# Patient Record
Sex: Female | Born: 1965 | Race: White | Hispanic: No | Marital: Married | Smoking: Never smoker
Health system: Southern US, Community
[De-identification: ages and names within clinical notes are randomized; demographics above are authoritative.]

## PROBLEM LIST (undated history)

## (undated) DIAGNOSIS — R1011 Right upper quadrant pain: Secondary | ICD-10-CM

## (undated) DIAGNOSIS — K52832 Lymphocytic colitis: Secondary | ICD-10-CM

## (undated) DIAGNOSIS — F329 Major depressive disorder, single episode, unspecified: Secondary | ICD-10-CM

## (undated) DIAGNOSIS — C801 Malignant (primary) neoplasm, unspecified: Secondary | ICD-10-CM

## (undated) DIAGNOSIS — F419 Anxiety disorder, unspecified: Secondary | ICD-10-CM

## (undated) DIAGNOSIS — I10 Essential (primary) hypertension: Secondary | ICD-10-CM

## (undated) DIAGNOSIS — Z9289 Personal history of other medical treatment: Secondary | ICD-10-CM

## (undated) DIAGNOSIS — M1990 Unspecified osteoarthritis, unspecified site: Secondary | ICD-10-CM

## (undated) DIAGNOSIS — D649 Anemia, unspecified: Secondary | ICD-10-CM

## (undated) DIAGNOSIS — K219 Gastro-esophageal reflux disease without esophagitis: Secondary | ICD-10-CM

## (undated) DIAGNOSIS — N6019 Diffuse cystic mastopathy of unspecified breast: Secondary | ICD-10-CM

## (undated) DIAGNOSIS — B019 Varicella without complication: Secondary | ICD-10-CM

## (undated) DIAGNOSIS — R011 Cardiac murmur, unspecified: Secondary | ICD-10-CM

## (undated) DIAGNOSIS — N920 Excessive and frequent menstruation with regular cycle: Secondary | ICD-10-CM

## (undated) DIAGNOSIS — D259 Leiomyoma of uterus, unspecified: Secondary | ICD-10-CM

## (undated) DIAGNOSIS — F32A Depression, unspecified: Secondary | ICD-10-CM

## (undated) HISTORY — PX: WISDOM TOOTH EXTRACTION: SHX21

## (undated) HISTORY — DX: Anemia, unspecified: D64.9

## (undated) HISTORY — DX: Depression, unspecified: F32.A

## (undated) HISTORY — DX: Leiomyoma of uterus, unspecified: D25.9

## (undated) HISTORY — DX: Diffuse cystic mastopathy of unspecified breast: N60.19

## (undated) HISTORY — DX: Cardiac murmur, unspecified: R01.1

## (undated) HISTORY — DX: Major depressive disorder, single episode, unspecified: F32.9

## (undated) HISTORY — PX: DILATION AND CURETTAGE OF UTERUS: SHX78

## (undated) HISTORY — DX: Excessive and frequent menstruation with regular cycle: N92.0

## (undated) HISTORY — DX: Anxiety disorder, unspecified: F41.9

## (undated) HISTORY — DX: Varicella without complication: B01.9

## (undated) HISTORY — DX: Essential (primary) hypertension: I10

## (undated) HISTORY — PX: HYSTEROSCOPY: SHX211

## (undated) HISTORY — DX: Personal history of other medical treatment: Z92.89

---
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
## 2003-05-18 HISTORY — PX: BREAST EXCISIONAL BIOPSY: SUR124

## 2006-05-17 HISTORY — PX: ABLATION: SHX5711

## 2006-12-22 ENCOUNTER — Ambulatory Visit: Payer: Self-pay | Admitting: Unknown Physician Specialty

## 2008-11-06 ENCOUNTER — Ambulatory Visit: Payer: Self-pay | Admitting: Family

## 2009-05-17 DIAGNOSIS — N6019 Diffuse cystic mastopathy of unspecified breast: Secondary | ICD-10-CM

## 2009-05-17 HISTORY — DX: Diffuse cystic mastopathy of unspecified breast: N60.19

## 2011-08-25 HISTORY — PX: COLONOSCOPY: SHX174

## 2014-05-27 ENCOUNTER — Encounter: Payer: Self-pay | Admitting: General Surgery

## 2014-06-06 ENCOUNTER — Ambulatory Visit: Payer: Self-pay

## 2014-06-17 ENCOUNTER — Encounter: Payer: Self-pay | Admitting: General Surgery

## 2014-06-17 ENCOUNTER — Ambulatory Visit (INDEPENDENT_AMBULATORY_CARE_PROVIDER_SITE_OTHER): Payer: BC Managed Care – PPO | Admitting: General Surgery

## 2014-06-17 VITALS — BP 122/74 | HR 76 | Resp 12 | Ht 62.0 in | Wt 116.0 lb

## 2014-06-17 DIAGNOSIS — N6001 Solitary cyst of right breast: Secondary | ICD-10-CM

## 2014-06-17 NOTE — Progress Notes (Signed)
Patient ID: Carly Roberts, female   DOB: 1965-11-18, 49 y.o.   MRN: [id number]  Chief Complaint  Patient presents with  . Follow-up    mammogram    HPI Carly Roberts is a 49 y.o. female  Here for palpated lump in right breast that she first noticed in December. It seems to be decreasing in size. She reports some occasional  mild discomfort, primarily with direct pressure.  This has not interfered with her activity.   Her last mammogram was done 06/06/14, Cat 2.   HPI  No past medical history on file.  Past Surgical History  Procedure Laterality Date  . Ablation  2007  . Breast biopsy Right 2005    Fibroadenoma, duct adenoma, right breast or o'clock.    No family history on file.  Social History History  Substance Use Topics  . Smoking status: Never Smoker   . Smokeless tobacco: Never Used  . Alcohol Use: No    Allergies  Allergen Reactions  . Sulfa Antibiotics Rash    Current Outpatient Prescriptions  Medication Sig Dispense Refill  . b complex vitamins tablet Take 1 tablet by mouth daily.    . Multiple Minerals-Vitamins (CALCIUM & VIT D3 BONE HEALTH PO) Take by mouth.    . Probiotic Product (PROBIOTIC DAILY PO) Take by mouth.     No current facility-administered medications for this visit.    Review of Systems Review of Systems  Constitutional: Negative.   Respiratory: Negative.   Cardiovascular: Negative.     Blood pressure 122/74, pulse 76, resp. rate 12, height 5\' 2"  (1.575 m), weight 116 lb (52.617 kg).  Physical Exam Physical Exam  Constitutional: She is oriented to person, place, and time. She appears well-developed and well-nourished.  Eyes: Conjunctivae are normal. No scleral icterus.  Neck: Neck supple.  Cardiovascular: Normal rate, regular rhythm and normal heart sounds.   Pulmonary/Chest: Effort normal and breath sounds normal. Right breast exhibits no inverted nipple, no mass, no nipple discharge, no skin change and no tenderness. Left  breast exhibits no inverted nipple, no mass, no nipple discharge, no skin change and no tenderness.    Right breast well healed circumreal incision from 4-7 o'clock. Right breast little thickening from 11-1 o'clock from the areola.    Lymphadenopathy:    She has no cervical adenopathy.  Neurological: She is alert and oriented to person, place, and time.    Data Reviewed  PCP notes of 05/24/2014 from [facility],  Diagnostic mammograms dated 06/06/2014 shows a well-circumscribed mass in the right breast with another less dominant masses noted bilaterally.  Ultrasound examination of the right breast with the largest measuring 3.3 x 1.7 x 3.1 cm in the retroareolar area of the right breast. BI-RADS-2-2.  Assessment    Breast cyst, minimally symptomatic.    Plan    Options for management reviewed: 1) observation versus 2) aspiration. As she is experienced gradual improvement in its size and discomfort over the last month option 1 is chosen.  She was encouraged to call if the area becomes more uncomfortable or changes significantly in size. Follow up otherwise will be on an as-needed basis.    Ref: [facility]/ Dr Kenton Kingfisher PCP: None  [name] 06/18/2014, 11:58 AM

## 2014-06-17 NOTE — Patient Instructions (Addendum)
Continue self breast exams. Call office for any new breast issues or concerns.Breast Cyst A breast cyst is a sac in the breast that is filled with fluid. Breast cysts are common in women. Women can have one or many cysts. When the breasts contain many cysts, it is usually due to a noncancerous (benign) condition called fibrocystic change. These lumps form under the influence of female hormones (estrogen and progesterone). The lumps are most often located in the upper, outer portion of the breast. They are often more swollen, painful, and tender before your period starts. They usually disappear after menopause, unless you are on hormone therapy.  There are several types of cysts:  Macrocyst. This is a cyst that is about 2 in. (5.1 cm) in diameter.   Microcyst. This is a tiny cyst that you cannot feel but can be seen with a mammogram or an ultrasound.   Galactocele. This is a cyst containing milk that may develop if you suddenly stop breastfeeding.   Sebaceous cyst of the skin. This type of cyst is not in the breast tissue itself. Breast cysts do not increase your risk of breast cancer. However, they must be monitored closely because they can be cancerous.  CAUSES  It is not known exactly what causes a breast cyst to form. Possible causes include:  An overgrowth of milk glands and connective tissue in the breast can block the milk glands, causing them to fill with fluid.   Scar tissue in the breast from previous surgery may block the glands, causing a cyst.  RISK FACTORS Estrogen may influence the development of a breast cyst.  SIGNS AND SYMPTOMS   Feeling a smooth, round, soft lump (like a grape) in the breast that is easily moveable.   Breast discomfort or pain.  Increase in size of the lump before your menstrual period and decrease in its size after your menstrual period.  DIAGNOSIS  A cyst can be felt during a physical exam by your health care provider. A breast X-ray exam  (mammogram) and ultrasonography will be done to confirm the diagnosis. Fluid may be removed from the cyst with a needle (fine needle aspiration) to make sure the cyst is not cancerous.  TREATMENT  Treatment may not be necessary. Your health care provider may monitor the cyst to see if it goes away on its own. If treatment is needed, it may include:  Hormone treatment.   Needle aspiration. There is a chance of the cyst coming back after aspiration.   Surgery to remove the whole cyst.  HOME CARE INSTRUCTIONS   Keep all follow-up appointments with your health care provider.  See your health care provider regularly:  Get a yearly exam by your health care provider.  Have a clinical breast exam by a health care provider every 1-3 years if you are 75-78 years of age. After age 63 years, you should have the exam every year.   Get mammogram tests as directed by your health care provider.   Understand the normal appearance and feel of your breasts and perform breast self-exams.   Only take over-the-counter or prescription medicines as directed by your health care provider.   Wear a supportive bra, especially when exercising.   Avoid caffeine.   Reduce your salt intake, especially before your menstrual period. Too much salt can cause fluid retention, breast swelling, and discomfort.  SEEK MEDICAL CARE IF:   You feel, or think you feel, a lump in your breast.   You notice  that both breasts look or feel different than usual.   Your breast is still causing pain after your menstrual period is over.   You need medicine for breast pain and swelling that occurs with your menstrual period.  SEEK IMMEDIATE MEDICAL CARE IF:   You have severe pain, tenderness, redness, or warmth in your breast.   You have nipple discharge or bleeding.   Your breast lump becomes hard and painful.   You find new lumps or bumps that were not there before.   You feel lumps in your armpit  (axilla).   You notice dimpling or wrinkling of the breast or nipple.   You have a fever.  MAKE SURE YOU:  Understand these instructions.  Will watch your condition.  Will get help right away if you are not doing well or get worse. Document Released: 05/03/2005 Document Revised: 01/03/2013 Document Reviewed: 11/30/2012 [facility] Patient Information 2015 [place], [place]. This information is not intended to replace advice given to you by your health care provider. Make sure you discuss any questions you have with your health care provider.

## 2014-06-18 ENCOUNTER — Encounter: Payer: Self-pay | Admitting: General Surgery

## 2014-06-18 DIAGNOSIS — N6009 Solitary cyst of unspecified breast: Secondary | ICD-10-CM | POA: Insufficient documentation

## 2015-05-27 DIAGNOSIS — Z9289 Personal history of other medical treatment: Secondary | ICD-10-CM

## 2015-05-27 HISTORY — DX: Personal history of other medical treatment: Z92.89

## 2015-12-23 ENCOUNTER — Encounter: Payer: Self-pay | Admitting: *Deleted

## 2015-12-30 ENCOUNTER — Ambulatory Visit (INDEPENDENT_AMBULATORY_CARE_PROVIDER_SITE_OTHER): Payer: BC Managed Care – PPO | Admitting: General Surgery

## 2015-12-30 ENCOUNTER — Other Ambulatory Visit: Payer: Self-pay

## 2015-12-30 ENCOUNTER — Encounter: Payer: Self-pay | Admitting: General Surgery

## 2015-12-30 VITALS — BP 98/60 | HR 60 | Resp 12 | Ht 62.0 in | Wt 113.0 lb

## 2015-12-30 DIAGNOSIS — N6001 Solitary cyst of right breast: Secondary | ICD-10-CM

## 2015-12-30 DIAGNOSIS — N6002 Solitary cyst of left breast: Secondary | ICD-10-CM

## 2015-12-30 NOTE — Progress Notes (Signed)
Patient ID: Carly Roberts, female   DOB: 18-Sep-1965, 50 y.o.   MRN: [id number]  Chief Complaint  Patient presents with  . Follow-up    breast cyst    HPI Carly Roberts is a 50 y.o. female.  Here today for a follow up right breast cyst. She states she can feel another cyst in the left breast as well. She states the right breast tenderness/pain would come and go but for the past several months seems to be more constant. Occasion shooting pains for several weeks. Both areas are located on the top portion of the breast. Denies change in diet or caffeine intake.   HPI  No past medical history on file.  Past Surgical History:  Procedure Laterality Date  . ABLATION  2007  . BREAST BIOPSY Right 2005   Fibroadenoma, duct adenoma, right breast or o'clock.    No family history on file.  Social History Social History  Substance Use Topics  . Smoking status: Never Smoker  . Smokeless tobacco: Never Used  . Alcohol use No    Allergies  Allergen Reactions  . Sulfa Antibiotics Rash    Current Outpatient Prescriptions  Medication Sig Dispense Refill  . b complex vitamins tablet Take 1 tablet by mouth daily.    . Multiple Minerals-Vitamins (CALCIUM & VIT D3 BONE HEALTH PO) Take by mouth.    . Probiotic Product (PROBIOTIC DAILY PO) Take by mouth.     No current facility-administered medications for this visit.     Review of Systems Review of Systems  Constitutional: Negative.   Respiratory: Negative.   Cardiovascular: Negative.     Blood pressure 98/60, pulse 60, resp. rate 12, height 5\' 2"  (1.575 m), weight 113 lb (51.3 kg).  Physical Exam Physical Exam  Constitutional: She is oriented to person, place, and time. She appears well-developed and well-nourished.  HENT:  Mouth/Throat: Oropharynx is clear and moist.  Eyes: Conjunctivae are normal. No scleral icterus.  Neck: Neck supple.  Cardiovascular: Normal rate, regular rhythm and normal heart sounds.    Pulmonary/Chest: Effort normal and breath sounds normal. Right breast exhibits mass. Right breast exhibits no inverted nipple, no nipple discharge, no skin change and no tenderness. Left breast exhibits mass. Left breast exhibits no inverted nipple, no nipple discharge, no skin change and no tenderness.    1.5 left upper inner quadrant and a 3 cm cyst right upper outer quadrant.  Lymphadenopathy:    She has no cervical adenopathy.  Neurological: She is alert and oriented to person, place, and time.  Skin: Skin is warm and dry.  Psychiatric: Her behavior is normal.    Data Reviewed Ultrasound examination was completed of both breasts. The right breast was evaluated first with the most dominant lesion is noted. In the 10:00 position 3 cm from the nipple a 2.46 x 2.47 x 2.98 cm simple cyst is identified. He was anechoic with strong posterior acoustic enhancement and smooth borders. Adjacent to this is a 0.95 x 1.03 x 1.9 cm stick lesion. This is essentially anechoic. Slightly lobulated borders. Posterior acoustic enhancement is noted. Closer to the chest wall the same location is a 1.6 x 1.6 cm simple cyst adjacent to the pectoralis fascia. BI-RADS-2.   The patient was amenable to aspiration due to local discomfort. 1 mL of 1% plain Xylocaine was used area all lesions were aspirated with complete resolution. The fluid was discarded. The procedure was well tolerated.  Examination of the left breast showed a 0.8  x 2.4 x 2.6 cm softly lobulated cyst with mild posterior acoustic enhancement and smooth borders at the 12:00 position. Adjacent to this and more superficially located were multiple cysts. These were smaller in diameter. The palpable lesion at the 10:00 position, 4 cm from the nipple showed a 1.56 x 2.51 x 2.79 dumbbell shaped lesion with posterior acoustic enhancement abutting the pectoralis fascia. [place] adjacent to this at the 3 cm mark and at 10:00 position was a 0.88 x 1.33 x 1.54 cm softly  lobulated mass with faint posterior acoustic enhancement and smooth borders. His were all consistent with simple cyst. BI-RADS-2.  The patient was amenable to aspiration due to local discomfort. Each lesion was aspirated with complete resolution. The fluid was discarded and the procedure was well tolerated.    Assessment     Bilateral breast cysts, symptomatic. Resolved on aspiration.    Plan    The use of antioxidant vitamins was reviewed. Protegra or Ocuvite, twice a day for 3 months. This may help minimize cyst recurrence.    Follow up in 3 months   This information has been scribed by Karie Fetch RN, BSN,BC.  [name] 12/31/2015, 8:00 PM

## 2015-12-31 HISTORY — PX: BREAST CYST ASPIRATION: SHX578

## 2016-02-11 ENCOUNTER — Encounter: Payer: Self-pay | Admitting: *Deleted

## 2016-02-14 LAB — LIPID PANEL
CHOLESTEROL: 209 mg/dL — AB (ref 0–200)
HDL: 42 mg/dL (ref 35–70)
LDL Cholesterol: 134 mg/dL
TRIGLYCERIDES: 164 mg/dL — AB (ref 40–160)

## 2016-02-14 LAB — HEMOGLOBIN A1C: Hemoglobin A1C: 5.6

## 2016-02-14 LAB — TSH: TSH: 1.42 u[IU]/mL (ref 0.41–5.90)

## 2016-03-03 ENCOUNTER — Encounter (INDEPENDENT_AMBULATORY_CARE_PROVIDER_SITE_OTHER): Payer: Self-pay

## 2016-03-03 ENCOUNTER — Encounter: Payer: Self-pay | Admitting: Family Medicine

## 2016-03-03 ENCOUNTER — Ambulatory Visit (INDEPENDENT_AMBULATORY_CARE_PROVIDER_SITE_OTHER): Payer: BC Managed Care – PPO | Admitting: Family Medicine

## 2016-03-03 VITALS — BP 134/76 | HR 51 | Temp 98.3°F | Ht 62.25 in | Wt 110.1 lb

## 2016-03-03 DIAGNOSIS — F419 Anxiety disorder, unspecified: Secondary | ICD-10-CM

## 2016-03-03 DIAGNOSIS — F418 Other specified anxiety disorders: Secondary | ICD-10-CM | POA: Diagnosis not present

## 2016-03-03 DIAGNOSIS — R490 Dysphonia: Secondary | ICD-10-CM | POA: Insufficient documentation

## 2016-03-03 DIAGNOSIS — L659 Nonscarring hair loss, unspecified: Secondary | ICD-10-CM | POA: Insufficient documentation

## 2016-03-03 DIAGNOSIS — F329 Major depressive disorder, single episode, unspecified: Secondary | ICD-10-CM | POA: Insufficient documentation

## 2016-03-03 DIAGNOSIS — F32A Depression, unspecified: Secondary | ICD-10-CM

## 2016-03-03 MED ORDER — METHYLPREDNISOLONE ACETATE 80 MG/ML IJ SUSP: 80.0000 mg | Freq: Once | INTRAMUSCULAR | Status: DC

## 2016-03-03 MED ORDER — SERTRALINE HCL 50 MG PO TABS: 50.0000 mg | ORAL_TABLET | Freq: Every day | ORAL | 1 refills | Status: DC

## 2016-03-03 NOTE — Assessment & Plan Note (Signed)
New problem (to me). Starting Zoloft. Offered psychology referral and patient declined.

## 2016-03-03 NOTE — Progress Notes (Signed)
Pre visit review using our clinic review tool, if applicable. No additional management support is needed unless otherwise documented below in the visit note. 

## 2016-03-03 NOTE — Assessment & Plan Note (Signed)
New problem. 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  If persists, will send to ENT for laryngoscopy.

## 2016-03-03 NOTE — Patient Instructions (Addendum)
Take the Zoloft daily.  Follow up in 6 weeks.  Let me know if the voice issues/hoarsness continues and we will have you see ENT.  Take care  Dr. Lacinda Axon   Health Maintenance, Female Adopting a healthy lifestyle and getting preventive care can go a long way to promote health and wellness. Talk with your health care provider about what schedule of regular examinations is right for you. This is a good chance for you to check in with your provider about disease prevention and staying healthy. In between checkups, there are plenty of things you can do on your own. Experts have done a lot of research about which lifestyle changes and preventive measures are most likely to keep you healthy. Ask your health care provider for more information. WEIGHT AND DIET  Eat a healthy diet  Be sure to include plenty of vegetables, fruits, low-fat dairy products, and lean protein.  Do not eat a lot of foods high in solid fats, added sugars, or salt.  Get regular exercise. This is one of the most important things you can do for your health.  Most adults should exercise for at least 150 minutes each week. The exercise should increase your heart rate and make you sweat (moderate-intensity exercise).  Most adults should also do strengthening exercises at least twice a week. This is in addition to the moderate-intensity exercise.  Maintain a healthy weight  Body mass index (BMI) is a measurement that can be used to identify possible weight problems. It estimates body fat based on height and weight. Your health care provider can help determine your BMI and help you achieve or maintain a healthy weight.  For females 13 years of age and older:   A BMI below 18.5 is considered underweight.  A BMI of 18.5 to 24.9 is normal.  A BMI of 25 to 29.9 is considered overweight.  A BMI of 30 and above is considered obese.  Watch levels of cholesterol and blood lipids  You should start having your blood tested for  lipids and cholesterol at 48 years of age, then have this test every 5 years.  You may need to have your cholesterol levels checked more often if:  Your lipid or cholesterol levels are high.  You are older than 48 years of age.  You are at high risk for heart disease.  CANCER SCREENING   Lung Cancer  Lung cancer screening is recommended for adults 35-64 years old who are at high risk for lung cancer because of a history of smoking.  A yearly low-dose CT scan of the lungs is recommended for people who:  Currently smoke.  Have quit within the past 15 years.  Have at least a 30-pack-year history of smoking. A pack year is smoking an average of one pack of cigarettes a day for 1 year.  Yearly screening should continue until it has been 15 years since you quit.  Yearly screening should stop if you develop a health problem that would prevent you from having lung cancer treatment.  Breast Cancer  Practice breast self-awareness. This means understanding how your breasts normally appear and feel.  It also means doing regular breast self-exams. Let your health care provider know about any changes, no matter how small.  If you are in your 20s or 30s, you should have a clinical breast exam (CBE) by a health care provider every 1-3 years as part of a regular health exam.  If you are 69 or older, have a  CBE every year. Also consider having a breast X-ray (mammogram) every year.  If you have a family history of breast cancer, talk to your health care provider about genetic screening.  If you are at high risk for breast cancer, talk to your health care provider about having an MRI and a mammogram every year.  Breast cancer gene (BRCA) assessment is recommended for women who have family members with BRCA-related cancers. BRCA-related cancers include:  Breast.  Ovarian.  Tubal.  Peritoneal cancers.  Results of the assessment will determine the need for genetic counseling and BRCA1  and BRCA2 testing. Cervical Cancer Your health care provider may recommend that you be screened regularly for cancer of the pelvic organs (ovaries, uterus, and vagina). This screening involves a pelvic examination, including checking for microscopic changes to the surface of your cervix (Pap test). You may be encouraged to have this screening done every 3 years, beginning at age 21.  For women ages 30-65, health care providers may recommend pelvic exams and Pap testing every 3 years, or they may recommend the Pap and pelvic exam, combined with testing for human papilloma virus (HPV), every 5 years. Some types of HPV increase your risk of cervical cancer. Testing for HPV may also be done on women of any age with unclear Pap test results.  Other health care providers may not recommend any screening for nonpregnant women who are considered low risk for pelvic cancer and who do not have symptoms. Ask your health care provider if a screening pelvic exam is right for you.  If you have had past treatment for cervical cancer or a condition that could lead to cancer, you need Pap tests and screening for cancer for at least 20 years after your treatment. If Pap tests have been discontinued, your risk factors (such as having a new sexual partner) need to be reassessed to determine if screening should resume. Some women have medical problems that increase the chance of getting cervical cancer. In these cases, your health care provider may recommend more frequent screening and Pap tests. Colorectal Cancer  This type of cancer can be detected and often prevented.  Routine colorectal cancer screening usually begins at 48 years of age and continues through 48 years of age.  Your health care provider may recommend screening at an earlier age if you have risk factors for colon cancer.  Your health care provider may also recommend using home test kits to check for hidden blood in the stool.  A small camera at the  end of a tube can be used to examine your colon directly (sigmoidoscopy or colonoscopy). This is done to check for the earliest forms of colorectal cancer.  Routine screening usually begins at age 50.  Direct examination of the colon should be repeated every 5-10 years through 48 years of age. However, you may need to be screened more often if early forms of precancerous polyps or small growths are found. Skin Cancer  Check your skin from head to toe regularly.  Tell your health care provider about any new moles or changes in moles, especially if there is a change in a mole's shape or color.  Also tell your health care provider if you have a mole that is larger than the size of a pencil eraser.  Always use sunscreen. Apply sunscreen liberally and repeatedly throughout the day.  Protect yourself by wearing long sleeves, pants, a wide-brimmed hat, and sunglasses whenever you are outside. HEART DISEASE, DIABETES, AND HIGH BLOOD   PRESSURE   High blood pressure causes heart disease and increases the risk of stroke. High blood pressure is more likely to develop in:  People who have blood pressure in the high end of the normal range (130-139/85-89 mm Hg).  People who are overweight or obese.  People who are African American.  If you are 18-39 years of age, have your blood pressure checked every 3-5 years. If you are 40 years of age or older, have your blood pressure checked every year. You should have your blood pressure measured twice--once when you are at a hospital or clinic, and once when you are not at a hospital or clinic. Record the average of the two measurements. To check your blood pressure when you are not at a hospital or clinic, you can use:  An automated blood pressure machine at a pharmacy.  A home blood pressure monitor.  If you are between 55 years and 79 years old, ask your health care provider if you should take aspirin to prevent strokes.  Have regular diabetes  screenings. This involves taking a blood sample to check your fasting blood sugar level.  If you are at a normal weight and have a low risk for diabetes, have this test once every three years after 48 years of age.  If you are overweight and have a high risk for diabetes, consider being tested at a younger age or more often. PREVENTING INFECTION  Hepatitis B  If you have a higher risk for hepatitis B, you should be screened for this virus. You are considered at high risk for hepatitis B if:  You were born in a country where hepatitis B is common. Ask your health care provider which countries are considered high risk.  Your parents were born in a high-risk country, and you have not been immunized against hepatitis B (hepatitis B vaccine).  You have HIV or AIDS.  You use needles to inject street drugs.  You live with someone who has hepatitis B.  You have had sex with someone who has hepatitis B.  You get hemodialysis treatment.  You take certain medicines for conditions, including cancer, organ transplantation, and autoimmune conditions. Hepatitis C  Blood testing is recommended for:  Everyone born from 1945 through 1965.  Anyone with known risk factors for hepatitis C. Sexually transmitted infections (STIs)  You should be screened for sexually transmitted infections (STIs) including gonorrhea and chlamydia if:  You are sexually active and are younger than 48 years of age.  You are older than 48 years of age and your health care provider tells you that you are at risk for this type of infection.  Your sexual activity has changed since you were last screened and you are at an increased risk for chlamydia or gonorrhea. Ask your health care provider if you are at risk.  If you do not have HIV, but are at risk, it may be recommended that you take a prescription medicine daily to prevent HIV infection. This is called pre-exposure prophylaxis (PrEP). You are considered at risk  if:  You are sexually active and do not regularly use condoms or know the HIV status of your partner(s).  You take drugs by injection.  You are sexually active with a partner who has HIV. Talk with your health care provider about whether you are at high risk of being infected with HIV. If you choose to begin PrEP, you should first be tested for HIV. You should then be tested every 3   months for as long as you are taking PrEP.  PREGNANCY   If you are premenopausal and you may become pregnant, ask your health care provider about preconception counseling.  If you may become pregnant, take 400 to 800 micrograms (mcg) of folic acid every day.  If you want to prevent pregnancy, talk to your health care provider about birth control (contraception). OSTEOPOROSIS AND MENOPAUSE   Osteoporosis is a disease in which the bones lose minerals and strength with aging. This can result in serious bone fractures. Your risk for osteoporosis can be identified using a bone density scan.  If you are 47 years of age or older, or if you are at risk for osteoporosis and fractures, ask your health care provider if you should be screened.  Ask your health care provider whether you should take a calcium or vitamin D supplement to lower your risk for osteoporosis.  Menopause may have certain physical symptoms and risks.  Hormone replacement therapy may reduce some of these symptoms and risks. Talk to your health care provider about whether hormone replacement therapy is right for you.  HOME CARE INSTRUCTIONS   Schedule regular health, dental, and eye exams.  Stay current with your immunizations.   Do not use any tobacco products including cigarettes, chewing tobacco, or electronic cigarettes.  If you are pregnant, do not drink alcohol.  If you are breastfeeding, limit how much and how often you drink alcohol.  Limit alcohol intake to no more than 1 drink per day for nonpregnant women. One drink equals 12  ounces of beer, 5 ounces of wine, or 1 ounces of hard liquor.  Do not use street drugs.  Do not share needles.  Ask your health care provider for help if you need support or information about quitting drugs.  Tell your health care provider if you often feel depressed.  Tell your health care provider if you have ever been abused or do not feel safe at home.   This information is not intended to replace advice given to you by your health care provider. Make sure you discuss any questions you have with your health care provider.   Document Released: 11/16/2010 Document Revised: 05/24/2014 Document Reviewed: 04/04/2013 Elsevier Interactive Patient Education [employer].

## 2016-03-03 NOTE — Progress Notes (Addendum)
Subjective:  Patient ID: Carly Roberts, female    DOB: 1966-01-25  Age: 50 y.o. MRN: [id number]  CC: Hair loss, Depression/anxiety, Voice change/hoarseness  HPI Carly Roberts is a 50 y.o. female presents to the clinic today as a new patient with the above concerns.  Hair loss  Patient reports that she has been recently having hair loss.  No focal areas of hair loss.  She states that she seems to be losing more hair. She has noted this with an increase in volume in hair on her brush as well as in the drain.  She has discussed this with her OB/GYN and thyroid studies were obtained. Thyroid studies normal.  She is currently taking biotin for this.  Of note, she does note recent stressors.  Depression/Anxiety  Patient reports a history of depression/anxiety.  She states that she has recently been going through a lot.  She is went through a divorce and has 2 children that are both out of the house.  She also has some personal issues at home as well.  She states that she feels like she is not coping very well.  He states that she is not seem to be able to handle the stress and making her feel depressed.  She would like to discuss treatment options today.  Voice change/hoarseness.  Patient states that she has been experiencing hoarseness/change in her voice at work.  She states that it tends to happen after she has to raise her voice or speak loudly.  She states that it is often associated with a cough.  No reports of reflux.  No known exacerbating or relieving factors.  No other associated symptoms.  No other complaints this time.  PMH, Surgical Hx, Family Hx, Social History reviewed and updated as below.  Past Medical History:  Diagnosis Date  . Chicken pox   . Depression   . Heart murmur   . Hypertension    Past Surgical History:  Procedure Laterality Date  . ABLATION  2007  . BREAST BIOPSY Right 2005   Fibroadenoma, duct adenoma, right breast or  o'clock.   Family History  Problem Relation Age of Onset  . Multiple sclerosis Mother   . Hypothyroidism Mother   . Hyperlipidemia Father   . Heart disease Father   . Hypertension Father   . Lung cancer Maternal Aunt   . Alcohol abuse Maternal Uncle   . Stroke Maternal Grandmother   . Hypertension Maternal Grandmother   . Kidney disease Maternal Grandmother   . Diabetes Maternal Grandmother   . Prostate cancer Paternal Grandfather   . Hypertension Paternal Grandfather    Social History  Substance Use Topics  . Smoking status: Never Smoker  . Smokeless tobacco: Never Used  . Alcohol use No    Review of Systems  Constitutional: Positive for fatigue.  HENT: Positive for voice change.   Eyes: Positive for visual disturbance.  Gastrointestinal: Positive for constipation.  Genitourinary:       Breast cysts.  Musculoskeletal:       Leg cramps.  Skin:       Hair loss.   Psychiatric/Behavioral:       Sadness, anxiety, stress.   All other systems reviewed and are negative.   Objective:   Today's Vitals: BP 134/76 (BP Location: Right Arm, Patient Position: Sitting, Cuff Size: Normal)   Pulse (!) 51   Temp 98.3 F (36.8 C) (Oral)   Ht 5' 2.25" (1.581 m)   Wt 110  lb 2 oz (50 kg)   SpO2 99%   BMI 19.98 kg/m   Physical Exam  Constitutional: She is oriented to person, place, and time. She appears well-developed and well-nourished. No distress.  HENT:  Head: Normocephalic and atraumatic.  Nose: Nose normal.  Mouth/Throat: Oropharynx is clear and moist. No oropharyngeal exudate.  Normal TM's bilaterally.   Eyes: Conjunctivae are normal. No scleral icterus.  Neck: Neck supple. No thyromegaly present.  Cardiovascular: Normal rate and regular rhythm.   No murmur heard. Pulmonary/Chest: Effort normal and breath sounds normal. She has no wheezes. She has no rales.  Abdominal: Soft. She exhibits no distension. There is no tenderness. There is no rebound and no guarding.    Musculoskeletal: Normal range of motion. She exhibits no edema.  Lymphadenopathy:    She has no cervical adenopathy.  Neurological: She is alert and oriented to person, place, and time.  Skin: Skin is warm and dry. No rash noted.  Psychiatric:  Flat affect, depressed mood. Tearful during history.  Vitals reviewed.  Assessment & Plan:   Problem List Items Addressed This Visit    Hoarseness    New problem. 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  If persists, will send to ENT for laryngoscopy.      Hair loss - Primary    New problem. Exam unremarkable. No appreciable evidence of hair loss. Continue Biotin.       Anxiety and depression    New problem (to me). Starting Zoloft. Offered psychology referral and patient declined.         Other Visit Diagnoses   None.     Outpatient Encounter Prescriptions as of 03/03/2016  Medication Sig  . b complex vitamins tablet Take 1 tablet by mouth daily.  Marland Kitchen BIOTIN PO Take by mouth.  . Multiple Minerals-Vitamins (CALCIUM & VIT D3 BONE HEALTH PO) Take by mouth.  . Multiple Vitamins-Minerals (OCUVITE EXTRA PO) Take by mouth.  . vitamin B-12 (CYANOCOBALAMIN) 1000 MCG tablet Take 3,000 mcg by mouth daily.  . Wheat Dextrin (BENEFIBER PO) Take by mouth.  . sertraline (ZOLOFT) 50 MG tablet Take 1 tablet (50 mg total) by mouth daily.  . [DISCONTINUED] Probiotic Product (PROBIOTIC DAILY PO) Take by mouth.  . [DISCONTINUED] methylPREDNISolone acetate (DEPO-MEDROL) injection 80 mg    No facility-administered encounter medications on file as of 03/03/2016.     Follow-up: 6 weeks.   [place]

## 2016-03-03 NOTE — Assessment & Plan Note (Signed)
New problem. Exam unremarkable. No appreciable evidence of hair loss. Continue Biotin.

## 2016-04-06 ENCOUNTER — Ambulatory Visit (INDEPENDENT_AMBULATORY_CARE_PROVIDER_SITE_OTHER): Payer: BC Managed Care – PPO | Admitting: General Surgery

## 2016-04-06 ENCOUNTER — Encounter: Payer: Self-pay | Admitting: General Surgery

## 2016-04-06 VITALS — BP 102/66 | Resp 12 | Ht 62.0 in | Wt 109.0 lb

## 2016-04-06 DIAGNOSIS — N6001 Solitary cyst of right breast: Secondary | ICD-10-CM

## 2016-04-06 NOTE — Progress Notes (Signed)
Patient ID: Carly Roberts, female   DOB: 1965/07/18, 50 y.o.   MRN: [id number]  Chief Complaint  Patient presents with  . Follow-up    HPI Carly Roberts is a 50 y.o. female.  Here today for follow up  right breast cyst. She states she can not feel the cyst since it was aspirated at last visit. Denies any pain.  HPI  Past Medical History:  Diagnosis Date  . Chicken pox   . Depression   . Heart murmur   . Hypertension     Past Surgical History:  Procedure Laterality Date  . ABLATION  2007  . BREAST BIOPSY Right 2005   Fibroadenoma, duct adenoma, right breast or o'clock.    Family History  Problem Relation Age of Onset  . Multiple sclerosis Mother   . Hypothyroidism Mother   . Hyperlipidemia Father   . Heart disease Father   . Hypertension Father   . Lung cancer Maternal Aunt   . Alcohol abuse Maternal Uncle   . Stroke Maternal Grandmother   . Hypertension Maternal Grandmother   . Kidney disease Maternal Grandmother   . Diabetes Maternal Grandmother   . Prostate cancer Paternal Grandfather   . Hypertension Paternal Grandfather     Social History Social History  Substance Use Topics  . Smoking status: Never Smoker  . Smokeless tobacco: Never Used  . Alcohol use No    Allergies  Allergen Reactions  . Sulfa Antibiotics Rash    Current Outpatient Prescriptions  Medication Sig Dispense Refill  . b complex vitamins tablet Take 1 tablet by mouth daily.    Marland Kitchen BIOTIN PO Take by mouth.    . Multiple Minerals-Vitamins (CALCIUM & VIT D3 BONE HEALTH PO) Take by mouth.    . Multiple Vitamins-Minerals (OCUVITE EXTRA PO) Take by mouth.    . sertraline (ZOLOFT) 50 MG tablet Take 1 tablet (50 mg total) by mouth daily. 90 tablet 1  . vitamin B-12 (CYANOCOBALAMIN) 1000 MCG tablet Take 3,000 mcg by mouth daily.    . Wheat Dextrin (BENEFIBER PO) Take by mouth.     No current facility-administered medications for this visit.     Review of Systems Review of Systems   Constitutional: Negative.   Respiratory: Negative.   Cardiovascular: Negative.     Blood pressure 102/66, resp. rate 12, height 5\' 2"  (1.575 m), weight 109 lb (49.4 kg).  Physical Exam Physical Exam  Constitutional: She is oriented to person, place, and time. She appears well-developed and well-nourished.  Cardiovascular: Normal rate, regular rhythm and normal heart sounds.   Pulmonary/Chest: Effort normal and breath sounds normal. Right breast exhibits no inverted nipple, no mass, no nipple discharge, no skin change and no tenderness. Left breast exhibits no inverted nipple, no mass, no nipple discharge, no skin change and no tenderness.    Lymphadenopathy:    She has no cervical adenopathy.    She has no axillary adenopathy.  Neurological: She is alert and oriented to person, place, and time.  Skin: Skin is warm and dry.  Psychiatric: Her behavior is normal.     Assessment    Benign breast exam.    Plan    The patient should have screening mammograms completed in January 2018.  She was encouraged to call if she appreciates any new lesions in her breast.   Return as needed.  This information has been scribed by Karie Fetch RN, BSN,BC.   Carly Roberts 04/07/2016, 7:12 AM

## 2016-04-06 NOTE — Patient Instructions (Signed)
The patient is aware to call back for any questions or concerns.  

## 2016-04-14 ENCOUNTER — Encounter: Payer: Self-pay | Admitting: Family Medicine

## 2016-04-14 ENCOUNTER — Ambulatory Visit (INDEPENDENT_AMBULATORY_CARE_PROVIDER_SITE_OTHER): Payer: BC Managed Care – PPO | Admitting: Family Medicine

## 2016-04-14 DIAGNOSIS — F418 Other specified anxiety disorders: Secondary | ICD-10-CM | POA: Diagnosis not present

## 2016-04-14 DIAGNOSIS — F329 Major depressive disorder, single episode, unspecified: Secondary | ICD-10-CM

## 2016-04-14 DIAGNOSIS — F419 Anxiety disorder, unspecified: Principal | ICD-10-CM

## 2016-04-14 NOTE — Patient Instructions (Signed)
I'm glad you're doing well.  If you decide to discontinuing taper as follows - 1/2 tablet x 1 week then discontinue.  Follow up annually.  Take care  Dr. Lacinda Axon

## 2016-04-14 NOTE — Assessment & Plan Note (Signed)
Improved. Continue Zoloft. Patient interested in discontinuing in the future. I gave her instructions on how to taper if desired.

## 2016-04-14 NOTE — Progress Notes (Signed)
   Subjective:  Patient ID: Carly Roberts, female    DOB: 17-Oct-1965  Age: 50 y.o. MRN: [id number]  CC: Follow up anxiety/depression  HPI:  50 year old female presents for follow-up regarding anxiety and depression.  Patient reports that she has improved dramatically after starting Zoloft. She is tolerating without difficulty. She had a headache initially but now has no side effects. She states there are still some life stressors but she feels like she is significantly improved. No other complaints or concerns at this time.  Social Hx   Social History   Social History  . Marital status: Divorced    Spouse name: N/A  . Number of children: N/A  . Years of education: N/A   Social History Main Topics  . Smoking status: Never Smoker  . Smokeless tobacco: Never Used  . Alcohol use No  . Drug use: No  . Sexual activity: Yes   Other Topics Concern  . None   Social History Narrative  . None   Review of Systems  Constitutional: Negative.   Psychiatric/Behavioral: The patient is nervous/anxious.    Objective:  BP (!) 94/58 (BP Location: Left Arm, Patient Position: Sitting, Cuff Size: Normal)   Pulse 69   Temp 98.2 F (36.8 C) (Oral)   Resp 10   Wt 108 lb 2 oz (49 kg)   SpO2 100%   BMI 19.78 kg/m   BP/Weight 04/14/2016 04/06/2016 [id number]  Systolic BP 94 [id number]  Diastolic BP 58 66 76  Wt. (Lbs) 108.13 109 110.13  BMI 19.78 19.94 19.98   Physical Exam  Constitutional: She is oriented to person, place, and time. She appears well-developed. No distress.  Cardiovascular: Normal rate and regular rhythm.   Pulmonary/Chest: Effort normal and breath sounds normal.  Neurological: She is alert and oriented to person, place, and time.  Psychiatric: She has a normal mood and affect.  Vitals reviewed.  Lab Results  Component Value Date   CHOL 209 (A) 02/14/2016   TRIG 164 (A) 02/14/2016   HDL 42 02/14/2016   LDLCALC 134 02/14/2016   TSH 1.42 02/14/2016   HGBA1C 5.6  02/14/2016    Assessment & Plan:   Problem List Items Addressed This Visit    Anxiety and depression    Improved. Continue Zoloft. Patient interested in discontinuing in the future. I gave her instructions on how to taper if desired.        Follow-up: Annually  [place]

## 2016-05-17 DIAGNOSIS — C8290 Follicular lymphoma, unspecified, unspecified site: Secondary | ICD-10-CM

## 2016-05-17 DIAGNOSIS — C801 Malignant (primary) neoplasm, unspecified: Secondary | ICD-10-CM

## 2016-05-17 HISTORY — DX: Malignant (primary) neoplasm, unspecified: C80.1

## 2016-05-17 HISTORY — PX: LYMPH NODE BIOPSY: SHX201

## 2016-05-17 HISTORY — DX: Follicular lymphoma, unspecified, unspecified site: C82.90

## 2016-11-03 ENCOUNTER — Encounter: Payer: Self-pay | Admitting: Certified Nurse Midwife

## 2016-11-03 ENCOUNTER — Ambulatory Visit (INDEPENDENT_AMBULATORY_CARE_PROVIDER_SITE_OTHER): Payer: BC Managed Care – PPO | Admitting: Certified Nurse Midwife

## 2016-11-03 VITALS — BP 100/60 | HR 47 | Ht 62.0 in | Wt 112.0 lb

## 2016-11-03 DIAGNOSIS — Z1231 Encounter for screening mammogram for malignant neoplasm of breast: Secondary | ICD-10-CM

## 2016-11-03 DIAGNOSIS — Z01419 Encounter for gynecological examination (general) (routine) without abnormal findings: Secondary | ICD-10-CM

## 2016-11-03 DIAGNOSIS — N6001 Solitary cyst of right breast: Secondary | ICD-10-CM

## 2016-11-03 DIAGNOSIS — N6002 Solitary cyst of left breast: Secondary | ICD-10-CM | POA: Diagnosis not present

## 2016-11-03 DIAGNOSIS — N912 Amenorrhea, unspecified: Secondary | ICD-10-CM | POA: Diagnosis not present

## 2016-11-03 DIAGNOSIS — Z1239 Encounter for other screening for malignant neoplasm of breast: Secondary | ICD-10-CM

## 2016-11-03 DIAGNOSIS — Z124 Encounter for screening for malignant neoplasm of cervix: Secondary | ICD-10-CM | POA: Diagnosis not present

## 2016-11-03 DIAGNOSIS — R232 Flushing: Secondary | ICD-10-CM

## 2016-11-03 NOTE — Progress Notes (Addendum)
Gynecology Annual Exam  PCP: Coral Spikes, DO  Chief Complaint:  Chief Complaint  Patient presents with  . Gynecologic Exam    History of Present Illness:Carly Roberts is a 51 year old Caucasian/White female , G 2 P 2 0 0 2 , who presents for her annual exam . She is having no significant GYN problems. She has a history of bilateral breat cysts. She had a palpable breast lump in her right breast at her last her last annual that was a breast cyst. She has felt breast masses in both breasts that wax and wane since her last exam. Had a breast aspiration of cysts in both breasts 12/2016 by Dr Bary Castilla as they were causing pain.  Her menses are absent due to an endometrial ablation in 2008. Has been feeling hot at times, but no  night sweats. FSH and LH last year were WNL. She has had no spotting.   The patient's past medical history is detailed in the past medical history section.  Since her last annual GYN exam dated 05/27/2015 , she was placed on Zoloft by her PCP for anxiety/depression. Her son who is at [facility] is currently in [place] x 8 weeks and then while be exploring [place]. Her daughter graduated from [employer] and is an occupational therapist..  She is not sexually active.  Her most recent pap smear was obtained 05/27/2015 and was NIL. Her most recent mammogram obtained on 01/ 02/2016 revealed multiple small cysts bilaterally and was Birads 2.   There is no family history of breast cancer.  There is no family history of ovarian cancer.  The patient does do  monthly self breast exams.  The patient does not smoke.  The patient drinks alcohol rarely. The patient does not use illegal drugs.  The patient exercises occasionally.  The patient does may get adequate calcium in her diet.  She had a recent cholesterol screen in 2017 that was borderline with mildly elevated triglycerides.    The patient denies current symptoms of depression, and will be weaning off the Zoloft.    Review  of Systems: Review of Systems  Constitutional: Positive for weight loss. Negative for chills and fever. Diaphoresis: 8# in the last year.  HENT: Negative for congestion, sinus pain and sore throat.   Eyes: Negative for blurred vision and pain.       Positive for eye irritation  Respiratory: Negative for hemoptysis, shortness of breath and wheezing.   Cardiovascular: Negative for chest pain, palpitations and leg swelling.  Gastrointestinal: Negative for abdominal pain, blood in stool, diarrhea, heartburn, nausea and vomiting.  Genitourinary: Negative for dysuria, frequency, hematuria and urgency.  Musculoskeletal: Negative for back pain, joint pain and myalgias.  Skin: Negative for itching and rash.  Neurological: Negative for dizziness, tingling and headaches.  Endo/Heme/Allergies: Negative for environmental allergies and polydipsia. Does not bruise/bleed easily.       Negative for hirsutism. Positive for hot flashes   Psychiatric/Behavioral: Negative for depression. The patient is not nervous/anxious and does not have insomnia.     Past Medical History:  Past Medical History:  Diagnosis Date  . Anemia   . Anxiety and depression   . Chicken pox   . Fibrocystic breast   . Heart murmur   . Hypertension   . Menorrhagia     Past Surgical History:  Past Surgical History:  Procedure Laterality Date  . ABLATION  2008   endometrial ablation  . BREAST BIOPSY Right  2005   Fibroadenoma, duct adenoma, right breast or o'clock.  Marland Kitchen BREAST CYST ASPIRATION  12/2015  . COLONOSCOPY  08/25/2011   removed 3 mm polyp Dr. Vira Agar  . DILATION AND CURETTAGE OF UTERUS    . HYSTEROSCOPY    . WISDOM TOOTH EXTRACTION      Family History:  Family History  Problem Relation Age of Onset  . Multiple sclerosis Mother   . Hypothyroidism Mother   . Diverticulosis Mother   . Hyperlipidemia Father   . Heart disease Father        Had MI 2016  . Hypertension Father   . Lung cancer Maternal Aunt         18s  . Alcohol abuse Maternal Uncle   . Lung cancer Maternal Uncle        60s  . Stroke Maternal Grandmother   . Hypertension Maternal Grandmother   . Kidney disease Maternal Grandmother   . Diabetes Maternal Grandmother   . Hypertension Paternal Grandfather   . Colon cancer Paternal Grandfather     Social History:  Social History   Social History  . Marital status: Divorced    Spouse name: N/A  . Number of children: 2  . 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 Smoking status: Never Smoker  . Smokeless tobacco: Never Used  . Alcohol use 0.0 oz/week     Comment: rare  . Drug use: No  . Sexual activity: Not Currently    Birth control/ protection: Other-see comments     Comment: ablation   Other Topics Concern  . Not on file   Social History Narrative  . No narrative on file    Allergies:  Allergies  Allergen Reactions  . Sulfa Antibiotics Rash    Medications: Prior to Admission medications   Medication Sig Start Date End Date Taking? Authorizing Provider  b complex vitamins tablet Take 1 tablet by mouth daily.    [provider]  BIOTIN PO Take by mouth.    [provider]  Multiple Minerals-Vitamins (CALCIUM & VIT D3 BONE HEALTH PO) Take by mouth.    [provider]  Multiple Vitamins-Minerals (OCUVITE EXTRA PO) Take by mouth.    [provider]  sertraline (ZOLOFT) 50 MG tablet Take 1 tablet (50 mg total) by mouth daily. 03/03/16   Coral Spikes, DO  vitamin B-12 (CYANOCOBALAMIN) 1000 MCG tablet Take 3,000 mcg by mouth daily.    [provider]  Wheat Dextrin (BENEFIBER PO) Take by mouth.    [provider]    Physical Exam Vitals: BP 100/60   Pulse (!) 47   Ht 5\' 2"  (1.575 m)   Wt 50.8 kg (112 lb)   BMI 20.49 kg/m   General: petite WF in NAD HEENT: normocephalic, anicteric Neck: no thyroid enlargement, no palpable nodules, no  cervical lymphadenopathy  Pulmonary: No increased work of breathing, CTAB Cardiovascular: RRR, without murmur  Breast: Breasts soft, no tenderness,  no skin or nipple retraction present, no nipple discharge. No masses in right breast. There is a 1-2 cm round mobile mass in the left breast at 4 0'clock with two tiny satellite masses above it.  No axillary, infraclavicular or supraclavicular lymphadenopathy. Abdomen: Soft, non-tender, non-distended.  Umbilicus without lesions.  No hepatomegaly or masses palpable. No evidence of hernia. Genitourinary:  External: Normal external female genitalia.  Normal urethral meatus, normal Bartholin's and Skene's glands.  Vagina: Normal vaginal mucosa, no evidence of prolapse.    Cervix: Grossly normal in appearance, no bleeding, non-tender  Uterus: Retroflexed, normal size with irregularity on the right fundal area (hx of fibroid on 2012 ultrasound), mobile, and non-tender  Adnexa: No adnexal masses, non-tender  Rectal: deferred  Lymphatic: no evidence of inguinal lymphadenopathy Extremities: no edema, erythema, or tenderness Neurologic: Grossly intact Psychiatric: mood appropriate, affect full  Physical Exam  Pulmonary/Chest:       Assessment: 51 y.o. G2P2 well woman exam Left breast masses and history of bilateral breast cysts   Plan:   1) Breast cancer screening - recommend monthly self breast exam and annual mammograms. Mammogram was ordered today.  2) Cervical cancer screening - Pap was done. ASCCP guidelines and rational discussed.  Patient opts for yearly screening interval  4) Contraception -advised if becomes sexually active, contraception needed. Will get FSH and LH. If elevated, would use contraception x 1 year  4) Routine healthcare maintenance including cholesterol and diabetes screening UTD   5) Colon cancer screening- due in 2023? Last colonoscopy 2013  6) RTO 1 year.  Dalia Heading, CNM

## 2016-11-04 LAB — FSH/LH
FSH: 4 m[IU]/mL
LH: 2 m[IU]/mL

## 2016-11-05 LAB — IGP,RFX APTIMA HPV ALL PTH: PAP Smear Comment: 0

## 2016-11-08 ENCOUNTER — Encounter: Payer: Self-pay | Admitting: Certified Nurse Midwife

## 2016-11-08 DIAGNOSIS — N6002 Solitary cyst of left breast: Secondary | ICD-10-CM

## 2016-11-08 DIAGNOSIS — N6001 Solitary cyst of right breast: Secondary | ICD-10-CM | POA: Insufficient documentation

## 2016-11-23 ENCOUNTER — Encounter: Payer: Self-pay | Admitting: Family Medicine

## 2016-11-23 ENCOUNTER — Other Ambulatory Visit: Payer: Self-pay | Admitting: Family Medicine

## 2016-11-23 ENCOUNTER — Ambulatory Visit (INDEPENDENT_AMBULATORY_CARE_PROVIDER_SITE_OTHER): Payer: BC Managed Care – PPO | Admitting: Family Medicine

## 2016-11-23 VITALS — BP 120/80 | HR 44 | Temp 98.5°F | Wt 112.4 lb

## 2016-11-23 DIAGNOSIS — H00012 Hordeolum externum right lower eyelid: Secondary | ICD-10-CM | POA: Diagnosis not present

## 2016-11-23 DIAGNOSIS — M799 Soft tissue disorder, unspecified: Secondary | ICD-10-CM

## 2016-11-23 DIAGNOSIS — R001 Bradycardia, unspecified: Secondary | ICD-10-CM

## 2016-11-23 LAB — COMPREHENSIVE METABOLIC PANEL
ALT: 8 U/L (ref 0–35)
AST: 13 U/L (ref 0–37)
Albumin: 4.3 g/dL (ref 3.5–5.2)
Alkaline Phosphatase: 40 U/L (ref 39–117)
BILIRUBIN TOTAL: 0.4 mg/dL (ref 0.2–1.2)
BUN: 14 mg/dL (ref 6–23)
CO2: 25 meq/L (ref 19–32)
CREATININE: 1.07 mg/dL (ref 0.40–1.20)
Calcium: 9.7 mg/dL (ref 8.4–10.5)
Chloride: 105 mEq/L (ref 96–112)
GFR: 57.82 mL/min — AB (ref >60.00)
GLUCOSE: 99 mg/dL (ref 70–99)
Potassium: 4.2 mEq/L (ref 3.5–5.1)
SODIUM: 138 meq/L (ref 135–145)
Total Protein: 7 g/dL (ref 6.0–8.3)

## 2016-11-23 LAB — TSH: TSH: 2.47 u[IU]/mL (ref 0.35–4.50)

## 2016-11-23 LAB — CBC
HCT: 40 % (ref 36.0–46.0)
Hemoglobin: 13.4 g/dL (ref 12.0–15.0)
MCHC: 33.4 g/dL (ref 30.0–36.0)
MCV: 90.4 fl (ref 78.0–100.0)
Platelets: 286 10*3/uL (ref 150.0–400.0)
RBC: 4.43 Mil/uL (ref 3.87–5.11)
RDW: 13 % (ref 11.5–15.5)
WBC: 8 10*3/uL (ref 4.0–10.5)

## 2016-11-23 NOTE — Assessment & Plan Note (Signed)
Suspect possible lymph node. Potentially could just be related to spasm as well. Patient is worried about this and thus we will obtain ultrasound of the area.

## 2016-11-23 NOTE — Assessment & Plan Note (Signed)
Rare lightheadedness. Asymptomatic currently. Sinus bradycardia on EKG. We will obtain lab work to evaluate. If unremarkable could consider cardiology evaluation.

## 2016-11-23 NOTE — Progress Notes (Signed)
  Tommi Rumps, MD Phone: [phone]  Carly Roberts is a 51 y.o. female who presents today for same-day visit.  Knot on neck: Patient notes yesterday she noticed a nodule in her left mid trapezius. There has not been painful. Has not changed in the last day. She did not note it prior to that. She's not noted any upper respiratory symptoms.  She does note she has a stye on her right lateral lower eyelid. There was some discomfort previously though no discomfort now. Has not drained anything. No eye pain. No vision changes. She has not tried warm compresses.  Bradycardia noted on vitals and exam. She notes rare lightheadedness. No chest pain or shortness of breath. She does note a history of some type of heart rate irregularity when she was 22 or so. Notes she had quite a workup though they did not ever do anything other than monitor.  PMH: nonsmoker.   ROS see history of present illness  Objective  Physical Exam Vitals:   11/23/16 0946  BP: 120/80  Pulse: (!) 44  Temp: 98.5 F (36.9 C)    BP Readings from Last 3 Encounters:  11/23/16 120/80  11/03/16 100/60  04/14/16 (!) 94/58   Wt Readings from Last 3 Encounters:  11/23/16 112 lb 6.4 oz (51 kg)  11/03/16 112 lb (50.8 kg)  04/14/16 108 lb 2 oz (49 kg)    Physical Exam  Constitutional: No distress.  Eyes:    Cardiovascular: Regular rhythm and normal heart sounds.  Bradycardia present.   Pulmonary/Chest: Effort normal and breath sounds normal.  Musculoskeletal: She exhibits no edema.       Back:  Lymphadenopathy:    She has no cervical adenopathy.  Skin: She is not diaphoretic.   EKG: Sinus bradycardia, rate 44, negative precordial T waves, no ischemic changes  Assessment/Plan: Please see individual problem list.  Soft tissue lesion Suspect possible lymph node. Potentially could just be related to spasm as well. Patient is worried about this and thus we will obtain ultrasound of the  area.  Bradycardia Rare lightheadedness. Asymptomatic currently. Sinus bradycardia on EKG. We will obtain lab work to evaluate. If unremarkable could consider cardiology evaluation.  Hordeolum externum of right lower eyelid Advise warm compresses. If not improving she should see her ophthalmologist.   Orders Placed This Encounter  Procedures  . [place] Misc Soft Tissue    Standing Status:   Future    Standing Expiration Date:   01/24/2018    Order Specific Question:   Reason for Exam (SYMPTOM  OR DIAGNOSIS REQUIRED)    Answer:   soft tissue nodule mid portion trapezius on left    Order Specific Question:   Preferred imaging location?    Answer:   [facility]  . TSH  . CBC  . Comp Met (CMET)  . EKG 12-Lead   Tommi Rumps, MD [place]

## 2016-11-23 NOTE — Assessment & Plan Note (Signed)
Advise warm compresses. If not improving she should see her ophthalmologist.

## 2016-11-23 NOTE — Patient Instructions (Signed)
Nice to see you. We will obtain an ultrasound to evaluate the nodule. We will get some lab work today as well. Please use warm compresses on your eyelid. If not improving please see your ophthalmologist.

## 2016-11-24 ENCOUNTER — Other Ambulatory Visit: Payer: Self-pay | Admitting: Family Medicine

## 2016-11-24 DIAGNOSIS — N179 Acute kidney failure, unspecified: Secondary | ICD-10-CM

## 2016-11-24 DIAGNOSIS — R001 Bradycardia, unspecified: Secondary | ICD-10-CM

## 2016-11-25 ENCOUNTER — Telehealth: Payer: Self-pay

## 2016-11-25 NOTE — Telephone Encounter (Signed)
Spoke with Dede Query, RN who advised it was ok to add patient onto Dr Donivan Scull schedule on 12/06/16 at 2:20pm.   Patient notified and is able to come on that time and date.  Message sent to scheduling.

## 2016-11-25 NOTE — Telephone Encounter (Signed)
No answer. Left message to call back to discuss earlier appt date and time.

## 2016-11-25 NOTE — Telephone Encounter (Signed)
Scheduled new patient referral from Dr. Caryl Bis for Bradycardia (routine) patient wants sooner that 9/24 with End.  Added to waitlist.

## 2016-11-26 ENCOUNTER — Ambulatory Visit
Admission: RE | Admit: 2016-11-26 | Discharge: 2016-11-26 | Disposition: A | Discharge status: Home / Self Care | Payer: BC Managed Care – PPO | Source: Ambulatory Visit | Attending: Family Medicine | Admitting: Family Medicine

## 2016-11-26 DIAGNOSIS — M799 Soft tissue disorder, unspecified: Secondary | ICD-10-CM | POA: Diagnosis present

## 2016-11-26 DIAGNOSIS — R599 Enlarged lymph nodes, unspecified: Secondary | ICD-10-CM | POA: Diagnosis not present

## 2016-11-26 NOTE — Progress Notes (Signed)
Cardiology Office Note  Date:  11/30/2016   ID:  [name], DOB Aug 11, 1965, MRN [id number]  PCP:  Coral Spikes, DO   Chief Complaint  Patient presents with  . other     Referred by Dr.Sonnenberg for bradycardia at [facility] hx of heart attack/bloackage. Pt states she is doing well other than concerns for her heart rate. Pt refused EKG; states she had one at PCP 7/10. Reviewed meds with pt verbally.    HPI:  [name] is a very pleasant 51 year old woman with no significant past cardiac history who presents by referral from Dr. Caryl Bis for consultation of her bradycardia and family history of coronary disease  She reports that she feels well, asymptomatic from her bradycardia Recent EKG reviewed with her in the office today showing sinus bradycardia rate 44 bpm, no significant ST or T-wave changes  Apart from rare lightheadedness, she reports she is not affected by her low heart rate  Previous office notes reviewed showing heart rate 47, 51, all of these occasions she was asymptomatic  Normal lab work including BMP, TSH, CBC  total cholesterol 209, LDL 134 Hemoglobin was normal TSH 1.42  Long discussion concerning her family history, father with coronary disease. He was a smoker  She does not smoke, no diabetes Active, exercises    PMH:   has a past medical history of Anemia; Anxiety and depression; Chicken pox; Fibrocystic breast; Heart murmur; Hypertension; and Menorrhagia.  PSH:    Past Surgical History:  Procedure Laterality Date  . ABLATION  2008   endometrial ablation  . BREAST BIOPSY Right 2005   Fibroadenoma, duct adenoma, right breast or o'clock.  Marland Kitchen BREAST CYST ASPIRATION  12/2015  . COLONOSCOPY  08/25/2011   removed 3 mm polyp Dr. Vira Agar  . DILATION AND CURETTAGE OF UTERUS    . HYSTEROSCOPY    . WISDOM TOOTH EXTRACTION      Current Outpatient Prescriptions  Medication Sig Dispense Refill  . b complex vitamins tablet Take 1 tablet by mouth daily.     Marland Kitchen BIOTIN PO Take by mouth.    Marland Kitchen ibuprofen (ADVIL,MOTRIN) 200 MG tablet Take 200 mg by mouth every 6 (six) hours as needed.    . Multiple Minerals-Vitamins (CALCIUM & VIT D3 BONE HEALTH PO) Take by mouth.    . sertraline (ZOLOFT) 50 MG tablet Take 1 tablet (50 mg total) by mouth daily. 90 tablet 1  . tobramycin-dexamethasone (TOBRADEX) ophthalmic solution Place 1 drop into both eyes 3 (three) times daily.    . Wheat Dextrin (BENEFIBER PO) Take by mouth.     No current facility-administered medications for this visit.      Allergies:   Sulfa antibiotics   Social History:  The patient  reports that she has never smoked. She has never used smokeless tobacco. She reports that she drinks alcohol. She reports that she does not use drugs.   Family History:   family history includes Alcohol abuse in her maternal uncle; Colon cancer in her paternal grandfather; Diabetes in her maternal grandmother; Diverticulosis in her mother; Heart disease in her father; Hyperlipidemia in her father; Hypertension in her father, maternal grandmother, and paternal grandfather; Hypothyroidism in her mother; Kidney disease in her maternal grandmother; Lung cancer in her maternal aunt and maternal uncle; Multiple sclerosis in her mother; Stroke in her maternal grandmother.    Review of Systems: Review of Systems  Constitutional: Negative.   Respiratory: Negative.   Cardiovascular: Negative.   Gastrointestinal: Negative.  Musculoskeletal: Negative.   Neurological: Negative.   Psychiatric/Behavioral: Negative.   All other systems reviewed and are negative.    PHYSICAL EXAM: VS:  BP 120/76 (BP Location: Right Arm, Patient Position: Sitting, Cuff Size: Normal)   Pulse (!) 52   Ht 5\' 2"  (1.575 m)   Wt 113 lb 12 oz (51.6 kg)   BMI 20.81 kg/m  , BMI Body mass index is 20.81 kg/m.  GEN: Well nourished, well developed, in no acute distress  HEENT: normal  Neck: no JVD, carotid bruits, or masses Cardiac: RRR;  no murmurs, rubs, or gallops,no edema  Respiratory:  clear to auscultation bilaterally, normal work of breathing GI: soft, nontender, nondistended, + BS MS: no deformity or atrophy  Skin: warm and dry, no rash Neuro:  Strength and sensation are intact Psych: euthymic mood, full affect    Recent Labs: 11/23/2016: ALT 8; BUN 14; Creatinine, Ser 1.07; Hemoglobin 13.4; Platelets 286.0; Potassium 4.2; Sodium 138; TSH 2.47    Lipid Panel Lab Results  Component Value Date   CHOL 209 (A) 02/14/2016   HDL 42 02/14/2016   LDLCALC 134 02/14/2016   TRIG 164 (A) 02/14/2016      Wt Readings from Last 3 Encounters:  11/30/16 113 lb 12 oz (51.6 kg)  11/23/16 112 lb 6.4 oz (51 kg)  11/03/16 112 lb (50.8 kg)       ASSESSMENT AND PLAN:  Bradycardia Asymptomatic bradycardia, dates back quite some time Given blood pressure stable, asymptomatic, no further workup needed  Family history of coronary artery disease As detailed below, workup could include CT coronary calcium scoring Also last or modifications Father was a smoker, patient is a nonsmoker, nondiabetic, lower risk  Elevated LDL cholesterol level Long discussion concerning cholesterol Recommended continued last modifications For risk stratification a CT coronary calcium score could be performed. This was discussed with her in detail. She will call us if she would like any further workup. This is certainly not urgent  Murmur Very low-grade murmur around the aortic valve, likely benign in nature Would periodically monitor every several years and if murmur gets much louder would order echocardiogram. Currently very low-grade and does not need workup  Disposition:   F/U  as needed    Total encounter time more than 60 minutes  Greater than 50% was spent in counseling and coordination of care with the patient  Patient was seen in consultation and will be referred back to Dr. Biagio Quint for ongoing care of the issues detailed  above   No orders of the defined types were placed in this encounter.    Signed, Esmond Plants, M.D., Ph.D. 11/30/2016  [place], [place]

## 2016-11-30 ENCOUNTER — Ambulatory Visit (INDEPENDENT_AMBULATORY_CARE_PROVIDER_SITE_OTHER): Payer: BC Managed Care – PPO | Admitting: Cardiovascular Disease

## 2016-11-30 ENCOUNTER — Encounter: Payer: Self-pay | Admitting: Cardiovascular Disease

## 2016-11-30 VITALS — BP 120/76 | HR 52 | Ht 62.0 in | Wt 113.8 lb

## 2016-11-30 DIAGNOSIS — Z8249 Family history of ischemic heart disease and other diseases of the circulatory system: Secondary | ICD-10-CM

## 2016-11-30 DIAGNOSIS — E7800 Pure hypercholesterolemia, unspecified: Secondary | ICD-10-CM | POA: Diagnosis not present

## 2016-11-30 DIAGNOSIS — R011 Cardiac murmur, unspecified: Secondary | ICD-10-CM | POA: Insufficient documentation

## 2016-11-30 DIAGNOSIS — R001 Bradycardia, unspecified: Secondary | ICD-10-CM | POA: Diagnosis not present

## 2016-11-30 NOTE — Patient Instructions (Addendum)
Medication Instructions:   No medication changes made  Labwork:  No new labs needed  Testing/Procedures:   Research CT coronary calcium score $150   Follow-Up: It was a pleasure seeing you in the office today. Please call us if you have new issues that need to be addressed before your next appt.  [phone]  Your physician wants you to follow-up in:   As needed   If you need a refill on your cardiac medications before your next appointment, please call your pharmacy.     Coronary Calcium Scan A coronary calcium scan is an imaging test used to look for deposits of calcium and other fatty materials (plaques) in the inner lining of the blood vessels of the heart (coronary arteries). These deposits of calcium and plaques can partly clog and narrow the coronary arteries without producing any symptoms or warning signs. This puts a person at risk for a heart attack. This test can detect these deposits before symptoms develop. Tell a health care provider about:  Any allergies you have.  All medicines you are taking, including vitamins, herbs, eye drops, creams, and over-the-counter medicines.  Any problems you or family members have had with anesthetic medicines.  Any blood disorders you have.  Any surgeries you have had.  Any medical conditions you have.  Whether you are pregnant or may be pregnant. What are the risks? Generally, this is a safe procedure. However, problems may occur, including:  Harm to a pregnant woman and her unborn baby. This test involves the use of radiation. Radiation exposure can be dangerous to a pregnant woman and her unborn baby. If you are pregnant, you generally should not have this procedure done.  Slight increase in the risk of cancer. This is because of the radiation involved in the test.  What happens before the procedure? No preparation is needed for this procedure. What happens during the procedure?  You will undress and remove  any jewelry around your neck or chest.  You will put on a hospital gown.  Sticky electrodes will be placed on your chest. The electrodes will be connected to an electrocardiogram (ECG) machine to record a tracing of the electrical activity of your heart.  A CT scanner will take pictures of your heart. During this time, you will be asked to lie still and hold your breath for 2-3 seconds while a picture of your heart is being taken. The procedure may vary among health care providers and hospitals. What happens after the procedure?  You can get dressed.  You can return to your normal activities.  It is up to you to get the results of your test. Ask your health care provider, or the department that is doing the test, when your results will be ready. Summary  A coronary calcium scan is an imaging test used to look for deposits of calcium and other fatty materials (plaques) in the inner lining of the blood vessels of the heart (coronary arteries).  Generally, this is a safe procedure. Tell your health care provider if you are pregnant or may be pregnant.  No preparation is needed for this procedure.  A CT scanner will take pictures of your heart.  You can return to your normal activities after the scan is done. This information is not intended to replace advice given to you by your health care provider. Make sure you discuss any questions you have with your health care provider. Document Released: 10/30/2007 Document Revised: 03/22/2016 Document Reviewed: 03/22/2016 Elsevier Interactive  Patient Education  2017 Elsevier Inc.  

## 2016-12-07 ENCOUNTER — Telehealth: Payer: Self-pay | Admitting: Family Medicine

## 2016-12-07 ENCOUNTER — Other Ambulatory Visit (INDEPENDENT_AMBULATORY_CARE_PROVIDER_SITE_OTHER): Payer: BC Managed Care – PPO

## 2016-12-07 DIAGNOSIS — N179 Acute kidney failure, unspecified: Secondary | ICD-10-CM

## 2016-12-07 DIAGNOSIS — R591 Generalized enlarged lymph nodes: Secondary | ICD-10-CM

## 2016-12-07 LAB — BASIC METABOLIC PANEL
BUN: 14 mg/dL (ref 6–23)
CHLORIDE: 103 meq/L (ref 96–112)
CO2: 29 meq/L (ref 19–32)
Calcium: 10 mg/dL (ref 8.4–10.5)
Creatinine, Ser: 1.03 mg/dL (ref 0.40–1.20)
GFR: 60.41 mL/min (ref >60.00)
Glucose, Bld: 95 mg/dL (ref 70–99)
POTASSIUM: 3.8 meq/L (ref 3.5–5.1)
Sodium: 138 mEq/L (ref 135–145)

## 2016-12-07 NOTE — Telephone Encounter (Signed)
After explaining results of [place] to patient , she ask if MD would make the referral to general surgeon, patient prefers Carly Roberts.

## 2016-12-07 NOTE — Telephone Encounter (Signed)
Referral placed.

## 2016-12-08 ENCOUNTER — Other Ambulatory Visit: Payer: Self-pay | Admitting: Internal Medicine

## 2016-12-08 ENCOUNTER — Ambulatory Visit
Admission: RE | Admit: 2016-12-08 | Discharge: 2016-12-08 | Disposition: A | Discharge status: Home / Self Care | Payer: BC Managed Care – PPO | Source: Ambulatory Visit | Attending: Internal Medicine | Admitting: Internal Medicine

## 2016-12-08 DIAGNOSIS — R590 Localized enlarged lymph nodes: Secondary | ICD-10-CM | POA: Insufficient documentation

## 2016-12-08 DIAGNOSIS — R918 Other nonspecific abnormal finding of lung field: Secondary | ICD-10-CM | POA: Diagnosis not present

## 2016-12-08 DIAGNOSIS — C8599 Non-Hodgkin lymphoma, unspecified, extranodal and solid organ sites: Secondary | ICD-10-CM

## 2016-12-08 MED ORDER — IOPAMIDOL (ISOVUE-300) INJECTION 61%
75.0000 mL | Freq: Once | INTRAVENOUS | Status: AC | PRN
  Administered 2016-12-08: 75 mL via INTRAVENOUS

## 2016-12-08 NOTE — Telephone Encounter (Signed)
Left message for patient to return call to office just want to notify patient referral requested by patient has been ordered.

## 2016-12-10 ENCOUNTER — Ambulatory Visit
Admission: RE | Admit: 2016-12-10 | Discharge: 2016-12-10 | Disposition: A | Discharge status: Home / Self Care | Payer: BC Managed Care – PPO | Source: Ambulatory Visit | Attending: Internal Medicine | Admitting: Internal Medicine

## 2016-12-10 ENCOUNTER — Ambulatory Visit: Payer: Self-pay | Admitting: Cardiothoracic Surgery

## 2016-12-10 DIAGNOSIS — R590 Localized enlarged lymph nodes: Secondary | ICD-10-CM | POA: Diagnosis present

## 2016-12-10 DIAGNOSIS — C8599 Non-Hodgkin lymphoma, unspecified, extranodal and solid organ sites: Secondary | ICD-10-CM | POA: Diagnosis present

## 2016-12-10 LAB — GLUCOSE, CAPILLARY: GLUCOSE-CAPILLARY: 94 mg/dL (ref 65–99)

## 2016-12-10 MED ORDER — FLUDEOXYGLUCOSE F - 18 (FDG) INJECTION
13.1300 | Freq: Once | INTRAVENOUS | Status: AC | PRN
  Administered 2016-12-10: 13.13 via INTRAVENOUS

## 2016-12-17 ENCOUNTER — Encounter: Payer: Self-pay | Admitting: Cardiothoracic Surgery

## 2016-12-17 ENCOUNTER — Ambulatory Visit (INDEPENDENT_AMBULATORY_CARE_PROVIDER_SITE_OTHER): Payer: BC Managed Care – PPO | Admitting: Cardiothoracic Surgery

## 2016-12-17 ENCOUNTER — Encounter: Payer: Self-pay | Admitting: General Surgery

## 2016-12-17 ENCOUNTER — Telehealth: Payer: Self-pay

## 2016-12-17 VITALS — BP 113/67 | HR 99 | Temp 97.9°F | Ht 64.0 in | Wt 113.0 lb

## 2016-12-17 DIAGNOSIS — M799 Soft tissue disorder, unspecified: Secondary | ICD-10-CM | POA: Diagnosis not present

## 2016-12-17 DIAGNOSIS — R591 Generalized enlarged lymph nodes: Secondary | ICD-10-CM

## 2016-12-17 NOTE — Telephone Encounter (Signed)
error 

## 2016-12-17 NOTE — Patient Instructions (Signed)
I will be calling you with your U/S biopsy as soon as they call me.

## 2016-12-20 ENCOUNTER — Ambulatory Visit
Admission: RE | Admit: 2016-12-20 | Discharge: 2016-12-20 | Disposition: A | Discharge status: Home / Self Care | Payer: BC Managed Care – PPO | Source: Ambulatory Visit | Attending: Cardiothoracic Surgery | Admitting: Cardiothoracic Surgery

## 2016-12-20 DIAGNOSIS — Z841 Family history of disorders of kidney and ureter: Secondary | ICD-10-CM | POA: Diagnosis not present

## 2016-12-20 DIAGNOSIS — M799 Soft tissue disorder, unspecified: Secondary | ICD-10-CM | POA: Insufficient documentation

## 2016-12-20 DIAGNOSIS — Z800 Family history of malignant neoplasm of digestive organs: Secondary | ICD-10-CM | POA: Insufficient documentation

## 2016-12-20 DIAGNOSIS — Z9889 Other specified postprocedural states: Secondary | ICD-10-CM | POA: Insufficient documentation

## 2016-12-20 DIAGNOSIS — I10 Essential (primary) hypertension: Secondary | ICD-10-CM | POA: Diagnosis not present

## 2016-12-20 DIAGNOSIS — Z888 Allergy status to other drugs, medicaments and biological substances status: Secondary | ICD-10-CM | POA: Insufficient documentation

## 2016-12-20 DIAGNOSIS — Z882 Allergy status to sulfonamides status: Secondary | ICD-10-CM | POA: Insufficient documentation

## 2016-12-20 DIAGNOSIS — F329 Major depressive disorder, single episode, unspecified: Secondary | ICD-10-CM | POA: Diagnosis not present

## 2016-12-20 DIAGNOSIS — Z79899 Other long term (current) drug therapy: Secondary | ICD-10-CM | POA: Diagnosis not present

## 2016-12-20 DIAGNOSIS — Z811 Family history of alcohol abuse and dependence: Secondary | ICD-10-CM | POA: Diagnosis not present

## 2016-12-20 DIAGNOSIS — Z801 Family history of malignant neoplasm of trachea, bronchus and lung: Secondary | ICD-10-CM | POA: Insufficient documentation

## 2016-12-20 DIAGNOSIS — Z823 Family history of stroke: Secondary | ICD-10-CM | POA: Insufficient documentation

## 2016-12-20 DIAGNOSIS — F419 Anxiety disorder, unspecified: Secondary | ICD-10-CM | POA: Diagnosis not present

## 2016-12-20 DIAGNOSIS — Z833 Family history of diabetes mellitus: Secondary | ICD-10-CM | POA: Insufficient documentation

## 2016-12-20 DIAGNOSIS — C8200 Follicular lymphoma grade I, unspecified site: Secondary | ICD-10-CM | POA: Diagnosis not present

## 2016-12-20 DIAGNOSIS — Z8249 Family history of ischemic heart disease and other diseases of the circulatory system: Secondary | ICD-10-CM | POA: Diagnosis not present

## 2016-12-20 DIAGNOSIS — R221 Localized swelling, mass and lump, neck: Secondary | ICD-10-CM | POA: Insufficient documentation

## 2016-12-20 LAB — CBC
HEMATOCRIT: 38.3 % (ref 35.0–47.0)
HEMOGLOBIN: 13.1 g/dL (ref 12.0–16.0)
MCH: 30.4 pg (ref 26.0–34.0)
MCHC: 34.2 g/dL (ref 32.0–36.0)
MCV: 88.9 fL (ref 80.0–100.0)
Platelets: 243 10*3/uL (ref 150–440)
RBC: 4.3 MIL/uL (ref 3.80–5.20)
RDW: 13 % (ref 11.5–14.5)
WBC: 7.2 10*3/uL (ref 3.6–11.0)

## 2016-12-20 LAB — PROTIME-INR
INR: 0.91
Prothrombin Time: 12.2 seconds (ref 11.4–15.2)

## 2016-12-20 LAB — APTT: APTT: 27 s (ref 24–36)

## 2016-12-20 MED ORDER — SODIUM CHLORIDE 0.9 % IV SOLN
Freq: Once | INTRAVENOUS | Status: AC
  Administered 2016-12-20: 1000 mL via INTRAVENOUS

## 2016-12-20 MED ORDER — HYDROCODONE-ACETAMINOPHEN 5-325 MG PO TABS
1.0000 | ORAL_TABLET | Freq: Once | ORAL | Status: AC
  Administered 2016-12-20: 1 via ORAL

## 2016-12-20 MED ORDER — FENTANYL CITRATE (PF) 100 MCG/2ML IJ SOLN
INTRAMUSCULAR | Status: AC
  Filled 2016-12-20: qty 2

## 2016-12-20 MED ORDER — MIDAZOLAM HCL 5 MG/5ML IJ SOLN
INTRAMUSCULAR | Status: AC
  Filled 2016-12-20: qty 5

## 2016-12-20 MED ORDER — MIDAZOLAM HCL 5 MG/5ML IJ SOLN
INTRAMUSCULAR | Status: AC | PRN
  Administered 2016-12-20: 1 mg via INTRAVENOUS

## 2016-12-20 MED ORDER — HYDROCODONE-ACETAMINOPHEN 5-325 MG PO TABS
ORAL_TABLET | ORAL | Status: AC
  Administered 2016-12-20: 1 via ORAL
  Filled 2016-12-20: qty 1

## 2016-12-20 MED ORDER — SODIUM CHLORIDE 0.9 % IV BOLUS (SEPSIS)
250.0000 mL | Freq: Once | INTRAVENOUS | Status: AC
  Administered 2016-12-20: 250 mL via INTRAVENOUS

## 2016-12-20 MED ORDER — FENTANYL CITRATE (PF) 100 MCG/2ML IJ SOLN
INTRAMUSCULAR | Status: AC | PRN
  Administered 2016-12-20: 50 ug via INTRAVENOUS

## 2016-12-20 NOTE — H&P (Signed)
Chief Complaint: Patient was seen in consultation today for No chief complaint on file.  at the request of Oaks,Timothy  Referring Physician(s): Oaks,Timothy  Supervising Physician: Marybelle Killings  Patient Status: [facility] - Out-pt  History of Present Illness: Carly Roberts is a 51 y.o. female who presented with a neck lump. Workup revealed hypermetabolic widespread adenopathy. Biopsy of right groin lymph node is requested.  Past Medical History:  Diagnosis Date  . Anemia   . Anxiety and depression   . Chicken pox   . Fibrocystic breast   . Heart murmur   . Hypertension   . Menorrhagia     Past Surgical History:  Procedure Laterality Date  . ABLATION  2008   endometrial ablation  . BREAST BIOPSY Right 2005   Fibroadenoma, duct adenoma, right breast or o'clock.  Marland Kitchen BREAST CYST ASPIRATION  12/2015  . COLONOSCOPY  08/25/2011   removed 3 mm polyp Dr. Vira Agar  . DILATION AND CURETTAGE OF UTERUS    . HYSTEROSCOPY    . WISDOM TOOTH EXTRACTION      Allergies: Sulfa antibiotics and Sulfasalazine  Medications: Prior to Admission medications   Medication Sig Start Date End Date Taking? Authorizing Provider  b complex vitamins tablet Take 1 tablet by mouth daily.   Yes [provider]  BIOTIN PO Take by mouth.   Yes [provider]  ibuprofen (ADVIL,MOTRIN) 200 MG tablet Take 200 mg by mouth every 6 (six) hours as needed.   Yes [provider]  Multiple Minerals-Vitamins (CALCIUM & VIT D3 BONE HEALTH PO) Take by mouth.   Yes [provider]  Wheat Dextrin (BENEFIBER PO) Take by mouth.   Yes [provider]  sertraline (ZOLOFT) 50 MG tablet Take 1 tablet (50 mg total) by mouth daily. Patient not taking: Reported on 12/20/2016 03/03/16   Coral Spikes, DO     Family History  Problem Relation Age of Onset  . Multiple sclerosis Mother   . Hypothyroidism Mother   . Diverticulosis Mother   . Hyperlipidemia Father   . Heart disease  Father        Had MI 2016  . Hypertension Father   . Lung cancer Maternal Aunt        32s  . Alcohol abuse Maternal Uncle   . Lung cancer Maternal Uncle        36s  . Stroke Maternal Grandmother   . Hypertension Maternal Grandmother   . Kidney disease Maternal Grandmother   . Diabetes Maternal Grandmother   . Hypertension Paternal Grandfather   . Colon cancer Paternal Grandfather     Social History   Social History  . Marital status: Divorced    Spouse name: N/A  . Number of children: 2  . 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 Smoking status: Never Smoker  . Smokeless tobacco: Never Used  . Alcohol use 0.0 oz/week     Comment: rare  . Drug use: No  . Sexual activity: Not Currently    Birth control/ protection: Other-see comments     Comment: ablation   Other Topics Concern  . None   Social History Narrative  . None     Review of Systems: A 12 point ROS discussed and pertinent positives are indicated in the HPI above.  All other systems are negative.  Review of Systems  Vital Signs: BP (!) 110/45  Pulse (!) 44   Temp 98.3 F (36.8 C) (Oral)   Resp 10   Ht 5\' 2"  (1.575 m)   Wt 113 lb (51.3 kg)   SpO2 98%   BMI 20.67 kg/m   Physical Exam  Constitutional: She is oriented to person, place, and time. She appears well-developed and well-nourished.  HENT:  Head: Normocephalic and atraumatic.  Cardiovascular: Normal rate and regular rhythm.   Pulmonary/Chest: Effort normal and breath sounds normal.  Neurological: She is alert and oriented to person, place, and time.    Mallampati Score:   1  Imaging: Ct Soft Tissue Neck W Contrast  Result Date: 12/08/2016 CLINICAL DATA:  Left cervical lymph node swelling. No history of cancer. EXAM: CT NECK AND CHEST WITH CONTRAST TECHNIQUE: Multidetector CT imaging of the neck and chest was performed using the standard protocol after bolus  administration of intravenous contrast. CONTRAST:  44mL ISOVUE-300 IOPAMIDOL (ISOVUE-300) INJECTION 61% COMPARISON:  Ultrasound 11/26/2016. FINDINGS: CT NECK FINDINGS Pharynx and larynx: Normal. No mass or swelling. Salivary glands: No inflammation, mass, or stone. Thyroid: Normal. Lymph nodes: Left supraclavicular lymph nodes are noted, several of which are mildly enlarged. 10 mm short axis diameter lymph node on image 85. 14 mm short axis diameter lymph node on image 89. Other small non pathologically enlarged cervical lymph nodes bilaterally. Posteriorly, there is a 9 mm short axis diameter lymph node on image 63 on the left. Vascular: Negative. Limited intracranial: Negative Visualized orbits: Negative Mastoids and visualized paranasal sinuses: Clear Skeleton: No acute or aggressive process. Other: None CT CHEST FINDINGS Cardiovascular: Heart is normal size. Aorta is normal caliber. Mediastinum/Nodes: Mildly prominent bilateral axillary lymph and subpectoral nodes. Index right axillary lymph node has a short axis diameter of 11 mm on image 26. Left axillary/subpectoral lymph node on image 31 has a short axis diameter of 15 mm. Other similarly sized or smaller bilateral axillary lymph nodes. No mediastinal or hilar adenopathy. Trachea and esophagus are unremarkable. Lungs/Pleura: Small nodule peripherally in the left lower lobe, 3 mm. 3 mm subpleural nodule in the right lower lobe on image 98. Lungs otherwise clear. No effusions. Upper Abdomen: Imaging into the upper abdomen shows no acute findings. Musculoskeletal: Chest wall soft tissues are unremarkable. No acute bony abnormality. IMPRESSION: Mild adenopathy in the left supraclavicular region and bilateral axillary/ subpectoral regions. Findings concerning for possible lymphoproliferative disorder. Small bilateral 3 mm lower lobe pulmonary nodules, nonspecific. Recommend attention on follow-up imaging. Electronically Signed   By: Rolm Baptise M.D.   On:  12/08/2016 11:01   Ct Chest W Contrast  Result Date: 12/08/2016 CLINICAL DATA:  Left cervical lymph node swelling. No history of cancer. EXAM: CT NECK AND CHEST WITH CONTRAST TECHNIQUE: Multidetector CT imaging of the neck and chest was performed using the standard protocol after bolus administration of intravenous contrast. CONTRAST:  67mL ISOVUE-300 IOPAMIDOL (ISOVUE-300) INJECTION 61% COMPARISON:  Ultrasound 11/26/2016. FINDINGS: CT NECK FINDINGS Pharynx and larynx: Normal. No mass or swelling. Salivary glands: No inflammation, mass, or stone. Thyroid: Normal. Lymph nodes: Left supraclavicular lymph nodes are noted, several of which are mildly enlarged. 10 mm short axis diameter lymph node on image 85. 14 mm short axis diameter lymph node on image 89. Other small non pathologically enlarged cervical lymph nodes bilaterally. Posteriorly, there is a 9 mm short axis diameter lymph node on image 63 on the left. Vascular: Negative. Limited intracranial: Negative Visualized orbits: Negative Mastoids and visualized paranasal sinuses: Clear Skeleton: No acute or aggressive process.  Other: None CT CHEST FINDINGS Cardiovascular: Heart is normal size. Aorta is normal caliber. Mediastinum/Nodes: Mildly prominent bilateral axillary lymph and subpectoral nodes. Index right axillary lymph node has a short axis diameter of 11 mm on image 26. Left axillary/subpectoral lymph node on image 31 has a short axis diameter of 15 mm. Other similarly sized or smaller bilateral axillary lymph nodes. No mediastinal or hilar adenopathy. Trachea and esophagus are unremarkable. Lungs/Pleura: Small nodule peripherally in the left lower lobe, 3 mm. 3 mm subpleural nodule in the right lower lobe on image 98. Lungs otherwise clear. No effusions. Upper Abdomen: Imaging into the upper abdomen shows no acute findings. Musculoskeletal: Chest wall soft tissues are unremarkable. No acute bony abnormality. IMPRESSION: Mild adenopathy in the left  supraclavicular region and bilateral axillary/ subpectoral regions. Findings concerning for possible lymphoproliferative disorder. Small bilateral 3 mm lower lobe pulmonary nodules, nonspecific. Recommend attention on follow-up imaging. Electronically Signed   By: Rolm Baptise M.D.   On: 12/08/2016 11:01   US Soft Tissue Head/neck  Result Date: 11/26/2016 CLINICAL DATA:  Soft tissue nodule involving the mid aspect of the left trapezius. EXAM: ULTRASOUND OF HEAD/NECK SOFT TISSUES TECHNIQUE: Ultrasound examination of the head and neck soft tissues was performed in the area of clinical concern. COMPARISON:  None. FINDINGS: The patient's palpable area of concern appears to correlate with several prominent though non pathologically enlarged subcutaneous lymph nodes with dominant subcutaneous lymph node measuring 0.5 cm in greatest short axis diameter and maintaining a benign fatty hilum (image 18. Note, a similar benign-appearing subcutaneous lymph node is seen within the contralateral right side of the neck (representative image 25). IMPRESSION: Patient's palpable area of concern appears to correlate with benign appearing non pathologically enlarged subcutaneous lymph nodes. Electronically Signed   By: Sandi Mariscal M.D.   On: 11/26/2016 11:54   Nm Pet Image Initial (pi) Skull Base To Thigh  Result Date: 12/10/2016 CLINICAL DATA:  Initial treatment strategy for pulmonary lymphoma. EXAM: NUCLEAR MEDICINE PET SKULL BASE TO THIGH TECHNIQUE: 13.1 mCi F-18 FDG was injected intravenously. Full-ring PET imaging was performed from the skull base to thigh after the radiotracer. CT data was obtained and used for attenuation correction and anatomic localization. FASTING BLOOD GLUCOSE:  Value: 94 mg/dl COMPARISON:  CT scan dated 12/08/2016 FINDINGS: NECK Multiple mostly small level IIa, IIb, III, IV, and V lymph nodes are present bilaterally. An index left level V lymph node measuring 0.7 cm in short axis on image 32/3 has a  maximum SUV of 1.9 (Deauville 2). CHEST Bilateral mildly hypermetabolic axillary and subpectoral adenopathy noted along with mild left supraclavicular adenopathy. Dominant left supraclavicular node measures 1.1 cm in short axis on image 52/3 with maximum standard uptake value 3.2. Index left subpectoral lymph node measuring 1.2 cm in short axis on image 64/ 3 has a maximum standard uptake value of 3.7 (Deauville 4). 3 mm left lower lobe subpleural pulmonary nodule on image 100/3 persists, no visible hypermetabolic activity although this is well below sensitive PET-CT size thresholds. Background mediastinal blood pool activity 2.5. ABDOMEN/PELVIS Pathologic periaortic, common iliac, external iliac, and inguinal adenopathy observed. Left common iliac node measuring 1.8 cm in short axis has maximum standard uptake value 4.6 (Deauville 4). An index right external iliac node measuring 1.5 cm in short axis on image 201/3 has a maximum SUV of 4.1. Background hepatic activity SUV 2.6. No abnormal splenic activity or splenomegaly. Considerably enlarged uterus with multilobular appearance but without overt hypermetabolic activity, favoring a  fibroid uterus. Dependent density in the gallbladder, potentially sludge or small gallstones. Somewhat diminutive and irregular left kidney, possibly from scarring. SKELETON No focal hypermetabolic activity to suggest skeletal metastasis. IMPRESSION: 1. Pathologically enlarged Deauville 4 left supraclavicular and left subpectoral and axillary adenopathy in the chest. Pathologically enlarged total for retroperitoneal and pelvic adenopathy in the abdomen. 2. Upper normal sized Deauville 2 scattered lymph nodes in the neck. 3. Other imaging findings of potential clinical significance: Fibroid uterus. Suspected scarring of the left kidney. Electronically Signed   By: Van Clines M.D.   On: 12/10/2016 10:20    Labs:  CBC:  Recent Labs  11/23/16 1006 12/20/16 1020  WBC 8.0 7.2   HGB 13.4 13.1  HCT 40.0 38.3  PLT 286.0 243    COAGS:  Recent Labs  12/20/16 1020  INR 0.91  APTT 27    BMP:  Recent Labs  11/23/16 1006 12/07/16 1021  NA 138 138  K 4.2 3.8  CL 105 103  CO2 25 29  GLUCOSE 99 95  BUN 14 14  CALCIUM 9.7 10.0  CREATININE 1.07 1.03    LIVER FUNCTION TESTS:  Recent Labs  11/23/16 1006  BILITOT 0.4  AST 13  ALT 8  ALKPHOS 40  PROT 7.0  ALBUMIN 4.3    TUMOR MARKERS: No results for input(s): AFPTM, CEA, CA199, CHROMGRNA in the last 8760 hours.  Assessment and Plan:  Adenopathy. Right groin lymph node biopsy to follow.   Electronically Signed: Stella Encarnacion, ART A, MD 12/20/2016, 10:51 AM   I spent a total of  40 Minutes   in face to face in clinical consultation, greater than 50% of which was counseling/coordinating care for lymph node biopsy.

## 2016-12-20 NOTE — Procedures (Signed)
R inguinal LN Bx 18 g times four EBL 0 Comp 0

## 2016-12-22 ENCOUNTER — Telehealth: Payer: Self-pay | Admitting: Cardiothoracic Surgery

## 2016-12-22 NOTE — Telephone Encounter (Signed)
Pt would like to check on biopsy results - Please advise

## 2016-12-22 NOTE — Progress Notes (Signed)
Patient ID: Carly Roberts, female   DOB: 03/18/1966, 51 y.o.   MRN: [id number]  Chief Complaint  Patient presents with  . New Patient (Initial Visit)    Needs a lung biopsy     Referred By Dr. Emily Filbert Reason for Referral Lymphadenopathy  HPI Location, Quality, Duration, Severity, Timing, Context, Modifying Factors, Associated Signs and Symptoms.  Carly Roberts is a 51 y.o. female.  She was in her usual state of health until a few days ago when she started to notice an enlarging left neck mass.  She went to her primary care physician and an ultrasound was done showing some small lymph nodes. She continued to feel uncomfortable in her left neck and stated that she felt some "pins and needles" and sought attention with Dr. Sabra Heck. A CT scan was performed showing extensive mediastinal and supraclavicular and retroperitoneal lymphadenopathy. There are some small scattered bilateral pulmonary nodules and the patient was sent to me for ongoing evaluation. Her only other symptom is that she's had some hair loss which she states has been going on for a couple months. She's had no fevers or chills and no weight loss. Otherwise she is in excellent health and has no complaints. Her appetite has been good and she has had a steady weight.   Past Medical History:  Diagnosis Date  . Anemia   . Anxiety and depression   . Chicken pox   . Fibrocystic breast   . Heart murmur   . Hypertension   . Menorrhagia     Past Surgical History:  Procedure Laterality Date  . ABLATION  2008   endometrial ablation  . BREAST BIOPSY Right 2005   Fibroadenoma, duct adenoma, right breast or o'clock.  Marland Kitchen BREAST CYST ASPIRATION  12/2015  . COLONOSCOPY  08/25/2011   removed 3 mm polyp Dr. Vira Agar  . DILATION AND CURETTAGE OF UTERUS    . HYSTEROSCOPY    . WISDOM TOOTH EXTRACTION      Family History  Problem Relation Age of Onset  . Multiple sclerosis Mother   . Hypothyroidism Mother   . Diverticulosis  Mother   . Hyperlipidemia Father   . Heart disease Father        Had MI 2016  . Hypertension Father   . Lung cancer Maternal Aunt        59s  . Alcohol abuse Maternal Uncle   . Lung cancer Maternal Uncle        58s  . Stroke Maternal Grandmother   . Hypertension Maternal Grandmother   . Kidney disease Maternal Grandmother   . Diabetes Maternal Grandmother   . Hypertension Paternal Grandfather   . Colon cancer Paternal Grandfather     Social History Social History  Substance Use Topics  . Smoking status: Never Smoker  . Smokeless tobacco: Never Used  . Alcohol use 0.0 oz/week     Comment: rare    Allergies  Allergen Reactions  . Sulfa Antibiotics Rash  . Sulfasalazine Rash    Current Outpatient Prescriptions  Medication Sig Dispense Refill  . b complex vitamins tablet Take 1 tablet by mouth daily.    Marland Kitchen BIOTIN PO Take by mouth.    Marland Kitchen ibuprofen (ADVIL,MOTRIN) 200 MG tablet Take 200 mg by mouth every 6 (six) hours as needed.    . Multiple Minerals-Vitamins (CALCIUM & VIT D3 BONE HEALTH PO) Take by mouth.    . sertraline (ZOLOFT) 50 MG tablet Take 1 tablet (50 mg total) by  mouth daily. (Patient not taking: Reported on 12/20/2016) 90 tablet 1  . Wheat Dextrin (BENEFIBER PO) Take by mouth.     No current facility-administered medications for this visit.       Review of Systems A complete review of systems was asked and was negative except for the following positive findings Hair loss, lethargy, swelling in her neck  Blood pressure 113/67, pulse 99, temperature 97.9 F (36.6 C), temperature source Oral, height 5\' 4"  (1.626 m), weight 113 lb (51.3 kg).  Physical Exam CONSTITUTIONAL:  Pleasant, well-developed, well-nourished, and in no acute distress. EYES: Pupils equal and reactive to light, Sclera non-icteric EARS, NOSE, MOUTH AND THROAT:  The oropharynx was clear.  Dentition is good repair.  Oral mucosa pink and moist. LYMPH NODES:  Lymph nodes In the neck  supraclavicular and groin were markedly prominent with the largest being in the right groin. I could not palpate any lymph nodes in her axilla. RESPIRATORY:  Lungs were clear.  Normal respiratory effort without pathologic use of accessory muscles of respiration CARDIOVASCULAR: Heart was regular without murmurs.  There were no carotid bruits. GI: The abdomen was soft, nontender, and nondistended. There were no palpable masses. There was no hepatosplenomegaly. There were normal bowel sounds in all quadrants. GU:  Rectal deferred.   MUSCULOSKELETAL:  Normal muscle strength and tone.  No clubbing or cyanosis.   SKIN:  There were no pathologic skin lesions.  There were no nodules on palpation. NEUROLOGIC:  Sensation is normal.  Cranial nerves are grossly intact. PSYCH:  Oriented to person, place and time.  Mood and affect are normal.  Data Reviewed CT scan  I have personally reviewed the patient's imaging, laboratory findings and medical records.    Assessment    I had a long discussion with her regarding the options. I believe that this may represent either inflammatory or malignant process and I recommended that she undergo a ultrasound-guided biopsy of the right groin go ahead and set her up for that.    Plan    We will set her up for an ultrasound-guided biopsy of the right groin. She will call me once this is done selectively review the results with her by phone. Alternatively I'm happy to see her to discuss these results.  Nestor Lewandowsky, MD 12/22/2016, 9:42 AM

## 2016-12-23 NOTE — Telephone Encounter (Signed)
Called patient back to let her know that she will need to see an Oncologist. Therefore, her appointment would be for next week on Wednesday 12/29/2016 with Dr. Tasia Catchings. I told patient that once I know about her pathology, that I would ask Dr. Genevive Bi to give her a call. Patient understood and had no further questions.

## 2016-12-23 NOTE — Telephone Encounter (Signed)
Called patient back to let her know that her pathology results are not back yet. However, once I receive the results, I would give her a call. Patient understood and had no further questions.

## 2016-12-24 LAB — SURGICAL PATHOLOGY

## 2016-12-24 NOTE — Telephone Encounter (Signed)
Dr. Genevive Bi called the patient to give her pathology report.

## 2016-12-27 ENCOUNTER — Encounter: Payer: Self-pay | Admitting: Oncology

## 2016-12-27 ENCOUNTER — Inpatient Hospital Stay: Payer: BC Managed Care – PPO

## 2016-12-27 ENCOUNTER — Ambulatory Visit
Admission: RE | Admit: 2016-12-27 | Discharge: 2016-12-27 | Disposition: A | Discharge status: Home / Self Care | Payer: BC Managed Care – PPO | Source: Ambulatory Visit | Attending: Certified Nurse Midwife | Admitting: Certified Nurse Midwife

## 2016-12-27 ENCOUNTER — Inpatient Hospital Stay: Payer: BC Managed Care – PPO | Attending: Oncology | Admitting: Oncology

## 2016-12-27 VITALS — BP 115/69 | HR 54 | Temp 98.3°F | Resp 16 | Ht 63.0 in | Wt 114.4 lb

## 2016-12-27 DIAGNOSIS — N6310 Unspecified lump in the right breast, unspecified quadrant: Secondary | ICD-10-CM | POA: Diagnosis not present

## 2016-12-27 DIAGNOSIS — R928 Other abnormal and inconclusive findings on diagnostic imaging of breast: Secondary | ICD-10-CM | POA: Diagnosis not present

## 2016-12-27 DIAGNOSIS — Z79899 Other long term (current) drug therapy: Secondary | ICD-10-CM | POA: Insufficient documentation

## 2016-12-27 DIAGNOSIS — C8290 Follicular lymphoma, unspecified, unspecified site: Secondary | ICD-10-CM

## 2016-12-27 DIAGNOSIS — C8208 Follicular lymphoma grade I, lymph nodes of multiple sites: Secondary | ICD-10-CM | POA: Diagnosis not present

## 2016-12-27 DIAGNOSIS — Z801 Family history of malignant neoplasm of trachea, bronchus and lung: Secondary | ICD-10-CM | POA: Diagnosis not present

## 2016-12-27 DIAGNOSIS — Z1231 Encounter for screening mammogram for malignant neoplasm of breast: Secondary | ICD-10-CM | POA: Diagnosis present

## 2016-12-27 DIAGNOSIS — F418 Other specified anxiety disorders: Secondary | ICD-10-CM | POA: Insufficient documentation

## 2016-12-27 DIAGNOSIS — I10 Essential (primary) hypertension: Secondary | ICD-10-CM | POA: Diagnosis not present

## 2016-12-27 DIAGNOSIS — Z800 Family history of malignant neoplasm of digestive organs: Secondary | ICD-10-CM

## 2016-12-27 DIAGNOSIS — Z1239 Encounter for other screening for malignant neoplasm of breast: Secondary | ICD-10-CM

## 2016-12-27 DIAGNOSIS — N6320 Unspecified lump in the left breast, unspecified quadrant: Secondary | ICD-10-CM | POA: Diagnosis not present

## 2016-12-27 DIAGNOSIS — C8298 Follicular lymphoma, unspecified, lymph nodes of multiple sites: Secondary | ICD-10-CM | POA: Insufficient documentation

## 2016-12-27 HISTORY — DX: Malignant (primary) neoplasm, unspecified: C80.1

## 2016-12-27 LAB — LACTATE DEHYDROGENASE: LDH: 136 U/L (ref 98–192)

## 2016-12-27 NOTE — Progress Notes (Signed)
[place] Initial Consultation  Patient Care Team: Rusty Aus, MD as PCP - General (Internal Medicine) Dalia Heading, CNM as Midwife (Certified Nurse Midwife) Bary Castilla, Forest Gleason, MD (General Surgery) Minna Merritts, MD as Consulting Physician (Cardiology)  CHIEF COMPLAINTS/PURPOSE OF CONSULTATION: I have lymphoma   No history exists.    HISTORY OF PRESENTING ILLNESS: Carly Roberts 51 y.o. female who is referred by Dr.Oaks to [place] for evaluation and management of recently diagnosed follicular lymphoma. patientWas in her usual state of health until recently when she started to notice enlarging left neck mass she went to her primary care physician and ultrasound was done showing a few small lymph nodes. She continues to feel uncomfortableness in her neck and therefore a CT scan was performed. CT neck and chest with contrast on 12/08/2016 showed mild adenopathy in the left supraclavicular region and bilateral axillary/subpectoral small bilateral lower lobe nodules nonspecific. As followed by a PET scan on 12/10/2016 which showed pathologically enlarged Deauville 2 left supraclavicular and left subpectoral and axillary adenopathy in the chest. Pathologically enlarged nodes or retroperitoneal pelvic adenopathy in the abdomen. Normal size Deauville 2 scattered lymph nodes in the neck. Patient had ultrasound guided right inguinal node biopsy on 12/20/2016 and pathology showed follicular lymphoma, grade 1-2. KI 67 showed a low proliferation index 5%. Patient was referred to see me for discussion about treatment plan Patient reports feeling well at her baseline. She is going to get married in 2 weeks. She is accompanied by her fianc Ed today to the clinic. She denies weight loss, fatigue, night sweats, difficulty swallowing, shortness of breath, chest pain, abdominal pain or lower extremity swelling she denies fever or chills. She feels very anxious about her diagnosis as  well as the stress from the preparation of the wedding. She mention feeling some tightness of her neck without any tenderness.    Review of Systems  Constitutional: Negative.   HENT:  Negative.   Eyes: Negative.   Respiratory: Negative.   Cardiovascular: Negative.   Gastrointestinal: Negative.   Endocrine: Negative.   Genitourinary: Negative.    Musculoskeletal: Negative.   Skin: Negative.   Neurological: Negative.   Hematological: Negative.   Psychiatric/Behavioral: Negative.     MEDICAL HISTORY: Past Medical History:  Diagnosis Date  . Anemia   . Anxiety and depression   . Chicken pox   . Fibrocystic breast   . Heart murmur   . Hypertension   . Menorrhagia     SURGICAL HISTORY: Past Surgical History:  Procedure Laterality Date  . ABLATION  2008   endometrial ablation  . BREAST BIOPSY Right 2005   Fibroadenoma, duct adenoma, right breast or o'clock.  Marland Kitchen BREAST CYST ASPIRATION  12/2015  . COLONOSCOPY  08/25/2011   removed 3 mm polyp Dr. Vira Agar  . DILATION AND CURETTAGE OF UTERUS    . HYSTEROSCOPY    . WISDOM TOOTH EXTRACTION      SOCIAL HISTORY: Social History   Social History  . Marital status: Single    Spouse name: N/A  . Number of children: 2  . 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 Smoking status: Never Smoker  . Smokeless tobacco: Never Used  . Alcohol use 0.0 oz/week     Comment: rare  . Drug use: No  . Sexual activity: Not Currently    Birth control/ protection: Other-see comments  Comment: ablation   Other Topics Concern  . Not on file   Social History Narrative  . No narrative on file    FAMILY HISTORY Family History  Problem Relation Age of Onset  . Multiple sclerosis Mother   . Hypothyroidism Mother   . Diverticulosis Mother   . Hyperlipidemia Father   . Heart disease Father        Had MI 2016  . Hypertension Father   . Skin cancer Father   . Lung  cancer Maternal Aunt        66s  . Alcohol abuse Maternal Uncle   . Lung cancer Maternal Uncle        65s  . Stroke Maternal Grandmother   . Hypertension Maternal Grandmother   . Kidney disease Maternal Grandmother   . Diabetes Maternal Grandmother   . Hypertension Paternal Grandfather   . Colon cancer Paternal Grandfather   . Autoimmune disease Sister        alopecia   . Prostate cancer Maternal Grandfather   . Colon cancer Maternal Uncle     ALLERGIES:  is allergic to merthiolate [thimerosal]; sulfa antibiotics; and sulfasalazine.  MEDICATIONS:  Current Outpatient Prescriptions  Medication Sig Dispense Refill  . b complex vitamins tablet Take 1 tablet by mouth daily.    Marland Kitchen BIOTIN PO Take by mouth.    Marland Kitchen ibuprofen (ADVIL,MOTRIN) 200 MG tablet Take 200 mg by mouth every 6 (six) hours as needed.    . Multiple Minerals-Vitamins (CALCIUM & VIT D3 BONE HEALTH PO) Take by mouth.    . Wheat Dextrin (BENEFIBER PO) Take by mouth.     No current facility-administered medications for this visit.     PHYSICAL EXAMINATION:  ECOG PERFORMANCE STATUS: 0 - Asymptomatic  Vitals:   12/27/16 1134  BP: 115/69  Pulse: (!) 54  Resp: 16  Temp: 98.3 F (36.8 C)    Filed Weights   12/27/16 1134  Weight: 114 lb 6 oz (51.9 kg)     Physical Exam GENERAL: No distress, well nourished.  SKIN:  No rashes or significant lesions  HEAD: Normocephalic, No masses, lesions, tenderness or abnormalities  EYES: Conjunctiva are pink, non icteric ENT: External ears normal ,lips , buccal mucosa, and tongue normal and mucous membranes are moist  LYMPH: Small soft cervical lymph nodes bilateral, left supraclavicular node (+), I did not feel the axillary lymphadenopathy  LUNGS: Clear to auscultation, no crackles or wheezes HEART: Regular rate & rhythm, no murmurs, no gallops, S1 normal and S2 normal  ABDOMEN: Abdomen soft, non-tender, normal bowel sounds, I did not appreciate any  masses or organomegaly   MUSCULOSKELETAL: No CVA tenderness and no tenderness on percussion of the back or rib cage.  EXTREMITIES: No edema, no skin discoloration or tenderness NEURO: Alert & oriented, no focal motor/sensory deficits.    LABORATORY DATA: I have personally reviewed the data as listed: CBC    Component Value Date/Time   WBC 7.2 12/20/2016 1020   RBC 4.30 12/20/2016 1020   HGB 13.1 12/20/2016 1020   HCT 38.3 12/20/2016 1020   PLT 243 12/20/2016 1020   MCV 88.9 12/20/2016 1020   MCH 30.4 12/20/2016 1020   MCHC 34.2 12/20/2016 1020   RDW 13.0 12/20/2016 1020   CMP Latest Ref Rng & Units 12/07/2016 11/23/2016  Glucose 70 - 99 mg/dL 95 99  BUN 6 - 23 mg/dL 14 14  Creatinine 0.40 - 1.20 mg/dL 1.03 1.07  Sodium 135 - 145 mEq/L 138  138  Potassium 3.5 - 5.1 mEq/L 3.8 4.2  Chloride 96 - 112 mEq/L 103 105  CO2 19 - 32 mEq/L 29 25  Calcium 8.4 - 10.5 mg/dL 65.3 9.7  Total Protein 6.0 - 8.3 g/dL - 7.0  Total Bilirubin 0.2 - 1.2 mg/dL - 0.4  Alkaline Phos 39 - 117 U/L - 40  AST 0 - 37 U/L - 13  ALT 0 - 35 U/L - 8   Surgical Pathology 12/20/2016 CASE: [id number]  PATIENT: Gwyneth Roberts  Surgical Pathology Report DIAGNOSIS:  A. LYMPH NODE, RIGHT GROIN; NEEDLE CORE BIOPSY:  - FOLLICULAR LYMPHOMA, GRADE 1-2, SEE COMMENT.   Comment: A sample was sent for flow cytometry ([employer]/[facility], Accession [id number]) with the following interpretation: CD10+ clonal B-cell population detected.  The case was sent to [facility] for consultation. The findings of the external consultant Dr. Clifton James are incorporated above. This is the comment of the external consultant: Thank you for the opportunity to review this case. HE sections demonstrate small core needle biopsy tissue fragments with a dense lymphoid proliferation with a vaguely follicular architecture. The lymphocytes are composed of small mature-appearing forms. No significant population of large  lymphocytes is detected. Immunohistochemical stains are performed and evaluated to further characterize this lymphoid proliferation. CD3 marks T cells and negatively highlights B-cell rich follicles. CD20 marks numerous B-cell rich follicles, as well as numerous interfollicular B cells. The B cells are positive for CD10, BCL 6 and BCL-2. CD5 marks T cells without overt coexpression in B cells, while a cyclin D1 immunostain is negative in lymphocytes. CD23 highlights follicular dendritic cell mesh works underlying most of the follicles. Ki-67 showed a low proliferation index (5%).  Per report, flow cytometric analysis reveals a CD10 positive clonal B-cell population. For details, see [employer] report. The  morphologic and immunophenotypic findings support a diagnosis of follicular lymphoma, grade 1-2, with a follicular pattern in this small submitted sample. Selected slides from this case were reviewed and discussed at our hematopathology consensus conference on 12/23/16.      RADIOGRAPHIC STUDIES: I have personally reviewed the radiological images as listed and agree with the findings in the report CT Neck and chest with contrast 12/08/2016  IMPRESSION: Mild adenopathy in the left supraclavicular region and bilateral axillary/ subpectoral regions. Findings concerning for possible lymphoproliferative disorder.Small bilateral 3 mm lower lobe pulmonary nodules, nonspecific.Recommend attention on follow-up imaging. PET scan 12/10/2016 IMPRESSION: 1. Pathologically enlarged Deauville 4 left supraclavicular and left subpectoral and axillary adenopathy in the chest. Pathologically enlarged total for retroperitoneal and pelvic adenopathy in the abdomen. 2. Upper normal sized Deauville 2 scattered lymph nodes in the neck. 3. Other imaging findings of potential clinical significance: Fibroid uterus. Suspected scarring of the left kidney.  ASSESSMENT/PLAN Cancer Staging Follicular lymphoma of  lymph nodes of multiple regions [facility]) Staging form: Hodgkin and Non-Hodgkin Lymphoma, AJCC 8th Edition - Clinical stage from 12/27/2016: Stage III (Follicular lymphoma) - Signed by Rickard Patience, MD on 12/27/2016  CT scan, PET scan, pathology results were discussed with patient and her fianc. I discussed in detail with patient and her fianc regarding the diagnosis of low-grade follicular lymphoma involving both lymph nodes above and below the diaphragm, which gives her a stage III follicular lymphoma. Currently she does not have any symptoms or cytopenias. She does not have bulky disease. Her Philippi score is 2, one point from stage III, one point direct from morning for nodal groups. I discussed with patient that wait-watch/observation is recommended.  Immunotherapy base treatment is reserved for disease progression/symptoms. This is largely based on the prospective trials that have compared wait and watch down versus treatment and demonstrated no difference in overall survival with the deferred therapy. We can follow-up her in clinic every 3 months for the first year and then every 3-6 months thereafter until progressive disease is noted. We also discussed histology transformation of follicular lymphoma to more aggressive lymphoma occurs regardless of whether follicular lymphoma is treated aggressively or conservatively, and this is at a rate of approximately 2% per year. For now I suggest observation. Patient and her fianc [place] Mote questions and all questions answered to their satisfaction. They're going to focus on preparation of their wedding and then return to clinic to discuss more in the finalize the treatment plan. Check LDH today.   F/u in September with repeat labs.   Orders Placed This Encounter  Procedures  . Lactate dehydrogenase    Standing Status:   Future    Number of Occurrences:   1    Standing Expiration Date:   12/27/2017  . CBC with Differential/Platelet    Standing Status:   Future     Standing Expiration Date:   12/27/2017  . Comprehensive metabolic panel    Standing Status:   Future    Standing Expiration Date:   12/27/2017  . Lactate dehydrogenase    Standing Status:   Future    Standing Expiration Date:   12/27/2017    All questions were answered. The patient knows to call the clinic with any problems, questions or concerns. Thank you for this kind referral and the opportunity to participate in the care of this patient. A copy of today's note is routed to referring provider Dr.Oaks.     Earlie Server, MD  12/27/2016 12:19 PM

## 2016-12-27 NOTE — Progress Notes (Signed)
Patient here today as a new patient  

## 2016-12-29 ENCOUNTER — Other Ambulatory Visit: Payer: Self-pay | Admitting: Certified Nurse Midwife

## 2016-12-29 ENCOUNTER — Inpatient Hospital Stay: Payer: BC Managed Care – PPO | Admitting: Oncology

## 2016-12-29 DIAGNOSIS — N6310 Unspecified lump in the right breast, unspecified quadrant: Secondary | ICD-10-CM

## 2016-12-29 DIAGNOSIS — N6489 Other specified disorders of breast: Secondary | ICD-10-CM

## 2016-12-29 DIAGNOSIS — R928 Other abnormal and inconclusive findings on diagnostic imaging of breast: Secondary | ICD-10-CM

## 2017-01-03 ENCOUNTER — Ambulatory Visit
Admission: RE | Admit: 2017-01-03 | Discharge: 2017-01-03 | Disposition: A | Discharge status: Home / Self Care | Payer: BC Managed Care – PPO | Source: Ambulatory Visit | Attending: Certified Nurse Midwife | Admitting: Certified Nurse Midwife

## 2017-01-03 DIAGNOSIS — N6489 Other specified disorders of breast: Secondary | ICD-10-CM | POA: Diagnosis present

## 2017-01-03 DIAGNOSIS — N6310 Unspecified lump in the right breast, unspecified quadrant: Secondary | ICD-10-CM

## 2017-01-03 DIAGNOSIS — R928 Other abnormal and inconclusive findings on diagnostic imaging of breast: Secondary | ICD-10-CM

## 2017-01-03 DIAGNOSIS — N6001 Solitary cyst of right breast: Secondary | ICD-10-CM | POA: Diagnosis not present

## 2017-01-03 DIAGNOSIS — N6002 Solitary cyst of left breast: Secondary | ICD-10-CM | POA: Insufficient documentation

## 2017-01-05 ENCOUNTER — Other Ambulatory Visit: Payer: Self-pay | Admitting: *Deleted

## 2017-01-05 ENCOUNTER — Inpatient Hospital Stay
Admission: RE | Admit: 2017-01-05 | Discharge: 2017-01-05 | Disposition: A | Discharge status: Home / Self Care | Payer: Self-pay | Source: Ambulatory Visit | Attending: *Deleted | Admitting: *Deleted

## 2017-01-05 DIAGNOSIS — Z9289 Personal history of other medical treatment: Secondary | ICD-10-CM

## 2017-01-24 ENCOUNTER — Inpatient Hospital Stay: Payer: BC Managed Care – PPO | Attending: Oncology | Admitting: Oncology

## 2017-01-24 ENCOUNTER — Inpatient Hospital Stay: Payer: BC Managed Care – PPO

## 2017-01-24 ENCOUNTER — Encounter: Payer: Self-pay | Admitting: Oncology

## 2017-01-24 VITALS — BP 106/47 | HR 47 | Temp 98.1°F | Resp 16 | Wt 115.2 lb

## 2017-01-24 DIAGNOSIS — Z801 Family history of malignant neoplasm of trachea, bronchus and lung: Secondary | ICD-10-CM | POA: Insufficient documentation

## 2017-01-24 DIAGNOSIS — R918 Other nonspecific abnormal finding of lung field: Secondary | ICD-10-CM | POA: Diagnosis not present

## 2017-01-24 DIAGNOSIS — F418 Other specified anxiety disorders: Secondary | ICD-10-CM | POA: Diagnosis not present

## 2017-01-24 DIAGNOSIS — C8298 Follicular lymphoma, unspecified, lymph nodes of multiple sites: Secondary | ICD-10-CM

## 2017-01-24 DIAGNOSIS — Z800 Family history of malignant neoplasm of digestive organs: Secondary | ICD-10-CM | POA: Insufficient documentation

## 2017-01-24 DIAGNOSIS — Z79899 Other long term (current) drug therapy: Secondary | ICD-10-CM | POA: Insufficient documentation

## 2017-01-24 DIAGNOSIS — C8208 Follicular lymphoma grade I, lymph nodes of multiple sites: Secondary | ICD-10-CM | POA: Insufficient documentation

## 2017-01-24 DIAGNOSIS — R61 Generalized hyperhidrosis: Secondary | ICD-10-CM | POA: Insufficient documentation

## 2017-01-24 DIAGNOSIS — Z808 Family history of malignant neoplasm of other organs or systems: Secondary | ICD-10-CM | POA: Diagnosis not present

## 2017-01-24 DIAGNOSIS — R5382 Chronic fatigue, unspecified: Secondary | ICD-10-CM | POA: Insufficient documentation

## 2017-01-24 DIAGNOSIS — I10 Essential (primary) hypertension: Secondary | ICD-10-CM | POA: Insufficient documentation

## 2017-01-24 DIAGNOSIS — M542 Cervicalgia: Secondary | ICD-10-CM | POA: Diagnosis not present

## 2017-01-24 LAB — CBC WITH DIFFERENTIAL/PLATELET
BASOS ABS: 0.1 10*3/uL (ref 0–0.1)
BASOS PCT: 1 %
EOS ABS: 0.2 10*3/uL (ref 0–0.7)
Eosinophils Relative: 2 %
HEMATOCRIT: 35.8 % (ref 35.0–47.0)
Hemoglobin: 12.5 g/dL (ref 12.0–16.0)
Lymphocytes Relative: 16 %
Lymphs Abs: 1.1 10*3/uL (ref 1.0–3.6)
MCH: 30.9 pg (ref 26.0–34.0)
MCHC: 35 g/dL (ref 32.0–36.0)
MCV: 88.3 fL (ref 80.0–100.0)
MONO ABS: 0.5 10*3/uL (ref 0.2–0.9)
Monocytes Relative: 8 %
Neutro Abs: 5.1 10*3/uL (ref 1.4–6.5)
Neutrophils Relative %: 73 %
PLATELETS: 273 10*3/uL (ref 150–440)
RBC: 4.06 MIL/uL (ref 3.80–5.20)
RDW: 13.1 % (ref 11.5–14.5)
WBC: 7 10*3/uL (ref 3.6–11.0)

## 2017-01-24 LAB — COMPREHENSIVE METABOLIC PANEL
ALBUMIN: 4.1 g/dL (ref 3.5–5.0)
ALT: 15 U/L (ref 14–54)
AST: 24 U/L (ref 15–41)
Alkaline Phosphatase: 46 U/L (ref 38–126)
Anion gap: 7 (ref 5–15)
BUN: 19 mg/dL (ref 6–20)
CHLORIDE: 104 mmol/L (ref 101–111)
CO2: 23 mmol/L (ref 22–32)
Calcium: 9.1 mg/dL (ref 8.9–10.3)
Creatinine, Ser: 1.01 mg/dL — ABNORMAL HIGH (ref 0.44–1.00)
GFR calc Af Amer: >60 mL/min (ref >60)
GFR calc non Af Amer: >60 mL/min (ref >60)
GLUCOSE: 90 mg/dL (ref 65–99)
POTASSIUM: 4 mmol/L (ref 3.5–5.1)
SODIUM: 134 mmol/L — AB (ref 135–145)
Total Bilirubin: 0.7 mg/dL (ref 0.3–1.2)
Total Protein: 6.9 g/dL (ref 6.5–8.1)

## 2017-01-24 LAB — LACTATE DEHYDROGENASE: LDH: 147 U/L (ref 98–192)

## 2017-01-24 NOTE — Progress Notes (Signed)
[facility] Follow Up Visit.   Patient Care Team: Rusty Aus, MD as PCP - General (Internal Medicine) Dalia Heading, CNM as Midwife (Certified Nurse Midwife) Bary Castilla, Forest Gleason, MD (General Surgery) Minna Merritts, MD as Consulting Physician (Cardiology)  CHIEF COMPLAINTS/PURPOSE OF CONSULTATION: I have lymphoma  HISTORY OF PRESENTING ILLNESS: Carly Roberts 51 y.o. female who is referred by Dr.Oaks to [place] for evaluation and management of recently diagnosed follicular lymphoma. patientWas in her usual state of health until recently when she started to notice enlarging left neck mass she went to her primary care physician and ultrasound was done showing a few small lymph nodes. She continues to feel uncomfortableness in her neck and therefore a CT scan was performed. CT neck and chest with contrast on 12/08/2016 showed mild adenopathy in the left supraclavicular region and bilateral axillary/subpectoral small bilateral lower lobe nodules nonspecific. As followed by a PET scan on 12/10/2016 which showed pathologically enlarged Deauville 2 left supraclavicular and left subpectoral and axillary adenopathy in the chest. Pathologically enlarged nodes or retroperitoneal pelvic adenopathy in the abdomen. Normal size Deauville 2 scattered lymph nodes in the neck. Patient had ultrasound guided right inguinal node biopsy on 12/20/2016 and pathology showed follicular lymphoma, grade 1-2. KI 67 showed a low proliferation index 5%. Patient was referred to see me for discussion about treatment plan Patient reports feeling well at her baseline. She is going to get married in 2 weeks. She is accompanied by her fianc Ed today to the clinic. She denies weight loss, fatigue, night sweats, difficulty swallowing, shortness of breath, chest pain, abdominal pain or lower extremity swelling she denies fever or chills. She feels very anxious about her diagnosis as well as the stress from the  preparation of the wedding. She mention feeling some tightness of her neck without any tenderness.    INTERVAL HISTORY Patient presents for follow up visit, accompanied by her newly married husband. She had wedding at the end of August and just came back from honeymoon. She feels the same. She can feel her left neck lymph nodes without tenderness. And neck motion is not affected by lymphadenopathy. Her appetite is good.  Denies weight loss, drenching night sweat, profound fatigue. She has mild chronic fatigue and has had sweating at night for 2 occasions this year.    Review of Systems  Constitutional: Negative.   HENT:  Negative.   Eyes: Negative.   Respiratory: Negative.   Cardiovascular: Negative.   Gastrointestinal: Negative.   Endocrine: Negative.   Genitourinary: Negative.    Musculoskeletal: Negative.   Skin: Negative.   Neurological: Negative.   Hematological: Positive for adenopathy.  Psychiatric/Behavioral: Negative.     MEDICAL HISTORY: Past Medical History:  Diagnosis Date  . Anemia   . Anxiety and depression   . Cancer ([place]) 7026   follicular lymphoma  . Chicken pox   . Fibrocystic breast   . Heart murmur   . Hypertension   . Menorrhagia     SURGICAL HISTORY: Past Surgical History:  Procedure Laterality Date  . ABLATION  2008   endometrial ablation  . BREAST CYST ASPIRATION Bilateral 12/31/2015   FNA done by Dr. Bary Castilla  . BREAST EXCISIONAL BIOPSY Right 2005   Fibroadenoma, duct adenoma, right breast or o'clock.  . COLONOSCOPY  08/25/2011   removed 3 mm polyp Dr. Vira Agar  . DILATION AND CURETTAGE OF UTERUS    . HYSTEROSCOPY    . WISDOM TOOTH EXTRACTION  SOCIAL HISTORY: Social History   Social History  . Marital status: Single    Spouse name: N/A  . Number of children: 2  . 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 Smoking status: Never Smoker  . Smokeless tobacco: Never  Used  . Alcohol use 0.0 oz/week     Comment: rare  . Drug use: No  . Sexual activity: Not Currently    Birth control/ protection: Other-see comments     Comment: ablation   Other Topics Concern  . Not on file   Social History Narrative  . No narrative on file    FAMILY HISTORY Family History  Problem Relation Age of Onset  . Multiple sclerosis Mother   . Hypothyroidism Mother   . Diverticulosis Mother   . Hyperlipidemia Father   . Heart disease Father        Had MI 2016  . Hypertension Father   . Skin cancer Father   . Lung cancer Maternal Aunt        23s  . Alcohol abuse Maternal Uncle   . Lung cancer Maternal Uncle        16s  . Stroke Maternal Grandmother   . Hypertension Maternal Grandmother   . Kidney disease Maternal Grandmother   . Diabetes Maternal Grandmother   . Hypertension Paternal Grandfather   . Colon cancer Paternal Grandfather   . Autoimmune disease Sister        alopecia   . Prostate cancer Maternal Grandfather   . Colon cancer Maternal Uncle     ALLERGIES:  is allergic to merthiolate [thimerosal]; sulfa antibiotics; and sulfasalazine.  MEDICATIONS:  Current Outpatient Prescriptions  Medication Sig Dispense Refill  . b complex vitamins tablet Take 1 tablet by mouth daily.    Marland Kitchen BIOTIN PO Take by mouth.    Marland Kitchen ibuprofen (ADVIL,MOTRIN) 200 MG tablet Take 200 mg by mouth every 6 (six) hours as needed.    . Multiple Minerals-Vitamins (CALCIUM & VIT D3 BONE HEALTH PO) Take by mouth.    . Wheat Dextrin (BENEFIBER PO) Take by mouth.     No current facility-administered medications for this visit.     PHYSICAL EXAMINATION:  ECOG PERFORMANCE STATUS: 0 - Asymptomatic  Vitals:   01/24/17 1146  BP: (!) 106/47  Pulse: (!) 47  Resp: 16  Temp: 98.1 F (36.7 C)    Filed Weights   01/24/17 1146  Weight: 115 lb 3 oz (52.2 kg)     Physical Exam GENERAL: No distress, well nourished.  SKIN:  No rashes or significant lesions  HEAD: Normocephalic,  No masses, lesions, tenderness or abnormalities  EYES: Conjunctiva are pink, non icteric ENT: External ears normal ,lips , buccal mucosa, and tongue normal and mucous membranes are moist  LYMPH: Small soft cervical lymph nodes bilateral, left supraclavicular node (+), I did not feel the axillary lymphadenopathy  LUNGS: Clear to auscultation, no crackles or wheezes HEART: Regular rate & rhythm, no murmurs, no gallops, S1 normal and S2 normal  ABDOMEN: Abdomen soft, non-tender, normal bowel sounds, I did not appreciate any  masses or organomegaly  MUSCULOSKELETAL: No CVA tenderness and no tenderness on percussion of the back or rib cage.  EXTREMITIES: No edema, no skin discoloration or tenderness NEURO: Alert & oriented, no focal motor/sensory deficits.    LABORATORY DATA: I have personally reviewed the data as listed: CBC    Component Value Date/Time  WBC 7.0 01/24/2017 1125   RBC 4.06 01/24/2017 1125   HGB 12.5 01/24/2017 1125   HCT 35.8 01/24/2017 1125   PLT 273 01/24/2017 1125   MCV 88.3 01/24/2017 1125   MCH 30.9 01/24/2017 1125   MCHC 35.0 01/24/2017 1125   RDW 13.1 01/24/2017 1125   LYMPHSABS 1.1 01/24/2017 1125   MONOABS 0.5 01/24/2017 1125   EOSABS 0.2 01/24/2017 1125   BASOSABS 0.1 01/24/2017 1125   CMP Latest Ref Rng & Units 01/24/2017 12/07/2016 11/23/2016  Glucose 65 - 99 mg/dL 90 95 99  BUN 6 - 20 mg/dL '19 14 14  '$ Creatinine 0.44 - 1.00 mg/dL 1.01(H) 1.03 1.07  Sodium 135 - 145 mmol/L 134(L) 138 138  Potassium 3.5 - 5.1 mmol/L 4.0 3.8 4.2  Chloride 101 - 111 mmol/L 104 103 105  CO2 22 - 32 mmol/L '23 29 25  '$ Calcium 8.9 - 10.3 mg/dL 9.1 10.0 9.7  Total Protein 6.5 - 8.1 g/dL 6.9 - 7.0  Total Bilirubin 0.3 - 1.2 mg/dL 0.7 - 0.4  Alkaline Phos 38 - 126 U/L 46 - 40  AST 15 - 41 U/L 24 - 13  ALT 14 - 54 U/L 15 - 8    Surgical Pathology 12/20/2016 CASE: [id number]  PATIENT: Carly Roberts  Surgical Pathology Report DIAGNOSIS:  A. LYMPH NODE, RIGHT GROIN; NEEDLE  CORE BIOPSY:  - FOLLICULAR LYMPHOMA, GRADE 1-2, SEE COMMENT.   Comment: A sample was sent for flow cytometry ([employer], Accession [id number]) with the following interpretation: CD10+ clonal B-cell population detected.  The case was sent to [facility] for consultation. The findings of the external consultant Dr. Angelena Form are incorporated above. This is the comment of the external consultant: Thank you for the opportunity to review this case. HE sections demonstrate small core needle biopsy tissue fragments with a dense lymphoid proliferation with a vaguely follicular architecture. The lymphocytes are composed of small mature-appearing forms. No significant population of large lymphocytes is detected. Immunohistochemical stains are performed and evaluated to further characterize this lymphoid proliferation. CD3 marks T cells and negatively highlights B-cell rich follicles. CD20 marks numerous B-cell rich follicles, as well as numerous interfollicular B cells. The B cells are positive for CD10, BCL 6 and BCL-2. CD5 marks T cells without overt coexpression in B cells, while a cyclin D1 immunostain is negative in lymphocytes. OH60 highlights follicular dendritic cell mesh works underlying most of the follicles. Ki-67 showed a low proliferation index (5%).  Per report, flow cytometric analysis reveals a CD10 positive clonal B-cell population. For details, see [facility] report. The  morphologic and immunophenotypic findings support a diagnosis of follicular lymphoma, grade 1-2, with a follicular pattern in this small submitted sample. Selected slides from this case were reviewed and discussed at our hematopathology consensus conference on 12/23/16.      RADIOGRAPHIC STUDIES: I have personally reviewed the radiological images as listed and agree with the findings in the report CT Neck and chest with contrast 12/08/2016  IMPRESSION: Mild  adenopathy in the left supraclavicular region and bilateral axillary/ subpectoral regions. Findings concerning for possible lymphoproliferative disorder.Small bilateral 3 mm lower lobe pulmonary nodules, nonspecific.Recommend attention on follow-up imaging. PET scan 12/10/2016 IMPRESSION: 1. Pathologically enlarged Deauville 4 left supraclavicular and left subpectoral and axillary adenopathy in the chest. Pathologically enlarged total for retroperitoneal and pelvic adenopathy in the abdomen. 2. Upper normal sized Deauville 2 scattered lymph nodes in the neck. 3. Other imaging findings of potential clinical significance: Fibroid uterus. Suspected  scarring of the left kidney.  ASSESSMENT/PLAN Cancer Staging Follicular lymphoma of lymph nodes of multiple regions [facility]) Staging form: Hodgkin and Non-Hodgkin Lymphoma, AJCC 8th Edition - Clinical stage from 12/17/2334: Stage III (Follicular lymphoma) - Signed by Earlie Server, MD on 12/27/2016  CT scan, PET scan, pathology results were discussed again with patient and her husband.  I discussed in detail about the diagnosis of low-grade follicular lymphoma involving both lymph nodes above and below the diaphragm, which gives her a stage III follicular lymphoma. Currently she does not have any consitutional symptoms or cytopenias. She does not have bulky disease. Her FLIPI score is 2, one point from stage III, one point direct from morning for nodal groups.  # I discussed with patient that wait-watch/observation is recommended. This is largely based on the prospective trials that have compared wait and watch down versus treatment and demonstrated no difference in overall survival with the deferred therapy.We also discussed histology transformation of follicular lymphoma to more aggressive lymphoma occurs regardless of whether follicular lymphoma is treated aggressively or conservatively, and this is at a rate of approximately 2% per year. # We can follow-up her in  clinic every 3 months for the first year and then every 3-6 months thereafter until progressive disease is noted.  For now I suggest observation. Patient and her husband Rica Mote questions and all questions answered to their satisfaction. Patient agrees with the plan of watchful waiting.  Will test hepatitis panel and HIV.   F/u in 3 months with repeat labs (CBC,CMP, LDH).   Orders Placed This Encounter  Procedures  . Lactate dehydrogenase    Standing Status:   Future    Standing Expiration Date:   01/24/2018  . CBC with Differential/Platelet    Standing Status:   Future    Standing Expiration Date:   01/24/2018  . Comprehensive metabolic panel    Standing Status:   Future    Standing Expiration Date:   01/24/2018    All questions were answered. The patient knows to call the clinic with any problems, questions or concerns. Thank you for this kind referral and the opportunity to participate in the care of this patient. A copy of today's note is routed to referring provider Dr.Oaks.     Earlie Server, MD  01/24/2017 12:10 PM

## 2017-01-24 NOTE — Progress Notes (Signed)
Patient here today for follow up.  Patient states no new concerns today  

## 2017-02-09 ENCOUNTER — Ambulatory Visit: Payer: BC Managed Care – PPO | Admitting: Internal Medicine

## 2017-04-25 ENCOUNTER — Inpatient Hospital Stay: Payer: BC Managed Care – PPO

## 2017-04-25 ENCOUNTER — Inpatient Hospital Stay: Payer: BC Managed Care – PPO | Admitting: Oncology

## 2017-04-28 NOTE — Progress Notes (Signed)
[facility] Follow Up Visit.   Patient Care Team: Rusty Aus, MD as PCP - General (Internal Medicine) Dalia Heading, CNM as Midwife (Certified Nurse Midwife) Bary Castilla, Forest Gleason, MD (General Surgery) Minna Merritts, MD as Consulting Physician (Cardiology)  REASON FOR VISIT Follow up for management of follicular lymphoma.    HISTORY OF PRESENTING ILLNESS: Carly Roberts 51 y.o. female who is referred by Dr.Oaks to [place] for evaluation and management of recently diagnosed follicular lymphoma. patientWas in her usual state of health until recently when she started to notice enlarging left neck mass she went to her primary care physician and ultrasound was done showing a few small lymph nodes. She continues to feel uncomfortableness in her neck and therefore a CT scan was performed. CT neck and chest with contrast on 12/08/2016 showed mild adenopathy in the left supraclavicular region and bilateral axillary/subpectoral small bilateral lower lobe nodules nonspecific. As followed by a PET scan on 12/10/2016 which showed pathologically enlarged Deauville 2 left supraclavicular and left subpectoral and axillary adenopathy in the chest. Pathologically enlarged nodes or retroperitoneal pelvic adenopathy in the abdomen. Normal size Deauville 2 scattered lymph nodes in the neck. Patient had ultrasound guided right inguinal node biopsy on 12/20/2016 and pathology showed follicular lymphoma, grade 1-2. KI 67 showed a low proliferation index 5%. Patient was referred to see me for discussion about treatment plan   INTERVAL HISTORY Patient presents for follow up visit, accompanied by her  husband. She reports feeling that her cervical lymph nodes wax and wane, sometimes causing tightness, and tender in the morning. Her neck motion was not affected by lymphadenopathy. Her appetite is good and weight is stable. Denies any drenching night sweats, profound fatigue.   Review of Systems   Constitutional: Negative for appetite change and chills.  HENT:   Negative for hearing loss and lump/mass.   Eyes: Negative for eye problems.  Respiratory: Negative for chest tightness.   Cardiovascular: Negative for chest pain.  Gastrointestinal: Negative for abdominal distention.  Endocrine: Negative for hot flashes.  Genitourinary: Negative for difficulty urinating.   Musculoskeletal: Negative for arthralgias.  Skin: Negative for itching.  Neurological: Negative for dizziness.  Hematological: Positive for adenopathy.  Psychiatric/Behavioral: The patient is not nervous/anxious.     MEDICAL HISTORY: Past Medical History:  Diagnosis Date  . Anemia   . Anxiety and depression   . Cancer ([place]) 9528   follicular lymphoma  . Chicken pox   . Fibrocystic breast   . Heart murmur   . Hypertension   . Menorrhagia     SURGICAL HISTORY: Past Surgical History:  Procedure Laterality Date  . ABLATION  2008   endometrial ablation  . BREAST CYST ASPIRATION Bilateral 12/31/2015   FNA done by Dr. Bary Castilla  . BREAST EXCISIONAL BIOPSY Right 2005   Fibroadenoma, duct adenoma, right breast or o'clock.  . COLONOSCOPY  08/25/2011   removed 3 mm polyp Dr. Vira Agar  . DILATION AND CURETTAGE OF UTERUS    . HYSTEROSCOPY    . WISDOM TOOTH EXTRACTION      SOCIAL HISTORY: Social History   Socioeconomic History  . Marital status: Single    Spouse name: Not on file  . Number of children: 2  . Years of education: Not on file  . Highest education level: Not on file  Social Needs  . Financial resource strain: Not on file  . Food insecurity - worry: Not on file  . Food insecurity - inability: Not  on file  . Transportation needs - medical: Not on file  . Transportation needs - non-medical: Not on file  Occupational History  . Occupation: Armed forces training and education officer  Tobacco Use  . Smoking status: Never Smoker  . Smokeless tobacco: Never Used  Substance and Sexual Activity  . Alcohol use: Yes     Alcohol/week: 0.0 oz    Comment: rare  . Drug use: No  . Sexual activity: Not Currently    Birth control/protection: Other-see comments    Comment: ablation  Other Topics Concern  . Not on file  Social History Narrative  . Not on file    FAMILY HISTORY Family History  Problem Relation Age of Onset  . Multiple sclerosis Mother   . Hypothyroidism Mother   . Diverticulosis Mother   . Hyperlipidemia Father   . Heart disease Father        Had MI 2016  . Hypertension Father   . Skin cancer Father   . Lung cancer Maternal Aunt        33s  . Alcohol abuse Maternal Uncle   . Lung cancer Maternal Uncle        75s  . Stroke Maternal Grandmother   . Hypertension Maternal Grandmother   . Kidney disease Maternal Grandmother   . Diabetes Maternal Grandmother   . Hypertension Paternal Grandfather   . Colon cancer Paternal Grandfather   . Autoimmune disease Sister        alopecia   . Prostate cancer Maternal Grandfather   . Colon cancer Maternal Uncle     ALLERGIES:  is allergic to merthiolate [thimerosal]; sulfa antibiotics; and sulfasalazine.  MEDICATIONS:  Current Outpatient Medications  Medication Sig Dispense Refill  . b complex vitamins tablet Take 1 tablet by mouth daily.    Marland Kitchen BIOTIN PO Take by mouth.    Marland Kitchen ibuprofen (ADVIL,MOTRIN) 200 MG tablet Take 200 mg by mouth every 6 (six) hours as needed.    . Multiple Minerals-Vitamins (CALCIUM & VIT D3 BONE HEALTH PO) Take by mouth.    . Wheat Dextrin (BENEFIBER PO) Take by mouth.     No current facility-administered medications for this visit.     PHYSICAL EXAMINATION:  ECOG PERFORMANCE STATUS: 0 - Asymptomatic  Vitals:   04/29/17 1158  BP: (!) 145/71  Pulse: (!) 47  Temp: (!) 97.1 F (36.2 C)    Filed Weights   04/29/17 1158  Weight: 119 lb 8 oz (54.2 kg)     Physical Exam  GENERAL: No distress, well nourished.  SKIN:  No rashes or significant lesions  HEAD: Normocephalic, No masses, lesions, tenderness  or abnormalities  EYES: Conjunctiva are pink, non icteric ENT: External ears normal ,lips , buccal mucosa, and tongue normal and mucous membranes are moist  LYMPH: Small soft cervical lymph nodes bilateral, left supraclavicular node (+),  LUNGS: Clear to auscultation, no crackles or wheezes HEART: Regular rate & rhythm, no murmurs, no gallops, S1 normal and S2 normal  ABDOMEN: Abdomen soft, non-tender, normal bowel sounds, I did not appreciate any  masses or organomegaly  MUSCULOSKELETAL: No CVA tenderness and no tenderness on percussion of the back or rib cage.  EXTREMITIES: No edema, no skin discoloration or tenderness NEURO: Alert & oriented, no focal motor/sensory deficits.   LABORATORY DATA: I have personally reviewed the data as listed: CBC    Component Value Date/Time   WBC 7.0 01/24/2017 1125   RBC 4.06 01/24/2017 1125   HGB 12.5 01/24/2017 1125   HCT  35.8 01/24/2017 1125   PLT 273 01/24/2017 1125   MCV 88.3 01/24/2017 1125   MCH 30.9 01/24/2017 1125   MCHC 35.0 01/24/2017 1125   RDW 13.1 01/24/2017 1125   LYMPHSABS 1.1 01/24/2017 1125   MONOABS 0.5 01/24/2017 1125   EOSABS 0.2 01/24/2017 1125   BASOSABS 0.1 01/24/2017 1125   CMP Latest Ref Rng & Units 01/24/2017 12/07/2016 11/23/2016  Glucose 65 - 99 mg/dL 90 95 99  BUN 6 - 20 mg/dL _0 Creatinine 0.44 - 1.00 mg/dL 1.01(H) 1.03 1.07  Sodium 135 - 145 mmol/L 134(L) 138 138  Potassium 3.5 - 5.1 mmol/L 4.0 3.8 4.2  Chloride 101 - 111 mmol/L 104 103 105  CO2 22 - 32 mmol/L _1 Calcium 8.9 - 10.3 mg/dL 9.1 10.0 9.7  Total Protein 6.5 - 8.1 g/dL 6.9 - 7.0  Total Bilirubin 0.3 - 1.2 mg/dL 0.7 - 0.4  Alkaline Phos 38 - 126 U/L 46 - 40  AST 15 - 41 U/L 24 - 13  ALT 14 - 54 U/L 15 - 8    Surgical Pathology 12/20/2016 CASE: [id number]  PATIENT: Bekki LONG  Surgical Pathology Report DIAGNOSIS:  A. LYMPH NODE, RIGHT GROIN; NEEDLE CORE BIOPSY:  - FOLLICULAR LYMPHOMA, GRADE 1-2, SEE COMMENT.   Comment: A  sample was sent for flow cytometry ([employer]/[facility], Accession [id number]) with the following interpretation: CD10+ clonal B-cell population detected.  The case was sent to [facility] for consultation. The findings of the external consultant Dr. Angelena Form are incorporated above. This is the comment of the external consultant: Thank you for the opportunity to review this case. HE sections demonstrate small core needle biopsy tissue fragments with a dense lymphoid proliferation with a vaguely follicular architecture. The lymphocytes are composed of small mature-appearing forms. No significant population of large lymphocytes is detected. Immunohistochemical stains are performed and evaluated to further characterize this lymphoid proliferation. CD3 marks T cells and negatively highlights B-cell rich follicles. CD20 marks numerous B-cell rich follicles, as well as numerous interfollicular B cells. The B cells are positive for CD10, BCL 6 and BCL-2. CD5 marks T cells without overt coexpression in B cells, while a cyclin D1 immunostain is negative in lymphocytes. FY10 highlights follicular dendritic cell mesh works underlying most of the follicles. Ki-67 showed a low proliferation index (5%).  Per report, flow cytometric analysis reveals a CD10 positive clonal B-cell population. For details, see [facility] report. The  morphologic and immunophenotypic findings support a diagnosis of follicular lymphoma, grade 1-2, with a follicular pattern in this small submitted sample. Selected slides from this case were reviewed and discussed at our hematopathology consensus conference on 12/23/16.      RADIOGRAPHIC STUDIES: I have personally reviewed the radiological images as listed and agree with the findings in the report CT Neck and chest with contrast 12/08/2016  IMPRESSION: Mild adenopathy in the left supraclavicular region and bilateral axillary/ subpectoral  regions. Findings concerning for possible lymphoproliferative disorder.Small bilateral 3 mm lower lobe pulmonary nodules, nonspecific.Recommend attention on follow-up imaging. PET scan 12/10/2016 IMPRESSION: 1. Pathologically enlarged Deauville 4 left supraclavicular and left subpectoral and axillary adenopathy in the chest. Pathologically enlarged total for retroperitoneal and pelvic adenopathy in the abdomen. 2. Upper normal sized Deauville 2 scattered lymph nodes in the neck. 3. Other imaging findings of potential clinical significance: Fibroid uterus. Suspected scarring of the left kidney.  ASSESSMENT/PLAN Cancer Staging Follicular lymphoma of lymph nodes of multiple regions [facility]) Staging  form: Hodgkin and Non-Hodgkin Lymphoma, AJCC 8th Edition - Clinical stage from 4/81/8563: Stage III (Follicular lymphoma) - Signed by Earlie Server, MD on 12/27/2016  1. 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 Tightness of neck   3. Cervical lymphadenopathy    Stage III follicular lymphoma.  Currently she does not have any B symptoms, cytopenia. Organomegaly. FLIPI score is 2, one point from stage III disease, one point from multiple groups of lymph node involvement. The only symptoms she has is the neck tightness, and largely anxiety. With had discussion previously regarding follicular lymphoma management with watchful waiting. At that point we decided to have history and physical examination every 3 months. Today she appears more anxious about wax and waning size of cervical lymphadenopathy and neck tightness.   We discussed again about the wait  and watch/observation. This is largely based on the prospective trials that have compared wait and watch versus treatment and demonstrated no difference in overall survival with the deferred therapy. The transformation to a more aggressive lymphoma occurs regardless of whether follicular lymphoma is being  treated aggressively or conservatively. The rate about transformation is about 2% per year. We discussed about the role of radiation therapy in the stage III setting is limited. Immunotherapy based therapy such as Rituximab can be considered if her local symptoms progress.   # Patient asked if she could obtain a second opinion. I encouraged her to do so. We will refer patient to Dr.Dittus at [facility]. Will hold any additional images at this point. Patient will call us after she was being seen at [facility] and we will reformulated the management plan.  # Patient and her husband Rica Mote questions and all questions answered to their satisfaction. Patient agrees with the plan of watchful waiting.  # check hepatitis panel and HIV.   The patient knows to call the clinic with any problems, questions or concerns. Thank you for this kind referral and the opportunity to participate in the care of this patient. A copy of today's note is routed to referring provider Dr.Oaks.     Earlie Server, MD, PhD Hematology Oncology [facility] Pager- [phone] 04/29/2017

## 2017-04-29 ENCOUNTER — Encounter: Payer: Self-pay | Admitting: Oncology

## 2017-04-29 ENCOUNTER — Inpatient Hospital Stay (HOSPITAL_BASED_OUTPATIENT_CLINIC_OR_DEPARTMENT_OTHER): Payer: BC Managed Care – PPO | Admitting: Oncology

## 2017-04-29 ENCOUNTER — Inpatient Hospital Stay: Payer: BC Managed Care – PPO | Attending: Oncology

## 2017-04-29 VITALS — BP 145/71 | HR 47 | Temp 97.1°F | Wt 119.5 lb

## 2017-04-29 DIAGNOSIS — R918 Other nonspecific abnormal finding of lung field: Secondary | ICD-10-CM

## 2017-04-29 DIAGNOSIS — R29898 Other symptoms and signs involving the musculoskeletal system: Secondary | ICD-10-CM

## 2017-04-29 DIAGNOSIS — F329 Major depressive disorder, single episode, unspecified: Secondary | ICD-10-CM | POA: Insufficient documentation

## 2017-04-29 DIAGNOSIS — D649 Anemia, unspecified: Secondary | ICD-10-CM | POA: Insufficient documentation

## 2017-04-29 DIAGNOSIS — I10 Essential (primary) hypertension: Secondary | ICD-10-CM

## 2017-04-29 DIAGNOSIS — R590 Localized enlarged lymph nodes: Secondary | ICD-10-CM

## 2017-04-29 DIAGNOSIS — N920 Excessive and frequent menstruation with regular cycle: Secondary | ICD-10-CM | POA: Diagnosis not present

## 2017-04-29 DIAGNOSIS — Z79899 Other long term (current) drug therapy: Secondary | ICD-10-CM

## 2017-04-29 DIAGNOSIS — F419 Anxiety disorder, unspecified: Secondary | ICD-10-CM | POA: Diagnosis not present

## 2017-04-29 DIAGNOSIS — R011 Cardiac murmur, unspecified: Secondary | ICD-10-CM

## 2017-04-29 DIAGNOSIS — Z8619 Personal history of other infectious and parasitic diseases: Secondary | ICD-10-CM | POA: Diagnosis not present

## 2017-04-29 DIAGNOSIS — C8298 Follicular lymphoma, unspecified, lymph nodes of multiple sites: Secondary | ICD-10-CM

## 2017-04-29 DIAGNOSIS — Z801 Family history of malignant neoplasm of trachea, bronchus and lung: Secondary | ICD-10-CM | POA: Insufficient documentation

## 2017-04-29 LAB — COMPREHENSIVE METABOLIC PANEL
ALT: 15 U/L (ref 14–54)
ANION GAP: 6 (ref 5–15)
AST: 21 U/L (ref 15–41)
Albumin: 4.1 g/dL (ref 3.5–5.0)
Alkaline Phosphatase: 41 U/L (ref 38–126)
BUN: 15 mg/dL (ref 6–20)
CO2: 27 mmol/L (ref 22–32)
Calcium: 9.9 mg/dL (ref 8.9–10.3)
Chloride: 102 mmol/L (ref 101–111)
Creatinine, Ser: 0.94 mg/dL (ref 0.44–1.00)
Glucose, Bld: 105 mg/dL — ABNORMAL HIGH (ref 65–99)
Potassium: 4.1 mmol/L (ref 3.5–5.1)
SODIUM: 135 mmol/L (ref 135–145)
TOTAL PROTEIN: 7.1 g/dL (ref 6.5–8.1)
Total Bilirubin: 0.7 mg/dL (ref 0.3–1.2)

## 2017-04-29 LAB — CBC WITH DIFFERENTIAL/PLATELET
BASOS ABS: 0.1 10*3/uL (ref 0–0.1)
BASOS PCT: 1 %
Eosinophils Absolute: 0.1 10*3/uL (ref 0–0.7)
Eosinophils Relative: 1 %
HEMATOCRIT: 39.1 % (ref 35.0–47.0)
HEMOGLOBIN: 12.9 g/dL (ref 12.0–16.0)
Lymphocytes Relative: 15 %
Lymphs Abs: 1 10*3/uL (ref 1.0–3.6)
MCH: 30 pg (ref 26.0–34.0)
MCHC: 33.1 g/dL (ref 32.0–36.0)
MCV: 90.7 fL (ref 80.0–100.0)
Monocytes Absolute: 0.4 10*3/uL (ref 0.2–0.9)
Monocytes Relative: 6 %
NEUTROS ABS: 5.2 10*3/uL (ref 1.4–6.5)
NEUTROS PCT: 77 %
Platelets: 284 10*3/uL (ref 150–440)
RBC: 4.31 MIL/uL (ref 3.80–5.20)
RDW: 13.3 % (ref 11.5–14.5)
WBC: 6.8 10*3/uL (ref 3.6–11.0)

## 2017-04-29 LAB — LACTATE DEHYDROGENASE: LDH: 133 U/L (ref 98–192)

## 2017-04-29 NOTE — Progress Notes (Signed)
Patient here today for follow up.  Patient c/o neck swelling being very tender and uncomfortable, requesting something to help decrease the tenderness, also c/o indigestion daily

## 2017-04-30 LAB — HEPATITIS PANEL, ACUTE
HCV Ab: <0.1 {s_co_ratio} (ref 0.0–0.9)
Hep A IgM: NEGATIVE
Hep B C IgM: NEGATIVE
Hepatitis B Surface Ag: NEGATIVE

## 2017-04-30 LAB — HIV ANTIBODY (ROUTINE TESTING W REFLEX): HIV SCREEN 4TH GENERATION: NONREACTIVE

## 2017-05-11 ENCOUNTER — Encounter: Payer: Self-pay | Admitting: *Deleted

## 2017-05-11 ENCOUNTER — Encounter: Payer: Self-pay | Admitting: Oncology

## 2017-05-31 ENCOUNTER — Other Ambulatory Visit: Payer: Self-pay | Admitting: Oncology

## 2017-05-31 DIAGNOSIS — C8290 Follicular lymphoma, unspecified, unspecified site: Secondary | ICD-10-CM

## 2017-06-02 ENCOUNTER — Encounter: Payer: Self-pay | Admitting: Oncology

## 2017-06-08 ENCOUNTER — Ambulatory Visit: Payer: BC Managed Care – PPO

## 2017-06-10 ENCOUNTER — Ambulatory Visit: Payer: BC Managed Care – PPO | Admitting: Oncology

## 2017-06-10 ENCOUNTER — Other Ambulatory Visit: Payer: BC Managed Care – PPO

## 2017-07-19 ENCOUNTER — Other Ambulatory Visit: Payer: Self-pay | Admitting: Orthopedic Surgery

## 2017-07-19 DIAGNOSIS — M25551 Pain in right hip: Secondary | ICD-10-CM

## 2017-08-01 ENCOUNTER — Ambulatory Visit
Admission: RE | Admit: 2017-08-01 | Discharge: 2017-08-01 | Disposition: A | Discharge status: Home / Self Care | Payer: BC Managed Care – PPO | Source: Ambulatory Visit | Attending: Orthopedic Surgery | Admitting: Orthopedic Surgery

## 2017-08-01 DIAGNOSIS — S73191A Other sprain of right hip, initial encounter: Secondary | ICD-10-CM | POA: Insufficient documentation

## 2017-08-01 DIAGNOSIS — D259 Leiomyoma of uterus, unspecified: Secondary | ICD-10-CM | POA: Insufficient documentation

## 2017-08-01 DIAGNOSIS — M25551 Pain in right hip: Secondary | ICD-10-CM | POA: Diagnosis present

## 2017-08-01 DIAGNOSIS — M1611 Unilateral primary osteoarthritis, right hip: Secondary | ICD-10-CM | POA: Insufficient documentation

## 2017-08-01 DIAGNOSIS — X58XXXA Exposure to other specified factors, initial encounter: Secondary | ICD-10-CM | POA: Diagnosis not present

## 2017-08-29 ENCOUNTER — Inpatient Hospital Stay (HOSPITAL_BASED_OUTPATIENT_CLINIC_OR_DEPARTMENT_OTHER): Payer: BC Managed Care – PPO | Admitting: Oncology

## 2017-08-29 ENCOUNTER — Encounter: Payer: Self-pay | Admitting: Oncology

## 2017-08-29 ENCOUNTER — Inpatient Hospital Stay: Payer: BC Managed Care – PPO | Attending: Oncology

## 2017-08-29 VITALS — BP 110/62 | HR 48 | Temp 98.1°F | Resp 16 | Wt 120.0 lb

## 2017-08-29 DIAGNOSIS — Z8042 Family history of malignant neoplasm of prostate: Secondary | ICD-10-CM | POA: Insufficient documentation

## 2017-08-29 DIAGNOSIS — I10 Essential (primary) hypertension: Secondary | ICD-10-CM

## 2017-08-29 DIAGNOSIS — R918 Other nonspecific abnormal finding of lung field: Secondary | ICD-10-CM | POA: Diagnosis not present

## 2017-08-29 DIAGNOSIS — F419 Anxiety disorder, unspecified: Secondary | ICD-10-CM | POA: Diagnosis not present

## 2017-08-29 DIAGNOSIS — C8290 Follicular lymphoma, unspecified, unspecified site: Secondary | ICD-10-CM

## 2017-08-29 DIAGNOSIS — C8298 Follicular lymphoma, unspecified, lymph nodes of multiple sites: Secondary | ICD-10-CM | POA: Insufficient documentation

## 2017-08-29 DIAGNOSIS — Z808 Family history of malignant neoplasm of other organs or systems: Secondary | ICD-10-CM | POA: Diagnosis not present

## 2017-08-29 DIAGNOSIS — R011 Cardiac murmur, unspecified: Secondary | ICD-10-CM | POA: Insufficient documentation

## 2017-08-29 DIAGNOSIS — Z800 Family history of malignant neoplasm of digestive organs: Secondary | ICD-10-CM | POA: Insufficient documentation

## 2017-08-29 DIAGNOSIS — Z801 Family history of malignant neoplasm of trachea, bronchus and lung: Secondary | ICD-10-CM

## 2017-08-29 LAB — CBC WITH DIFFERENTIAL/PLATELET
BASOS ABS: 0.1 10*3/uL (ref 0–0.1)
Basophils Relative: 1 %
Eosinophils Absolute: 0.1 10*3/uL (ref 0–0.7)
Eosinophils Relative: 2 %
HEMATOCRIT: 36.8 % (ref 35.0–47.0)
Hemoglobin: 12.8 g/dL (ref 12.0–16.0)
LYMPHS ABS: 1 10*3/uL (ref 1.0–3.6)
LYMPHS PCT: 17 %
MCH: 30.8 pg (ref 26.0–34.0)
MCHC: 34.7 g/dL (ref 32.0–36.0)
MCV: 88.8 fL (ref 80.0–100.0)
MONO ABS: 0.5 10*3/uL (ref 0.2–0.9)
Monocytes Relative: 9 %
NEUTROS ABS: 4.3 10*3/uL (ref 1.4–6.5)
Neutrophils Relative %: 71 %
Platelets: 265 10*3/uL (ref 150–440)
RBC: 4.15 MIL/uL (ref 3.80–5.20)
RDW: 13.2 % (ref 11.5–14.5)
WBC: 6 10*3/uL (ref 3.6–11.0)

## 2017-08-29 LAB — COMPREHENSIVE METABOLIC PANEL
ALBUMIN: 4.3 g/dL (ref 3.5–5.0)
ALT: 16 U/L (ref 14–54)
ANION GAP: 8 (ref 5–15)
AST: 22 U/L (ref 15–41)
Alkaline Phosphatase: 41 U/L (ref 38–126)
BILIRUBIN TOTAL: 0.6 mg/dL (ref 0.3–1.2)
BUN: 14 mg/dL (ref 6–20)
CHLORIDE: 104 mmol/L (ref 101–111)
CO2: 24 mmol/L (ref 22–32)
Calcium: 9.7 mg/dL (ref 8.9–10.3)
Creatinine, Ser: 0.9 mg/dL (ref 0.44–1.00)
GFR calc Af Amer: >60 mL/min (ref >60)
GFR calc non Af Amer: >60 mL/min (ref >60)
GLUCOSE: 92 mg/dL (ref 65–99)
Potassium: 4.1 mmol/L (ref 3.5–5.1)
Sodium: 136 mmol/L (ref 135–145)
TOTAL PROTEIN: 6.9 g/dL (ref 6.5–8.1)

## 2017-08-29 NOTE — Progress Notes (Signed)
Pt and husband in for follow up.  States "doing well".  Denies any difficulties since last visit.  State has no concerns.

## 2017-08-29 NOTE — Progress Notes (Signed)
[facility] Follow Up Visit.   Patient Care Team: Rusty Aus, MD as PCP - General (Internal Medicine) Dalia Heading, CNM as Midwife (Certified Nurse Midwife) Bary Castilla, Forest Gleason, MD (General Surgery) Minna Merritts, MD as Consulting Physician (Cardiology)  REASON FOR VISIT Follow up for management of follicular lymphoma.    HISTORY OF PRESENTING ILLNESS: Carly Roberts 52 y.o. female who is referred by Dr.Oaks to [place] for evaluation and management of recently diagnosed follicular lymphoma. patientWas in her usual state of health until recently when she started to notice enlarging left neck mass she went to her primary care physician and ultrasound was done showing a few small lymph nodes. She continues to feel uncomfortableness in her neck and therefore a CT scan was performed. CT neck and chest with contrast on 12/08/2016 showed mild adenopathy in the left supraclavicular region and bilateral axillary/subpectoral small bilateral lower lobe nodules nonspecific. As followed by a PET scan on 12/10/2016 which showed pathologically enlarged Deauville 2 left supraclavicular and left subpectoral and axillary adenopathy in the chest. Pathologically enlarged nodes or retroperitoneal pelvic adenopathy in the abdomen. Normal size Deauville 2 scattered lymph nodes in the neck. Patient had ultrasound guided right inguinal node biopsy on 12/20/2016 and pathology showed follicular lymphoma, grade 1-2. KI 67 showed a low proliferation index 5%. Patient was referred to see me for discussion about treatment plan  # She was evaluate at [facility] by Dr.Grover who recommends repeating image scan. to get an idea of the pace of progression of her disease. She does not otherwise clearly meet GELF criteria except for mild symptoms related to neck lymphadenopathy as well as some mild anxiety related to deferring treatment, which is also a possible indication for treatment. If her disease burden  continues to be relatively low with slow disease pace, it is reasonable to continue watchful waiting vs discussion of single agent rituximab as a low toxicity alternative. We also discussed common side effects associated with rituximab. If her disease burden has increased, another option for treatment would be rituximab-bendamustine which likely would offer a longer remission than single agent rituximab but is also a more aggressive treatment   INTERVAL HISTORY Patient presents for follow up visit, accompanied by her  husband. She reports feeling that her cervical lymph nodes wax and wane, causing some tension of her posterior side of her neck. Also neck tightness, and tender..  Denies any drenching night sweats, profound fatigue. Appetite is good and no weight loss. She was offered to have a repeat PET scan done after her second opinion and patient declined.   Review of Systems  Constitutional: Negative for appetite change, chills, diaphoresis and fatigue.  HENT:   Negative for hearing loss, lump/mass, nosebleeds and sore throat.        Neck lymph nodes swelling, wax and wane.   Eyes: Negative for eye problems.  Respiratory: Negative for chest tightness, cough, hemoptysis and shortness of breath.   Cardiovascular: Negative for chest pain.  Gastrointestinal: Negative for abdominal distention, blood in stool and constipation.  Endocrine: Negative for hot flashes.  Genitourinary: Negative for bladder incontinence, difficulty urinating, frequency and hematuria.   Musculoskeletal: Negative for arthralgias, flank pain and myalgias.  Skin: Negative for itching and rash.  Neurological: Negative for dizziness, headaches and light-headedness.  Hematological: Positive for adenopathy.  Psychiatric/Behavioral: Negative for decreased concentration and depression. The patient is not nervous/anxious.     MEDICAL HISTORY: Past Medical History:  Diagnosis Date  . Anemia   .  Anxiety and depression   .  Cancer ([place]) 2025   follicular lymphoma  . Chicken pox   . Fibrocystic breast   . Heart murmur   . Hypertension   . Menorrhagia     SURGICAL HISTORY: Past Surgical History:  Procedure Laterality Date  . ABLATION  2008   endometrial ablation  . BREAST CYST ASPIRATION Bilateral 12/31/2015   FNA done by Dr. Bary Castilla  . BREAST EXCISIONAL BIOPSY Right 2005   Fibroadenoma, duct adenoma, right breast or o'clock.  . COLONOSCOPY  08/25/2011   removed 3 mm polyp Dr. Vira Agar  . DILATION AND CURETTAGE OF UTERUS    . HYSTEROSCOPY    . WISDOM TOOTH EXTRACTION      SOCIAL HISTORY: Social History   Socioeconomic History  . Marital status: Married    Spouse name: Not on file  . Number of children: 2  . Years of education: Not on file  . Highest education level: Not on file  Occupational History  . Occupation: Armed forces training and education officer  Social Needs  . Financial resource strain: Not on file  . Food insecurity:    Worry: Not on file    Inability: Not on file  . Transportation needs:    Medical: Not on file    Non-medical: Not on file  Tobacco Use  . Smoking status: Never Smoker  . Smokeless tobacco: Never Used  Substance and Sexual Activity  . Alcohol use: Yes    Alcohol/week: 0.0 oz    Comment: rare  . Drug use: No  . Sexual activity: Not Currently    Birth control/protection: Other-see comments    Comment: ablation  Lifestyle  . Physical activity:    Days per week: Not on file    Minutes per session: Not on file  . Stress: Not on file  Relationships  . Social connections:    Talks on phone: Not on file    Gets together: Not on file    Attends religious service: Not on file    Active member of club or organization: Not on file    Attends meetings of clubs or organizations: Not on file    Relationship status: Not on file  . Intimate partner violence:    Fear of current or ex partner: Not on file    Emotionally abused: Not on file    Physically abused: Not on file     Forced sexual activity: Not on file  Other Topics Concern  . Not on file  Social History Narrative  . Not on file    FAMILY HISTORY Family History  Problem Relation Age of Onset  . Multiple sclerosis Mother   . Hypothyroidism Mother   . Diverticulosis Mother   . Hyperlipidemia Father   . Heart disease Father        Had MI 2016  . Hypertension Father   . Skin cancer Father   . Lung cancer Maternal Aunt        40s  . Alcohol abuse Maternal Uncle   . Lung cancer Maternal Uncle        23s  . Stroke Maternal Grandmother   . Hypertension Maternal Grandmother   . Kidney disease Maternal Grandmother   . Diabetes Maternal Grandmother   . Hypertension Paternal Grandfather   . Colon cancer Paternal Grandfather   . Autoimmune disease Sister        alopecia   . Prostate cancer Maternal Grandfather   . Colon cancer Maternal Uncle  ALLERGIES:  is allergic to merthiolate [thimerosal]; sulfa antibiotics; and sulfasalazine.  MEDICATIONS:  Current Outpatient Medications  Medication Sig Dispense Refill  . b complex vitamins tablet Take 1 tablet by mouth daily.    Marland Kitchen BIOTIN PO Take by mouth.    Marland Kitchen ibuprofen (ADVIL,MOTRIN) 200 MG tablet Take 200 mg by mouth every 6 (six) hours as needed.    . Multiple Minerals-Vitamins (CALCIUM & VIT D3 BONE HEALTH PO) Take by mouth.    . Wheat Dextrin (BENEFIBER PO) Take by mouth.     No current facility-administered medications for this visit.     PHYSICAL EXAMINATION:  ECOG PERFORMANCE STATUS: 0 - Asymptomatic  Vitals:   08/29/17 1531  BP: 110/62  Pulse: (!) 48  Resp: 16  Temp: 98.1 F (36.7 C)    Filed Weights   08/29/17 1531 08/29/17 1536  Weight: 120 lb (54.4 kg) 120 lb (54.4 kg)     Physical Exam  Constitutional: She is oriented to person, place, and time and well-developed, well-nourished, and in no distress. No distress.  HENT:  Head: Normocephalic and atraumatic.  Eyes: Pupils are equal, round, and reactive to light.  Conjunctivae and EOM are normal. No scleral icterus.  Neck: Normal range of motion. Neck supple.   Small soft cervical lymph nodes bilateral, left supraclavicular node (+), appears more prominent.   Cardiovascular: Normal rate and regular rhythm.  No murmur heard. Pulmonary/Chest: Effort normal and breath sounds normal.  Abdominal: Soft. Bowel sounds are normal. She exhibits no distension.  Musculoskeletal: Normal range of motion. She exhibits no edema.  Neurological: She is alert and oriented to person, place, and time. No cranial nerve deficit.  Skin: Skin is warm and dry.  Psychiatric: Affect normal.    LABORATORY DATA: I have personally reviewed the data as listed: CBC    Component Value Date/Time   WBC 6.8 04/29/2017 1124   RBC 4.31 04/29/2017 1124   HGB 12.9 04/29/2017 1124   HCT 39.1 04/29/2017 1124   PLT 284 04/29/2017 1124   MCV 90.7 04/29/2017 1124   MCH 30.0 04/29/2017 1124   MCHC 33.1 04/29/2017 1124   RDW 13.3 04/29/2017 1124   LYMPHSABS 1.0 04/29/2017 1124   MONOABS 0.4 04/29/2017 1124   EOSABS 0.1 04/29/2017 1124   BASOSABS 0.1 04/29/2017 1124   CMP Latest Ref Rng & Units 04/29/2017 01/24/2017 12/07/2016  Glucose 65 - 99 mg/dL 105(H) 90 95  BUN 6 - 20 mg/dL '15 19 14  '$ Creatinine 0.44 - 1.00 mg/dL 0.94 1.01(H) 1.03  Sodium 135 - 145 mmol/L 135 134(L) 138  Potassium 3.5 - 5.1 mmol/L 4.1 4.0 3.8  Chloride 101 - 111 mmol/L 102 104 103  CO2 22 - 32 mmol/L '27 23 29  '$ Calcium 8.9 - 10.3 mg/dL 9.9 9.1 10.0  Total Protein 6.5 - 8.1 g/dL 7.1 6.9 -  Total Bilirubin 0.3 - 1.2 mg/dL 0.7 0.7 -  Alkaline Phos 38 - 126 U/L 41 46 -  AST 15 - 41 U/L 21 24 -  ALT 14 - 54 U/L 15 15 -  #  Negative hepatitis panel and HIV.   Surgical Pathology 12/20/2016 CASE: [id number]  PATIENT: Anaria LONG  Surgical Pathology Report DIAGNOSIS:  A. LYMPH NODE, RIGHT GROIN; NEEDLE CORE BIOPSY:  - FOLLICULAR LYMPHOMA, GRADE 1-2, SEE COMMENT.   Comment: A sample was sent for flow  cytometry ([employer]/[facility], Accession [id number]) with the following interpretation: CD10+ clonal B-cell population detected.  The case was sent to [facility] for consultation. The findings of the external consultant Dr. Angelena Form are incorporated above. This is the comment of the external consultant: Thank you for the opportunity to review this case. HE sections demonstrate small core needle biopsy tissue fragments with a dense lymphoid proliferation with a vaguely follicular architecture. The lymphocytes are composed of small mature-appearing forms. No significant population of large lymphocytes is detected. Immunohistochemical stains are performed and evaluated to further characterize this lymphoid proliferation. CD3 marks T cells and negatively highlights B-cell rich follicles. CD20 marks numerous B-cell rich follicles, as well as numerous interfollicular B cells. The B cells are positive for CD10, BCL 6 and BCL-2. CD5 marks T cells without overt coexpression in B cells, while a cyclin D1 immunostain is negative in lymphocytes. XH74 highlights follicular dendritic cell mesh works underlying most of the follicles. Ki-67 showed a low proliferation index (5%).  Per report, flow cytometric analysis reveals a CD10 positive clonal B-cell population. For details, see [place] report. The  morphologic and immunophenotypic findings support a diagnosis of follicular lymphoma, grade 1-2, with a follicular pattern in this small submitted sample. Selected slides from this case were reviewed and discussed at our hematopathology consensus conference on 12/23/16.      RADIOGRAPHIC STUDIES: I have personally reviewed the radiological images as listed and agree with the findings in the report CT Neck and chest with contrast 12/08/2016  IMPRESSION: Mild adenopathy in the left supraclavicular region and bilateral axillary/ subpectoral regions. Findings  concerning for possible lymphoproliferative disorder.Small bilateral 3 mm lower lobe pulmonary nodules, nonspecific.Recommend attention on follow-up imaging. PET scan 12/10/2016 IMPRESSION: 1. Pathologically enlarged Deauville 4 left supraclavicular and left subpectoral and axillary adenopathy in the chest. Pathologically enlarged total for retroperitoneal and pelvic adenopathy in the abdomen. 2. Upper normal sized Deauville 2 scattered lymph nodes in the neck. 3. Other imaging findings of potential clinical significance: Fibroid uterus. Suspected scarring of the left kidney.  ASSESSMENT/PLAN Cancer Staging Follicular lymphoma of lymph nodes of multiple regions [facility]) Staging form: Hodgkin and Non-Hodgkin Lymphoma, AJCC 8th Edition - Clinical stage from 1/42/3953: Stage III (Follicular lymphoma) - Signed by Earlie Server, MD on 12/27/2016  1. 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 Today's lab results were reviewed with patient.  No cytopenia, organomegaly or constitutional symptoms. Recommend PET scan to evaluate the pace of her lymphoma progression. If stable disease recommend no treatment.  Immunotherapy based therapy such as Rituximab can be considered as well.  If disease progression, can also consider BR.   The patient knows to call the clinic with any problems, questions or concerns. Thank you for this kind referral and the opportunity to participate in the care of this patient. A copy of today's note is routed to referring provider Dr.Oaks.     Earlie Server, MD, PhD Hematology Oncology [facility] at [facility] Pager- [phone] 08/29/2017

## 2017-09-05 ENCOUNTER — Ambulatory Visit: Payer: BC Managed Care – PPO | Admitting: Oncology

## 2017-09-05 ENCOUNTER — Ambulatory Visit
Admission: RE | Admit: 2017-09-05 | Discharge: 2017-09-05 | Disposition: A | Discharge status: Home / Self Care | Payer: BC Managed Care – PPO | Source: Ambulatory Visit | Attending: Oncology | Admitting: Oncology

## 2017-09-05 ENCOUNTER — Other Ambulatory Visit: Payer: BC Managed Care – PPO

## 2017-09-05 DIAGNOSIS — D259 Leiomyoma of uterus, unspecified: Secondary | ICD-10-CM | POA: Insufficient documentation

## 2017-09-05 DIAGNOSIS — C8290 Follicular lymphoma, unspecified, unspecified site: Secondary | ICD-10-CM | POA: Diagnosis present

## 2017-09-05 DIAGNOSIS — K5641 Fecal impaction: Secondary | ICD-10-CM | POA: Insufficient documentation

## 2017-09-05 DIAGNOSIS — N2889 Other specified disorders of kidney and ureter: Secondary | ICD-10-CM | POA: Insufficient documentation

## 2017-09-05 DIAGNOSIS — D71 Functional disorders of polymorphonuclear neutrophils: Secondary | ICD-10-CM | POA: Diagnosis not present

## 2017-09-05 DIAGNOSIS — R590 Localized enlarged lymph nodes: Secondary | ICD-10-CM | POA: Insufficient documentation

## 2017-09-05 LAB — GLUCOSE, CAPILLARY: Glucose-Capillary: 92 mg/dL (ref 65–99)

## 2017-09-05 MED ORDER — FLUDEOXYGLUCOSE F - 18 (FDG) INJECTION
6.5500 | Freq: Once | INTRAVENOUS | Status: AC | PRN
Start: 2017-09-05 — End: 2017-09-05
  Administered 2017-09-05: 6.55 via INTRAVENOUS

## 2017-09-15 ENCOUNTER — Encounter: Payer: Self-pay | Admitting: Oncology

## 2017-09-15 ENCOUNTER — Inpatient Hospital Stay: Payer: BC Managed Care – PPO | Attending: Oncology | Admitting: Oncology

## 2017-09-15 VITALS — BP 105/65 | HR 48 | Temp 97.7°F | Resp 16 | Wt 119.6 lb

## 2017-09-15 DIAGNOSIS — Z800 Family history of malignant neoplasm of digestive organs: Secondary | ICD-10-CM | POA: Diagnosis not present

## 2017-09-15 DIAGNOSIS — K5900 Constipation, unspecified: Secondary | ICD-10-CM | POA: Insufficient documentation

## 2017-09-15 DIAGNOSIS — C8298 Follicular lymphoma, unspecified, lymph nodes of multiple sites: Secondary | ICD-10-CM | POA: Diagnosis present

## 2017-09-15 DIAGNOSIS — I10 Essential (primary) hypertension: Secondary | ICD-10-CM | POA: Diagnosis not present

## 2017-09-15 DIAGNOSIS — Z79899 Other long term (current) drug therapy: Secondary | ICD-10-CM | POA: Diagnosis not present

## 2017-09-15 DIAGNOSIS — R011 Cardiac murmur, unspecified: Secondary | ICD-10-CM | POA: Diagnosis not present

## 2017-09-15 DIAGNOSIS — N920 Excessive and frequent menstruation with regular cycle: Secondary | ICD-10-CM | POA: Diagnosis not present

## 2017-09-15 DIAGNOSIS — F418 Other specified anxiety disorders: Secondary | ICD-10-CM | POA: Diagnosis not present

## 2017-09-15 DIAGNOSIS — R918 Other nonspecific abnormal finding of lung field: Secondary | ICD-10-CM | POA: Insufficient documentation

## 2017-09-15 DIAGNOSIS — R590 Localized enlarged lymph nodes: Secondary | ICD-10-CM

## 2017-09-15 NOTE — Progress Notes (Signed)
[place] [facility] Follow Up Visit.   Patient Care Team: Rusty Aus, MD as PCP - General (Internal Medicine) Dalia Heading, CNM as Midwife (Certified Nurse Midwife) Bary Castilla, Forest Gleason, MD (General Surgery) Minna Merritts, MD as Consulting Physician (Cardiology)  REASON FOR VISIT Follow up for management of follicular lymphoma.    HISTORY OF PRESENTING ILLNESS: Carly Roberts 52 y.o. female who is referred by Dr.Oaks to [place] for evaluation and management of recently diagnosed follicular lymphoma. patientWas in her usual state of health until recently when she started to notice enlarging left neck mass she went to her primary care physician and ultrasound was done showing a few small lymph nodes. She continues to feel uncomfortableness in her neck and therefore a CT scan was performed. CT neck and chest with contrast on 12/08/2016 showed mild adenopathy in the left supraclavicular region and bilateral axillary/subpectoral small bilateral lower lobe nodules nonspecific. As followed by a PET scan on 12/10/2016 which showed pathologically enlarged Deauville 2 left supraclavicular and left subpectoral and axillary adenopathy in the chest. Pathologically enlarged nodes or retroperitoneal pelvic adenopathy in the abdomen. Normal size Deauville 2 scattered lymph nodes in the neck. Patient had ultrasound guided right inguinal node biopsy on 12/20/2016 and pathology showed follicular lymphoma, grade 1-2. KI 67 showed a low proliferation index 5%. Patient was referred to see me for discussion about treatment plan  # She was evaluate at [facility] by Dr.Grover who recommends repeating image scan. to get an idea of the pace of progression of her disease. She does not otherwise clearly meet GELF criteria except for mild symptoms related to neck lymphadenopathy as well as some mild anxiety related to deferring treatment, which is also a possible indication for treatment. If her disease burden  continues to be relatively low with slow disease pace, it is reasonable to continue watchful waiting vs discussion of single agent rituximab as a low toxicity alternative. We also discussed common side effects associated with rituximab. If her disease burden has increased, another option for treatment would be rituximab-bendamustine which likely would offer a longer remission than single agent rituximab but is also a more aggressive treatment   INTERVAL HISTORY Patient presents for follow up visit, accompanied by her  husband. She has had PET scan done during the interval and presents to discuss results and further management plan.   She reports feeling that her cervical lymph nodes wax and wane, causing some tension of her posterior side of her neck. Also neck tightness, and tender..  Denies any drenching night sweats, profound fatigue. Appetite is good and no weight loss. She was offered to have a repeat PET scan done after her second opinion and patient declined.   Review of Systems  Constitutional: Negative for chills, fever, malaise/fatigue and weight loss.  HENT: Negative for congestion, ear discharge, ear pain, nosebleeds, sinus pain and sore throat.        Neck lymph nodes swelling, wax and wane.   Eyes: Negative for double vision, photophobia, pain, discharge and redness.  Respiratory: Negative for cough, hemoptysis, sputum production, shortness of breath and wheezing.   Cardiovascular: Negative for chest pain, palpitations, orthopnea, claudication and leg swelling.  Gastrointestinal: Negative for abdominal pain, blood in stool, constipation, diarrhea, heartburn, melena, nausea and vomiting.  Genitourinary: Negative for dysuria, flank pain, frequency and hematuria.  Musculoskeletal: Negative for back pain, myalgias and neck pain.  Skin: Negative for itching and rash.  Neurological: Negative for dizziness, tingling, tremors, focal weakness,  weakness and headaches.  Endo/Heme/Allergies:  Negative for environmental allergies. Does not bruise/bleed easily.  Psychiatric/Behavioral: Negative for depression, hallucinations, substance abuse and suicidal ideas. The patient is not nervous/anxious.     MEDICAL HISTORY: Past Medical History:  Diagnosis Date  . Anemia   . Anxiety and depression   . Cancer ([place]) 1194   follicular lymphoma  . Chicken pox   . Fibrocystic breast   . Heart murmur   . Hypertension   . Menorrhagia     SURGICAL HISTORY: Past Surgical History:  Procedure Laterality Date  . ABLATION  2008   endometrial ablation  . BREAST CYST ASPIRATION Bilateral 12/31/2015   FNA done by Dr. Bary Castilla  . BREAST EXCISIONAL BIOPSY Right 2005   Fibroadenoma, duct adenoma, right breast or o'clock.  . COLONOSCOPY  08/25/2011   removed 3 mm polyp Dr. Vira Agar  . DILATION AND CURETTAGE OF UTERUS    . HYSTEROSCOPY    . WISDOM TOOTH EXTRACTION      SOCIAL HISTORY: Social History   Socioeconomic History  . Marital status: Married    Spouse name: Not on file  . Number of children: 2  . Years of education: Not on file  . Highest education level: Not on file  Occupational History  . Occupation: Armed forces training and education officer  Social Needs  . Financial resource strain: Not on file  . Food insecurity:    Worry: Not on file    Inability: Not on file  . Transportation needs:    Medical: Not on file    Non-medical: Not on file  Tobacco Use  . Smoking status: Never Smoker  . Smokeless tobacco: Never Used  Substance and Sexual Activity  . Alcohol use: Yes    Alcohol/week: 0.0 oz    Comment: rare  . Drug use: No  . Sexual activity: Not Currently    Birth control/protection: Other-see comments    Comment: ablation  Lifestyle  . Physical activity:    Days per week: Not on file    Minutes per session: Not on file  . Stress: Not on file  Relationships  . Social connections:    Talks on phone: Not on file    Gets together: Not on file    Attends religious service:  Not on file    Active member of club or organization: Not on file    Attends meetings of clubs or organizations: Not on file    Relationship status: Not on file  . Intimate partner violence:    Fear of current or ex partner: Not on file    Emotionally abused: Not on file    Physically abused: Not on file    Forced sexual activity: Not on file  Other Topics Concern  . Not on file  Social History Narrative  . Not on file    FAMILY HISTORY Family History  Problem Relation Age of Onset  . Multiple sclerosis Mother   . Hypothyroidism Mother   . Diverticulosis Mother   . Hyperlipidemia Father   . Heart disease Father        Had MI 2016  . Hypertension Father   . Skin cancer Father   . Lung cancer Maternal Aunt        20s  . Alcohol abuse Maternal Uncle   . Lung cancer Maternal Uncle        61s  . Stroke Maternal Grandmother   . Hypertension Maternal Grandmother   . Kidney disease Maternal Grandmother   .  Diabetes Maternal Grandmother   . Hypertension Paternal Grandfather   . Colon cancer Paternal Grandfather   . Autoimmune disease Sister        alopecia   . Prostate cancer Maternal Grandfather   . Colon cancer Maternal Uncle     ALLERGIES:  is allergic to merthiolate [thimerosal]; sulfa antibiotics; and sulfasalazine.  MEDICATIONS:  Current Outpatient Medications  Medication Sig Dispense Refill  . b complex vitamins tablet Take 1 tablet by mouth daily.    Marland Kitchen BIOTIN PO Take by mouth.    . Cyanocobalamin 2500 MCG CHEW Chew by mouth.    Marland Kitchen ibuprofen (ADVIL,MOTRIN) 200 MG tablet Take 200 mg by mouth every 6 (six) hours as needed.    . Multiple Minerals-Vitamins (CALCIUM & VIT D3 BONE HEALTH PO) Take by mouth.    . Wheat Dextrin (BENEFIBER PO) Take by mouth.     No current facility-administered medications for this visit.     PHYSICAL EXAMINATION:  ECOG PERFORMANCE STATUS: 0 - Asymptomatic  Vitals:   09/15/17 1500  BP: 105/65  Pulse: (!) 48  Resp: 16  Temp: 97.7  F (36.5 C)    Filed Weights   09/15/17 1500  Weight: 119 lb 9.6 oz (54.3 kg)     Physical Exam  Constitutional: She is oriented to person, place, and time and well-developed, well-nourished, and in no distress. No distress.  HENT:  Head: Normocephalic and atraumatic.  Nose: Nose normal.  Mouth/Throat: Oropharynx is clear and moist. No oropharyngeal exudate.  Eyes: Pupils are equal, round, and reactive to light. Conjunctivae and EOM are normal. Left eye exhibits no discharge. No scleral icterus.  Neck: Normal range of motion. Neck supple. No JVD present.   Small soft cervical lymph nodes bilateral, left supraclavicular node (+), appears more prominent.   Cardiovascular: Normal rate, regular rhythm and normal heart sounds.  No murmur heard. Pulmonary/Chest: Effort normal and breath sounds normal. No respiratory distress. She has no wheezes. She has no rales. She exhibits no tenderness.  Abdominal: Soft. Bowel sounds are normal. She exhibits no distension and no mass. There is no tenderness. There is no rebound.  Musculoskeletal: Normal range of motion. She exhibits no edema or tenderness.  Lymphadenopathy:    She has no cervical adenopathy.  Neurological: She is alert and oriented to person, place, and time. No cranial nerve deficit. She exhibits normal muscle tone. Coordination normal.  Skin: Skin is warm and dry. No rash noted. She is not diaphoretic. No erythema.  Psychiatric: Affect and judgment normal.    LABORATORY DATA: I have personally reviewed the data as listed: CBC    Component Value Date/Time   WBC 6.0 08/29/2017 1420   RBC 4.15 08/29/2017 1420   HGB 12.8 08/29/2017 1420   HCT 36.8 08/29/2017 1420   PLT 265 08/29/2017 1420   MCV 88.8 08/29/2017 1420   MCH 30.8 08/29/2017 1420   MCHC 34.7 08/29/2017 1420   RDW 13.2 08/29/2017 1420   LYMPHSABS 1.0 08/29/2017 1420   MONOABS 0.5 08/29/2017 1420   EOSABS 0.1 08/29/2017 1420   BASOSABS 0.1 08/29/2017 1420   CMP  Latest Ref Rng & Units 08/29/2017 04/29/2017 01/24/2017  Glucose 65 - 99 mg/dL 92 105(H) 90  BUN 6 - 20 mg/dL '14 15 19  '$ Creatinine 0.44 - 1.00 mg/dL 0.90 0.94 1.01(H)  Sodium 135 - 145 mmol/L 136 135 134(L)  Potassium 3.5 - 5.1 mmol/L 4.1 4.1 4.0  Chloride 101 - 111 mmol/L 104 102 104  CO2  22 - 32 mmol/L '24 27 23  '$ Calcium 8.9 - 10.3 mg/dL 9.7 9.9 9.1  Total Protein 6.5 - 8.1 g/dL 6.9 7.1 6.9  Total Bilirubin 0.3 - 1.2 mg/dL 0.6 0.7 0.7  Alkaline Phos 38 - 126 U/L 41 41 46  AST 15 - 41 U/L '22 21 24  '$ ALT 14 - 54 U/L '16 15 15  '$ #  Negative hepatitis panel and HIV.   Surgical Pathology 12/20/2016 CASE: [id number]  PATIENT: Carizma LONG  Surgical Pathology Report DIAGNOSIS:  A. LYMPH NODE, RIGHT GROIN; NEEDLE CORE BIOPSY:  - FOLLICULAR LYMPHOMA, GRADE 1-2, SEE COMMENT.   Comment: A sample was sent for flow cytometry ([employer], Accession [id number]) with the following interpretation: CD10+ clonal B-cell population detected.  The case was sent to [facility] for consultation. The findings of the external consultant Dr. Angelena Form are incorporated above. This is the comment of the external consultant: Thank you for the opportunity to review this case. HE sections demonstrate small core needle biopsy tissue fragments with a dense lymphoid proliferation with a vaguely follicular architecture. The lymphocytes are composed of small mature-appearing forms. No significant population of large lymphocytes is detected. Immunohistochemical stains are performed and evaluated to further characterize this lymphoid proliferation. CD3 marks T cells and negatively highlights B-cell rich follicles. CD20 marks numerous B-cell rich follicles, as well as numerous interfollicular B cells. The B cells are positive for CD10, BCL 6 and BCL-2. CD5 marks T cells without overt coexpression in B cells, while a cyclin D1 immunostain is negative in lymphocytes. HQ46  highlights follicular dendritic cell mesh works underlying most of the follicles. Ki-67 showed a low proliferation index (5%).  Per report, flow cytometric analysis reveals a CD10 positive clonal B-cell population. For details, see [facility] report. The  morphologic and immunophenotypic findings support a diagnosis of follicular lymphoma, grade 1-2, with a follicular pattern in this small submitted sample. Selected slides from this case were reviewed and discussed at our hematopathology consensus conference on 12/23/16.      RADIOGRAPHIC STUDIES: I have personally reviewed the radiological images as listed and agree with the findings in the report CT Neck and chest with contrast 12/08/2016  IMPRESSION: Mild adenopathy in the left supraclavicular region and bilateral axillary/ subpectoral regions. Findings concerning for possible lymphoproliferative disorder.Small bilateral 3 mm lower lobe pulmonary nodules, nonspecific.Recommend attention on follow-up imaging. PET scan 12/10/2016 IMPRESSION: 1. Pathologically enlarged Deauville 4 left supraclavicular and left subpectoral and axillary adenopathy in the chest. Pathologically enlarged total for retroperitoneal and pelvic adenopathy in the abdomen. 2. Upper normal sized Deauville 2 scattered lymph nodes in the neck. 3. Other imaging findings of potential clinical significance: Fibroid uterus. Suspected scarring of the left kidney.   PET 09/05/2017 IMPRESSION: 1. Adenopathy in the neck, chest, and abdomen/pelvis is minimally worsened in size and activity compared to the prior exam, currently primarily Deauville 4 disease. 2. Other imaging findings of potential clinical significance: Old granulomatous disease. Prominent stool throughout the colon favors constipation. Left kidney upper pole scarring. Uterine fibroids.    ASSESSMENT/PLAN Cancer Staging Follicular lymphoma of lymph nodes of multiple regions [facility]) Staging form: Hodgkin  and Non-Hodgkin Lymphoma, AJCC 8th Edition - Clinical stage from 9/62/9528: Stage III (Follicular lymphoma) - Signed by Earlie Server, MD on 12/27/2016  1. 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 Cervical lymphadenopathy    PET scan was independently reviewed and discussed in details with patient and  her husband and daughter.  9 months interval showed minimal worsening of her lymphadenopathy.  Currently she has no cytopenia, organomegaly, or constitutional symptoms. Recommend watchful waiting.  If she fees too anxious about getting no treatment, single agent Rituximab can be an options.  In the future, when disease progress, can use BR.  Per patient's request, I have also communicated with [facility] Dr.Grover about patient's PET. Dr.Grover agrees with above plan.  The patient knows to call the clinic with any problems, questions or concerns. Thank you for this kind referral and the opportunity to participate in the care of this patient. A copy of today's note is routed to referring provider Dr.Oaks.     Follow up in 6 months with cbc cmp and ldh.  Earlie Server, MD, PhD Hematology Oncology [facility] at [facility] Pager- [phone] 09/15/2017

## 2017-10-13 ENCOUNTER — Encounter: Payer: Self-pay | Admitting: Oncology

## 2017-10-13 DIAGNOSIS — Z7189 Other specified counseling: Secondary | ICD-10-CM | POA: Insufficient documentation

## 2017-11-25 ENCOUNTER — Other Ambulatory Visit: Payer: Self-pay | Admitting: Internal Medicine

## 2017-11-25 DIAGNOSIS — Z1231 Encounter for screening mammogram for malignant neoplasm of breast: Secondary | ICD-10-CM

## 2017-12-16 ENCOUNTER — Ambulatory Visit (INDEPENDENT_AMBULATORY_CARE_PROVIDER_SITE_OTHER): Payer: BC Managed Care – PPO | Admitting: Certified Nurse Midwife

## 2017-12-16 ENCOUNTER — Other Ambulatory Visit (HOSPITAL_COMMUNITY)
Admission: RE | Admit: 2017-12-16 | Discharge: 2017-12-16 | Disposition: A | Discharge status: Home / Self Care | Payer: BC Managed Care – PPO | Source: Ambulatory Visit | Attending: Certified Nurse Midwife | Admitting: Certified Nurse Midwife

## 2017-12-16 ENCOUNTER — Encounter: Payer: Self-pay | Admitting: Certified Nurse Midwife

## 2017-12-16 VITALS — BP 110/90 | HR 48 | Ht 62.0 in | Wt 118.8 lb

## 2017-12-16 DIAGNOSIS — Z01419 Encounter for gynecological examination (general) (routine) without abnormal findings: Secondary | ICD-10-CM

## 2017-12-16 DIAGNOSIS — Z124 Encounter for screening for malignant neoplasm of cervix: Secondary | ICD-10-CM

## 2017-12-16 DIAGNOSIS — Z1151 Encounter for screening for human papillomavirus (HPV): Secondary | ICD-10-CM | POA: Diagnosis not present

## 2017-12-16 DIAGNOSIS — Z01411 Encounter for gynecological examination (general) (routine) with abnormal findings: Secondary | ICD-10-CM

## 2017-12-16 DIAGNOSIS — N949 Unspecified condition associated with female genital organs and menstrual cycle: Secondary | ICD-10-CM | POA: Diagnosis not present

## 2017-12-16 DIAGNOSIS — D259 Leiomyoma of uterus, unspecified: Secondary | ICD-10-CM | POA: Diagnosis not present

## 2017-12-16 NOTE — Progress Notes (Signed)
Gynecology Annual Exam  PCP: Rusty Aus, MD  Chief Complaint:  Chief Complaint  Patient presents with  . Gynecologic Exam    PET scan 4/19 - uterine fibroids; changes in BMs    History of Present Illness:Carly Roberts (she married last year) is a 52 year old Caucasian/White female , G 2 P 2 0 0 2 , who presents for her annual exam . She is having no significant GYN problems. Her menses are absent due to an endometrial ablation in 2008. She has had no spotting. She has had no hot flashes.   Since her last annual GYN exam dated 11/03/2016 , she was diagnosed with Non Hodgkin's follicular lymphoma. Her condition is being monitored by her oncologist. She is not receiving any chemotherapy for her lymphoma. She recently had a PET scan that showed little change in her condition. She has a history of fibroids and a fibroid uterus was seen on the PET scan. She reports a change in bowel habits more recently. History of chronic constipation, but started having loose stools 3-4 times a day about 5 weeks ago. This was accompanied by indigestion. She was begun on Protonix with relief of the indigestion and her stools are still loose but occurring 1-2 times a day. The patient's past medical history is also remarkable for fibrocystic changes in her breasts.  She had a palpable breast lump in her left breast at 4 o'clock at her last her last annual that was a breast cyst. She has felt breast masses in both breasts that wax and wane since her last exam. Had a breast aspiration of cysts in both breasts  by Dr Bary Castilla 12/2015 as they were causing pain.  She is sexually active. Current contraception is vasectomy Her most recent pap smear was obtained 11/03/2016 and was NIL. Her most recent mammogram/ ultrasound 01/03/2017 evealed multiple small cysts bilaterally and was Birads 2.   There is no family history of breast cancer.  There is no family history of ovarian cancer.  The patient does do  monthly  self breast exams.  The patient does not smoke.  The patient drinks alcohol rarely. The patient does not use illegal drugs.  The patient exercises occasionally and has been working remodeling her house. The patient does get adequate calcium in her diet.  She had a recent cholesterol screen in 2017 that was borderline   The patient denies current symptoms of depression. She was on Zoloft for a short time last year, but was weaned off.   Review of Systems: Review of Systems  Constitutional: Negative for chills, fever and weight loss.  HENT: Negative for congestion, sinus pain and sore throat.   Eyes: Negative for blurred vision and pain.       Positive for eye irritation  Respiratory: Negative for hemoptysis, shortness of breath and wheezing.   Cardiovascular: Negative for chest pain, palpitations and leg swelling.  Gastrointestinal: Positive for constipation and diarrhea. Negative for abdominal pain, blood in stool, heartburn, nausea and vomiting.  Genitourinary: Negative for dysuria, frequency, hematuria and urgency.  Musculoskeletal: Positive for joint pain (in hands). Negative for back pain and myalgias.  Skin: Negative for itching and rash.  Neurological: Negative for dizziness, tingling and headaches.  Endo/Heme/Allergies: Negative for environmental allergies and polydipsia. Does not bruise/bleed easily.       Negative for hirsutism.   Psychiatric/Behavioral: Negative for depression. The patient is not nervous/anxious and does not have insomnia.     Past  Medical History:  Past Medical History:  Diagnosis Date  . Anemia   . Anxiety and depression   . Cancer ([place]) 2018   Non Hodgkin's follicular lymphoma  . Chicken pox   . Fibrocystic breast 2011  . Heart murmur   . History of mammogram 05/27/2015   [id number]  . Hypertension   . Menorrhagia     Past Surgical History:  Past Surgical History:  Procedure Laterality Date  . ABLATION  2008   endometrial ablation  . BREAST  CYST ASPIRATION Bilateral 12/31/2015   FNA done by Dr. Bary Castilla  . BREAST EXCISIONAL BIOPSY Right 2005   Fibroadenoma, duct adenoma, right breast or o'clock.  . COLONOSCOPY  08/25/2011   removed 3 mm polyp Dr. Vira Agar  . DILATION AND CURETTAGE OF UTERUS    . HYSTEROSCOPY    . LYMPH NODE BIOPSY  2018  . WISDOM TOOTH EXTRACTION      Family History:  Family History  Problem Relation Age of Onset  . Multiple sclerosis Mother   . Hypothyroidism Mother   . Diverticulosis Mother        intestinal obstruction  . Hyperlipidemia Father   . Heart disease Father        Had MI 2016  . Hypertension Father   . Skin cancer Father   . Lung cancer Maternal Aunt        51s  . Alcohol abuse Maternal Uncle   . Lung cancer Maternal Uncle        59s  . Cancer Maternal Uncle 60       kidney  . Stroke Maternal Grandmother   . Hypertension Maternal Grandmother   . Kidney disease Maternal Grandmother        had a kidney removed  . Diabetes Maternal Grandmother   . Hypertension Paternal Grandfather   . Colon cancer Paternal Grandfather   . Autoimmune disease Sister        alopecia   . Prostate cancer Maternal Grandfather   . Colon cancer Maternal Uncle     Social History:  Social History   Socioeconomic History  . Marital status: Married    Spouse name: Not on file  . Number of children: 2  . Years of education: 70  . Highest education level: Not on file  Occupational History  . Occupation: Armed forces training and education officer  Social Needs  . Financial resource strain: Not on file  . Food insecurity:    Worry: Not on file    Inability: Not on file  . Transportation needs:    Medical: Not on file    Non-medical: Not on file  Tobacco Use  . Smoking status: Never Smoker  . Smokeless tobacco: Never Used  Substance and Sexual Activity  . Alcohol use: Yes    Alcohol/week: 0.0 oz    Comment: rare  . Drug use: No  . Sexual activity: Yes    Partners: Male    Comment: vasectomy  Lifestyle  .  Physical activity:    Days per week: Not on file    Minutes per session: Not on file  . Stress: Not on file  Relationships  . Social connections:    Talks on phone: Not on file    Gets together: Not on file    Attends religious service: Not on file    Active member of club or organization: Not on file    Attends meetings of clubs or organizations: Not on file    Relationship status: Not on  file  . Intimate partner violence:    Fear of current or ex partner: Not on file    Emotionally abused: Not on file    Physically abused: Not on file    Forced sexual activity: Not on file  Other Topics Concern  . Not on file  Social History Narrative  . Not on file    Allergies:  Allergies  Allergen Reactions  . Merthiolate [Thimerosal] Rash  . Sulfa Antibiotics Rash  . Sulfasalazine Rash    Medications: Current Outpatient Medications on File Prior to Visit  Medication Sig Dispense Refill  . BIOTIN PO Take by mouth.    Marland Kitchen ibuprofen (ADVIL,MOTRIN) 200 MG tablet Take 200 mg by mouth every 6 (six) hours as needed.    . Multiple Minerals-Vitamins (CALCIUM & VIT D3 BONE HEALTH PO) Take by mouth.    Marland Kitchen OVER THE COUNTER MEDICATION Take 1 tablet by mouth daily. tumeric    . pantoprazole (PROTONIX) 40 MG tablet Take 1 tablet by mouth daily.    . vitamin C (ASCORBIC ACID) 500 MG tablet Take 1 tablet by mouth daily.     No current facility-administered medications on file prior to visit.    Physical Exam Vitals: BP 110/90   Pulse (!) 48   Ht 5\' 2"  (1.575 m)   Wt 118 lb 12 oz (53.9 kg)   LMP  (LMP Unknown)   BMI 21.72 kg/m   General: petite WF in NAD HEENT: normocephalic, anicteric Neck: no thyroid enlargement, no palpable nodules, shoddy cervical lymphadenopathy. Nodule on left trapezius Pulmonary: No increased work of breathing, CTAB Cardiovascular: RRR, without murmur  Breast: Breasts soft, no tenderness,  no skin or nipple retraction present, no nipple discharge. No masses in left  breast. There is a 1 cm round mobile mass in the right breast at 4 0'clock.  No axillary, infraclavicular or supraclavicular lymphadenopathy.  Abdomen: Soft, non-tender, non-distended.  Umbilicus without lesions.  No hepatomegaly or masses palpable. No evidence of hernia. Genitourinary:  External: Normal external female genitalia.  Normal urethral meatus, normal Bartholin's and Skene's glands.    Vagina: Normal vaginal mucosa, no evidence of prolapse.    Cervix: Grossly normal in appearance, no bleeding, non-tender, deviated to the left  Uterus: Retroflexed, normal size with irregularity on the right fundal area (hx of fibroid on 2012 ultrasound), mobile, and non-tender  Adnexa: fullness in right adnexal area, ?fibroid, NT. No masses or tenderness on the left  Rectal: deferred  Lymphatic: no evidence of inguinal lymphadenopathy Extremities: no edema, erythema, or tenderness Neurologic: Grossly intact Psychiatric: mood appropriate, affect full    Assessment: 52 y.o. G2P2 well woman exam Right breast mass and history of bilateral breast cysts Possible change in size of fibroid. R/O adnexal masses   Plan:   1) Breast cancer screening - 3D mammogram already scheduled for this month  2) Cervical cancer screening - Pap was done. ASCCP guidelines and rational discussed.  Patient opts for yearly screening interval  3) Contraception -vasectomy  4) Routine healthcare maintenance including cholesterol and diabetes screening managed by PCP  5) Colon cancer screening- due in 2023? Last colonoscopy 2013. To ask Oncologist  6) Pelvic ultrasound ordered. FU after ultrasound  Dalia Heading, CNM

## 2017-12-20 LAB — CYTOLOGY - PAP
Diagnosis: NEGATIVE
HPV: NOT DETECTED

## 2018-01-04 ENCOUNTER — Ambulatory Visit
Admission: RE | Admit: 2018-01-04 | Discharge: 2018-01-04 | Disposition: A | Discharge status: Home / Self Care | Payer: BC Managed Care – PPO | Source: Ambulatory Visit | Attending: Internal Medicine | Admitting: Internal Medicine

## 2018-01-04 DIAGNOSIS — Z1231 Encounter for screening mammogram for malignant neoplasm of breast: Secondary | ICD-10-CM | POA: Diagnosis present

## 2018-01-17 ENCOUNTER — Ambulatory Visit (INDEPENDENT_AMBULATORY_CARE_PROVIDER_SITE_OTHER): Payer: BC Managed Care – PPO

## 2018-01-17 ENCOUNTER — Encounter: Payer: Self-pay | Admitting: Certified Nurse Midwife

## 2018-01-17 ENCOUNTER — Other Ambulatory Visit: Payer: Self-pay | Admitting: Certified Nurse Midwife

## 2018-01-17 ENCOUNTER — Ambulatory Visit (INDEPENDENT_AMBULATORY_CARE_PROVIDER_SITE_OTHER): Payer: BC Managed Care – PPO | Admitting: Certified Nurse Midwife

## 2018-01-17 DIAGNOSIS — D259 Leiomyoma of uterus, unspecified: Secondary | ICD-10-CM | POA: Insufficient documentation

## 2018-01-17 DIAGNOSIS — D251 Intramural leiomyoma of uterus: Secondary | ICD-10-CM

## 2018-01-17 DIAGNOSIS — R102 Pelvic and perineal pain: Secondary | ICD-10-CM

## 2018-01-17 DIAGNOSIS — D252 Subserosal leiomyoma of uterus: Secondary | ICD-10-CM

## 2018-01-17 NOTE — Progress Notes (Signed)
  [id number] Carly Roberts ( I.e Carly Roberts, she married last year) is a 52 year old Caucasian/White female , G 2 P 2 0 0 2 , who presents for a follow up after having a pelvic ultrasound. 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 She recently had a PET scan that showed little change in her condition. She has a history of fibroids and a fibroid uterus was seen on the PET scan. Last prior ultrasound in 2012 showed a 14 mm fibroid. She has a history of having an endometrial ablation and has not had any vaginal bleeding. Has had a chronic intermittent history of burning in her RLQ, but no pelvic pressure type symptoms like urinary frequency.   Ultrasound today demonstrates a 5.65 x 8.01 fibroid on the right lateral uterus. Unable to see the right ovary due to the large fibroid. EM stripe 1.75mm.    PMHx: She  has a past medical history of Anemia, Anxiety and depression, Cancer ([place]) (2018), Chicken pox, Fibrocystic breast (2011), Heart murmur, History of mammogram (05/27/2015), Hypertension, and Menorrhagia. Also,  has a past surgical history that includes Ablation (2008); Colonoscopy (08/25/2011); Dilation and curettage of uterus; Wisdom tooth extraction; Hysteroscopy; Lymph node biopsy (2018); Breast excisional biopsy (Right, 2005); and Breast cyst aspiration (Bilateral, 12/31/2015)., family history includes Alcohol abuse in her maternal uncle; Autoimmune disease in her sister; Cancer (age of onset: 55) in her maternal uncle; Colon cancer in her maternal uncle and paternal grandfather; Diabetes in her maternal grandmother; Diverticulosis in her mother; Heart disease in her father; Hyperlipidemia in her father; Hypertension in her father, maternal grandmother, and paternal grandfather; Hypothyroidism in her mother; Kidney disease in her maternal grandmother; Lung cancer in her maternal aunt and maternal uncle; Multiple sclerosis in her mother; Prostate cancer in her maternal grandfather;  Skin cancer in her father; Stroke in her maternal grandmother.,  reports that she has never smoked. She has never used smokeless tobacco. She reports that she drinks alcohol. She reports that she does not use drugs.  She has a current medication list which includes the following prescription(s): biotin, vitamin d3, cyanocobalamin, ibuprofen, multiple minerals-vitamins, NON FORMULARY, OVER THE COUNTER MEDICATION, OVER THE COUNTER MEDICATION, pantoprazole, and vitamin c. Also, is allergic to merthiolate [thimerosal]; sulfa antibiotics; and sulfasalazine.  ROS  Objective: BP 120/80   Pulse (!) 54   Ht 5\' 2"  (1.575 m)   Wt 121 lb (54.9 kg)   BMI 22.13 kg/m   Physical examination Constitutional NAD, Conversant  Skin No rashes, lesions or ulceration.   Extremities: Moves all appropriately.  Normal ROM for age.  Neuro: Grossly intact  Psych: Oriented to PPT.  Normal mood. Normal affect.   Assessment:  Single large fibroid on right lateral uterus measuring 5.65 x 8.01 cm (has grown since 2012)-asymptomatic  Plan: Discussed case with Dr Georgianne Fick. Will monitor growth of the fibroid with annual ultrasound with annual exams. Reviewed symptoms of enlarging fibroids. Explained that the fibroids usually shrink with menopause. Treatment of symptomatic fibroids is usually excision of the fibroid or hysterectomy.  Dalia Heading, CNM  Dalia Heading, [place]

## 2018-02-13 ENCOUNTER — Encounter: Payer: Self-pay | Admitting: Oncology

## 2018-02-13 ENCOUNTER — Encounter: Payer: Self-pay | Admitting: *Deleted

## 2018-02-20 ENCOUNTER — Inpatient Hospital Stay: Payer: BC Managed Care – PPO | Attending: Oncology

## 2018-02-20 ENCOUNTER — Other Ambulatory Visit: Payer: Self-pay

## 2018-02-20 ENCOUNTER — Inpatient Hospital Stay (HOSPITAL_BASED_OUTPATIENT_CLINIC_OR_DEPARTMENT_OTHER): Payer: BC Managed Care – PPO | Admitting: Oncology

## 2018-02-20 ENCOUNTER — Encounter: Payer: Self-pay | Admitting: Oncology

## 2018-02-20 VITALS — BP 112/67 | HR 51 | Temp 97.3°F | Resp 18 | Wt 120.5 lb

## 2018-02-20 DIAGNOSIS — R011 Cardiac murmur, unspecified: Secondary | ICD-10-CM | POA: Diagnosis not present

## 2018-02-20 DIAGNOSIS — Z801 Family history of malignant neoplasm of trachea, bronchus and lung: Secondary | ICD-10-CM | POA: Diagnosis not present

## 2018-02-20 DIAGNOSIS — K5900 Constipation, unspecified: Secondary | ICD-10-CM | POA: Diagnosis not present

## 2018-02-20 DIAGNOSIS — N6011 Diffuse cystic mastopathy of right breast: Secondary | ICD-10-CM | POA: Insufficient documentation

## 2018-02-20 DIAGNOSIS — Z808 Family history of malignant neoplasm of other organs or systems: Secondary | ICD-10-CM | POA: Diagnosis not present

## 2018-02-20 DIAGNOSIS — R197 Diarrhea, unspecified: Secondary | ICD-10-CM

## 2018-02-20 DIAGNOSIS — K529 Noninfective gastroenteritis and colitis, unspecified: Secondary | ICD-10-CM

## 2018-02-20 DIAGNOSIS — R918 Other nonspecific abnormal finding of lung field: Secondary | ICD-10-CM | POA: Diagnosis not present

## 2018-02-20 DIAGNOSIS — N6012 Diffuse cystic mastopathy of left breast: Secondary | ICD-10-CM | POA: Insufficient documentation

## 2018-02-20 DIAGNOSIS — I10 Essential (primary) hypertension: Secondary | ICD-10-CM | POA: Diagnosis not present

## 2018-02-20 DIAGNOSIS — D259 Leiomyoma of uterus, unspecified: Secondary | ICD-10-CM | POA: Insufficient documentation

## 2018-02-20 DIAGNOSIS — D649 Anemia, unspecified: Secondary | ICD-10-CM

## 2018-02-20 DIAGNOSIS — C8298 Follicular lymphoma, unspecified, lymph nodes of multiple sites: Secondary | ICD-10-CM | POA: Diagnosis not present

## 2018-02-20 DIAGNOSIS — C8290 Follicular lymphoma, unspecified, unspecified site: Secondary | ICD-10-CM

## 2018-02-20 DIAGNOSIS — Z79899 Other long term (current) drug therapy: Secondary | ICD-10-CM | POA: Diagnosis not present

## 2018-02-20 DIAGNOSIS — Z8042 Family history of malignant neoplasm of prostate: Secondary | ICD-10-CM | POA: Diagnosis not present

## 2018-02-20 DIAGNOSIS — R590 Localized enlarged lymph nodes: Secondary | ICD-10-CM | POA: Insufficient documentation

## 2018-02-20 DIAGNOSIS — F418 Other specified anxiety disorders: Secondary | ICD-10-CM | POA: Diagnosis not present

## 2018-02-20 LAB — CBC WITH DIFFERENTIAL/PLATELET
BASOS ABS: 0 10*3/uL (ref 0–0.1)
BASOS PCT: 1 %
EOS ABS: 0.1 10*3/uL (ref 0–0.7)
Eosinophils Relative: 1 %
HCT: 37.1 % (ref 35.0–47.0)
HEMOGLOBIN: 12.7 g/dL (ref 12.0–16.0)
LYMPHS ABS: 1 10*3/uL (ref 1.0–3.6)
Lymphocytes Relative: 14 %
MCH: 30.6 pg (ref 26.0–34.0)
MCHC: 34.3 g/dL (ref 32.0–36.0)
MCV: 89.3 fL (ref 80.0–100.0)
Monocytes Absolute: 0.4 10*3/uL (ref 0.2–0.9)
Monocytes Relative: 6 %
NEUTROS PCT: 78 %
Neutro Abs: 5.3 10*3/uL (ref 1.4–6.5)
Platelets: 275 10*3/uL (ref 150–440)
RBC: 4.15 MIL/uL (ref 3.80–5.20)
RDW: 12.9 % (ref 11.5–14.5)
WBC: 6.8 10*3/uL (ref 3.6–11.0)

## 2018-02-20 LAB — COMPREHENSIVE METABOLIC PANEL
ALT: 13 U/L (ref 0–44)
AST: 19 U/L (ref 15–41)
Albumin: 4.1 g/dL (ref 3.5–5.0)
Alkaline Phosphatase: 46 U/L (ref 38–126)
Anion gap: 7 (ref 5–15)
BUN: 13 mg/dL (ref 6–20)
CO2: 26 mmol/L (ref 22–32)
CREATININE: 1.02 mg/dL — AB (ref 0.44–1.00)
Calcium: 9.5 mg/dL (ref 8.9–10.3)
Chloride: 104 mmol/L (ref 98–111)
Glucose, Bld: 100 mg/dL — ABNORMAL HIGH (ref 70–99)
Potassium: 4 mmol/L (ref 3.5–5.1)
Sodium: 137 mmol/L (ref 135–145)
TOTAL PROTEIN: 6.9 g/dL (ref 6.5–8.1)
Total Bilirubin: 0.9 mg/dL (ref 0.3–1.2)

## 2018-02-20 LAB — LACTATE DEHYDROGENASE: LDH: 121 U/L (ref 98–192)

## 2018-02-20 NOTE — Progress Notes (Signed)
Patient here for follow up. Has been having probems with bowels (loose stools) since end of June.

## 2018-02-20 NOTE — Progress Notes (Signed)
[facility] Follow Up Visit.   Patient Care Team: Rusty Aus, MD as PCP - General (Internal Medicine) Dalia Heading, CNM as Midwife (Certified Nurse Midwife) Bary Castilla, Forest Gleason, MD (General Surgery) Minna Merritts, MD as Consulting Physician (Cardiology)  REASON FOR VISIT Follow up for management of follicular lymphoma.    HISTORY OF PRESENTING ILLNESS: Carly Roberts 52 y.o. female who is referred by Dr.Oaks to [place] for evaluation and management of recently diagnosed follicular lymphoma. patientWas in her usual state of health until recently when she started to notice enlarging left neck mass she went to her primary care physician and ultrasound was done showing a few small lymph nodes. She continues to feel uncomfortableness in her neck and therefore a CT scan was performed. CT neck and chest with contrast on 12/08/2016 showed mild adenopathy in the left supraclavicular region and bilateral axillary/subpectoral small bilateral lower lobe nodules nonspecific. As followed by a PET scan on 12/10/2016 which showed pathologically enlarged Deauville 2 left supraclavicular and left subpectoral and axillary adenopathy in the chest. Pathologically enlarged nodes or retroperitoneal pelvic adenopathy in the abdomen. Normal size Deauville 2 scattered lymph nodes in the neck. Patient had ultrasound guided right inguinal node biopsy on 12/20/2016 and pathology showed follicular lymphoma, grade 1-2. KI 67 showed a low proliferation index 5%. Patient was referred to see me for discussion about treatment plan  # She was evaluate at [facility] by Dr.Grover who recommends repeating image scan. to get an idea of the pace of progression of her disease. She does not otherwise clearly meet GELF criteria except for mild symptoms related to neck lymphadenopathy as well as some mild anxiety related to deferring treatment, which is also a possible indication for treatment. If her disease burden  continues to be relatively low with slow disease pace, it is reasonable to continue watchful waiting vs discussion of single agent rituximab as a low toxicity alternative. We also discussed common side effects associated with rituximab. If her disease burden has increased, another option for treatment would be rituximab-bendamustine which likely would offer a longer remission than single agent rituximab but is also a more aggressive treatment   INTERVAL HISTORY Patient with history listed above reviewed by me present for follow-up of management of follicular cell lymphoma. Accompanied by patient's husband. Patient reports feeling at baseline.  Her neck lymph nodes wax and wanes.  No weight loss.  Chronic fatigue level has not changed. She was supposed to see me in November.  She reports to primary care physician about gastroenterology symptoms and was recommended to contact cancer center for an early visit. Patient tells me that since June this year, she has had gastroenterology discomforts, initially she thought this was due to a stomach flu.  She has stomach discomfort, multiple loose stool episodes.  She takes Protonix which improved her stomach discomfort.  Continue to have chronic loose stool episodes, on some days. Denies any abdominal pain. Also reports an area on her lower back on the right side, described as achiness.  She thought was due to urinary tract infection and she had a urine checked which was negative.    Review of Systems  Constitutional: Negative for chills, fever, malaise/fatigue and weight loss.  HENT: Negative for congestion, ear discharge, ear pain, nosebleeds, sinus pain and sore throat.        Neck lymph nodes swelling, wax and wane.   Eyes: Negative for double vision, photophobia, pain, discharge and redness.  Respiratory: Negative  for cough, hemoptysis, sputum production, shortness of breath and wheezing.   Cardiovascular: Negative for chest pain, palpitations,  orthopnea, claudication and leg swelling.  Gastrointestinal: Positive for diarrhea. Negative for abdominal pain, blood in stool, constipation, heartburn, melena, nausea and vomiting.  Genitourinary: Negative for dysuria, flank pain, frequency and hematuria.  Musculoskeletal: Negative for back pain, myalgias and neck pain.  Skin: Negative for itching and rash.  Neurological: Negative for dizziness, tingling, tremors, focal weakness, weakness and headaches.  Endo/Heme/Allergies: Negative for environmental allergies. Does not bruise/bleed easily.  Psychiatric/Behavioral: Negative for depression, hallucinations, substance abuse and suicidal ideas. The patient is not nervous/anxious.     MEDICAL HISTORY: Past Medical History:  Diagnosis Date  . Anemia   . Anxiety and depression   . Cancer ([place]) 2018   Non Hodgkin's follicular lymphoma  . Chicken pox   . Fibrocystic breast 2011  . Heart murmur   . History of mammogram 05/27/2015   [id number]  . Hypertension   . Menorrhagia     SURGICAL HISTORY: Past Surgical History:  Procedure Laterality Date  . ABLATION  2008   endometrial ablation  . BREAST CYST ASPIRATION Bilateral 12/31/2015   FNA done by Dr. Bary Castilla  . BREAST EXCISIONAL BIOPSY Right 2005   Fibroadenoma, duct adenoma, right breast or o'clock.  . COLONOSCOPY  08/25/2011   removed 3 mm polyp Dr. Vira Agar  . DILATION AND CURETTAGE OF UTERUS    . HYSTEROSCOPY    . LYMPH NODE BIOPSY  2018  . WISDOM TOOTH EXTRACTION      SOCIAL HISTORY: Social History   Socioeconomic History  . Marital status: Married    Spouse name: Not on file  . Number of children: 2  . Years of education: 51  . Highest education level: Not on file  Occupational History  . Occupation: Armed forces training and education officer  Social Needs  . Financial resource strain: Not on file  . Food insecurity:    Worry: Not on file    Inability: Not on file  . Transportation needs:    Medical: Not on file    Non-medical: Not  on file  Tobacco Use  . Smoking status: Never Smoker  . Smokeless tobacco: Never Used  Substance and Sexual Activity  . Alcohol use: Yes    Alcohol/week: 0.0 standard drinks    Comment: rare  . Drug use: No  . Sexual activity: Yes    Partners: Male    Birth control/protection: Surgical    Comment: vasectomy  Lifestyle  . Physical activity:    Days per week: Not on file    Minutes per session: Not on file  . Stress: Not on file  Relationships  . Social connections:    Talks on phone: Not on file    Gets together: Not on file    Attends religious service: Not on file    Active member of club or organization: Not on file    Attends meetings of clubs or organizations: Not on file    Relationship status: Not on file  . Intimate partner violence:    Fear of current or ex partner: Not on file    Emotionally abused: Not on file    Physically abused: Not on file    Forced sexual activity: Not on file  Other Topics Concern  . Not on file  Social History Narrative  . Not on file    FAMILY HISTORY Family History  Problem Relation Age of Onset  . Multiple sclerosis Mother   .  Hypothyroidism Mother   . Diverticulosis Mother        intestinal obstruction  . Hyperlipidemia Father   . Heart disease Father        Had MI 2016  . Hypertension Father   . Skin cancer Father   . Lung cancer Maternal Aunt        56s  . Alcohol abuse Maternal Uncle   . Lung cancer Maternal Uncle        71s  . Cancer Maternal Uncle 60       kidney  . Stroke Maternal Grandmother   . Hypertension Maternal Grandmother   . Kidney disease Maternal Grandmother        had a kidney removed  . Diabetes Maternal Grandmother   . Hypertension Paternal Grandfather   . Colon cancer Paternal Grandfather   . Autoimmune disease Sister        alopecia   . Prostate cancer Maternal Grandfather   . Colon cancer Maternal Uncle     ALLERGIES:  is allergic to merthiolate [thimerosal]; sulfa antibiotics; and  sulfasalazine.  MEDICATIONS:  Current Outpatient Medications  Medication Sig Dispense Refill  . BIOTIN PO Take by mouth.    . pantoprazole (PROTONIX) 40 MG tablet Take 1 tablet by mouth daily.    . Cholecalciferol (VITAMIN D3) 2000 units capsule Take 1 capsule by mouth daily.    . Cyanocobalamin 2500 MCG CHEW Chew 1 tablet by mouth daily.    Marland Kitchen ibuprofen (ADVIL,MOTRIN) 200 MG tablet Take 200 mg by mouth every 6 (six) hours as needed.    . Multiple Minerals-Vitamins (CALCIUM & VIT D3 BONE HEALTH PO) Take by mouth.    . NON FORMULARY Take 1 tablet by mouth daily.    Marland Kitchen OVER THE COUNTER MEDICATION Take 1 tablet by mouth daily. tumeric    . OVER THE COUNTER MEDICATION Take 1 tablet by mouth daily. tumeric    . vitamin C (ASCORBIC ACID) 500 MG tablet Take 1 tablet by mouth daily.     No current facility-administered medications for this visit.     PHYSICAL EXAMINATION:  ECOG PERFORMANCE STATUS: 0 - Asymptomatic  Vitals:   02/20/18 1057  BP: 112/67  Pulse: (!) 51  Resp: 18  Temp: (!) 97.3 F (36.3 C)    Filed Weights   02/20/18 1057  Weight: 120 lb 8 oz (54.7 kg)     Physical Exam  Constitutional: She is oriented to person, place, and time and well-developed, well-nourished, and in no distress. No distress.  HENT:  Head: Normocephalic and atraumatic.  Nose: Nose normal.  Mouth/Throat: Oropharynx is clear and moist. No oropharyngeal exudate.  Eyes: Pupils are equal, round, and reactive to light. Conjunctivae and EOM are normal. Left eye exhibits no discharge. No scleral icterus.  Neck: Normal range of motion. Neck supple. No JVD present.   Small soft cervical lymph nodes bilateral, left supraclavicular node (+), appears more prominent.   Cardiovascular: Normal rate, regular rhythm and normal heart sounds.  No murmur heard. Pulmonary/Chest: Effort normal and breath sounds normal. No respiratory distress. She has no wheezes. She has no rales. She exhibits no tenderness.   Abdominal: Soft. Bowel sounds are normal. She exhibits no distension and no mass. There is no tenderness. There is no rebound.  Musculoskeletal: Normal range of motion. She exhibits no edema or tenderness.  Lymphadenopathy:    She has no cervical adenopathy.  Neurological: She is alert and oriented to person, place, and time. No cranial nerve  deficit. She exhibits normal muscle tone. Coordination normal.  Skin: Skin is warm and dry. No rash noted. She is not diaphoretic. No erythema.  Psychiatric: Affect and judgment normal.    LABORATORY DATA: I have personally reviewed the data as listed: CBC    Component Value Date/Time   WBC 6.8 02/20/2018 1044   RBC 4.15 02/20/2018 1044   HGB 12.7 02/20/2018 1044   HCT 37.1 02/20/2018 1044   PLT 275 02/20/2018 1044   MCV 89.3 02/20/2018 1044   MCH 30.6 02/20/2018 1044   MCHC 34.3 02/20/2018 1044   RDW 12.9 02/20/2018 1044   LYMPHSABS 1.0 02/20/2018 1044   MONOABS 0.4 02/20/2018 1044   EOSABS 0.1 02/20/2018 1044   BASOSABS 0.0 02/20/2018 1044   CMP Latest Ref Rng & Units 02/20/2018 08/29/2017 04/29/2017  Glucose 70 - 99 mg/dL 100(H) 92 105(H)  BUN 6 - 20 mg/dL _0 Creatinine 0.44 - 1.00 mg/dL 1.02(H) 0.90 0.94  Sodium 135 - 145 mmol/L 137 136 135  Potassium 3.5 - 5.1 mmol/L 4.0 4.1 4.1  Chloride 98 - 111 mmol/L 104 104 102  CO2 22 - 32 mmol/L _1 Calcium 8.9 - 10.3 mg/dL 9.5 9.7 9.9  Total Protein 6.5 - 8.1 g/dL 6.9 6.9 7.1  Total Bilirubin 0.3 - 1.2 mg/dL 0.9 0.6 0.7  Alkaline Phos 38 - 126 U/L 46 41 41  AST 15 - 41 U/L _2 ALT 0 - 44 U/L _3 #  Negative hepatitis panel and HIV.   Surgical Pathology 12/20/2016 CASE: [id number]  PATIENT: Carly Roberts  Surgical Pathology Report DIAGNOSIS:  A. LYMPH NODE, RIGHT GROIN; NEEDLE CORE BIOPSY:  - FOLLICULAR LYMPHOMA, GRADE 1-2, SEE COMMENT.   Comment: A sample was sent for flow cytometry ([employer], Accession [id number]) with the following  interpretation: CD10+ clonal B-cell population detected.  The case was sent to [facility] for consultation. The findings of the external consultant Dr. Angelena Form are incorporated above. This is the comment of the external consultant: Thank you for the opportunity to review this case. HE sections demonstrate small core needle biopsy tissue fragments with a dense lymphoid proliferation with a vaguely follicular architecture. The lymphocytes are composed of small mature-appearing forms. No significant population of large lymphocytes is detected. Immunohistochemical stains are performed and evaluated to further characterize this lymphoid proliferation. CD3 marks T cells and negatively highlights B-cell rich follicles. CD20 marks numerous B-cell rich follicles, as well as numerous interfollicular B cells. The B cells are positive for CD10, BCL 6 and BCL-2. CD5 marks T cells without overt coexpression in B cells, while a cyclin D1 immunostain is negative in lymphocytes. OI37 highlights follicular dendritic cell mesh works underlying most of the follicles. Ki-67 showed a low proliferation index (5%).  Per report, flow cytometric analysis reveals a CD10 positive clonal B-cell population. For details, see [facility] report. The  morphologic and immunophenotypic findings support a diagnosis of follicular lymphoma, grade 1-2, with a follicular pattern in this small submitted sample. Selected slides from this case were reviewed and discussed at our hematopathology consensus conference on 12/23/16.      RADIOGRAPHIC STUDIES: I have personally reviewed the radiological images as listed and agree with the findings in the report CT Neck and chest with contrast 12/08/2016  IMPRESSION: Mild adenopathy in the left supraclavicular region and bilateral axillary/ subpectoral regions. Findings concerning for possible lymphoproliferative disorder.Small bilateral 3 mm lower lobe  pulmonary nodules,  nonspecific.Recommend attention on follow-up imaging. PET scan 12/10/2016 IMPRESSION: 1. Pathologically enlarged Deauville 4 left supraclavicular and left subpectoral and axillary adenopathy in the chest. Pathologically enlarged total for retroperitoneal and pelvic adenopathy in the abdomen. 2. Upper normal sized Deauville 2 scattered lymph nodes in the neck. 3. Other imaging findings of potential clinical significance: Fibroid uterus. Suspected scarring of the left kidney.   PET 09/05/2017 IMPRESSION: 1. Adenopathy in the neck, chest, and abdomen/pelvis is minimally worsened in size and activity compared to the prior exam, currently primarily Deauville 4 disease. 2. Other imaging findings of potential clinical significance: Old granulomatous disease. Prominent stool throughout the colon favors constipation. Left kidney upper pole scarring. Uterine fibroids.    ASSESSMENT/PLAN Cancer Staging Follicular lymphoma of lymph nodes of multiple regions [facility]) Staging form: Hodgkin and Non-Hodgkin Lymphoma, AJCC 8th Edition - Clinical stage from 2/59/1028: Stage III (Follicular lymphoma) - Signed by Earlie Server, MD on 12/27/2016  1. 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 Cervical lymphadenopathy   3. Diarrhea, unspecified type    #Follicular lymphoma, stable.  No worsening of constitutional plan.  Continue watchful waiting. #Diarrhea, chronic. Discussed with patient that differential for her chronic diarrhea is broad including infection, chronic inflammation, versus involvement of follicular lymphoma in GI tract. I refer patient to see Dr. Vira Agar for an evaluation of colonoscopy and endoscopy for direct visualization and further evaluation of her chronic diarrhea which is a new symptom for her.   Follow up in 6 months with cbc cmp and ldh.   Earlie Server, MD, PhD Hematology Oncology [facility] at  [facility] Pager- [phone] 02/20/2018

## 2018-03-07 ENCOUNTER — Encounter: Payer: Self-pay | Admitting: Oncology

## 2018-03-20 ENCOUNTER — Ambulatory Visit: Payer: BC Managed Care – PPO | Admitting: Oncology

## 2018-03-20 ENCOUNTER — Other Ambulatory Visit: Payer: BC Managed Care – PPO

## 2018-03-26 ENCOUNTER — Other Ambulatory Visit
Admission: RE | Admit: 2018-03-26 | Discharge: 2018-03-26 | Disposition: A | Discharge status: Home / Self Care | Payer: BC Managed Care – PPO | Source: Ambulatory Visit | Attending: Student | Admitting: Student

## 2018-03-26 DIAGNOSIS — R197 Diarrhea, unspecified: Secondary | ICD-10-CM | POA: Diagnosis not present

## 2018-03-26 LAB — GASTROINTESTINAL PANEL BY PCR, STOOL (REPLACES STOOL CULTURE)

## 2018-03-26 LAB — C DIFFICILE QUICK SCREEN W PCR REFLEX
C DIFFICILE (CDIFF) TOXIN: NEGATIVE
C Diff antigen: NEGATIVE
C Diff interpretation: NOT DETECTED

## 2018-03-27 ENCOUNTER — Other Ambulatory Visit
Admission: RE | Admit: 2018-03-27 | Discharge: 2018-03-27 | Disposition: A | Discharge status: Home / Self Care | Payer: BC Managed Care – PPO | Source: Ambulatory Visit | Attending: Student | Admitting: Student

## 2018-03-29 LAB — CALPROTECTIN, FECAL: Calprotectin, Fecal: 131 ug/g — ABNORMAL HIGH (ref 0–120)

## 2018-03-30 LAB — PANCREATIC ELASTASE, FECAL: Pancreatic Elastase-1, Stool: 472 ug Elast./g (ref >200)

## 2018-04-16 DIAGNOSIS — K52832 Lymphocytic colitis: Secondary | ICD-10-CM

## 2018-04-16 HISTORY — PX: COLONOSCOPY: SHX174

## 2018-04-16 HISTORY — DX: Lymphocytic colitis: K52.832

## 2018-05-16 LAB — MISCELLANEOUS TEST

## 2018-08-02 ENCOUNTER — Encounter: Payer: Self-pay | Admitting: Oncology

## 2018-08-21 ENCOUNTER — Inpatient Hospital Stay: Payer: BC Managed Care – PPO

## 2018-08-21 ENCOUNTER — Inpatient Hospital Stay: Payer: BC Managed Care – PPO | Admitting: Oncology

## 2018-10-06 ENCOUNTER — Inpatient Hospital Stay: Payer: BC Managed Care – PPO | Admitting: Oncology

## 2018-10-06 ENCOUNTER — Inpatient Hospital Stay: Payer: BC Managed Care – PPO

## 2018-10-31 ENCOUNTER — Other Ambulatory Visit: Payer: Self-pay

## 2018-11-01 ENCOUNTER — Inpatient Hospital Stay: Payer: BC Managed Care – PPO | Attending: Oncology

## 2018-11-01 ENCOUNTER — Inpatient Hospital Stay (HOSPITAL_BASED_OUTPATIENT_CLINIC_OR_DEPARTMENT_OTHER): Payer: BC Managed Care – PPO | Admitting: Oncology

## 2018-11-01 ENCOUNTER — Encounter: Payer: Self-pay | Admitting: Oncology

## 2018-11-01 ENCOUNTER — Other Ambulatory Visit: Payer: Self-pay

## 2018-11-01 VITALS — BP 183/68 | HR 45 | Temp 97.0°F | Resp 18 | Wt 124.9 lb

## 2018-11-01 DIAGNOSIS — R918 Other nonspecific abnormal finding of lung field: Secondary | ICD-10-CM | POA: Diagnosis not present

## 2018-11-01 DIAGNOSIS — K52832 Lymphocytic colitis: Secondary | ICD-10-CM | POA: Diagnosis not present

## 2018-11-01 DIAGNOSIS — F329 Major depressive disorder, single episode, unspecified: Secondary | ICD-10-CM | POA: Diagnosis not present

## 2018-11-01 DIAGNOSIS — Z800 Family history of malignant neoplasm of digestive organs: Secondary | ICD-10-CM

## 2018-11-01 DIAGNOSIS — R011 Cardiac murmur, unspecified: Secondary | ICD-10-CM | POA: Diagnosis not present

## 2018-11-01 DIAGNOSIS — C8298 Follicular lymphoma, unspecified, lymph nodes of multiple sites: Secondary | ICD-10-CM | POA: Diagnosis present

## 2018-11-01 DIAGNOSIS — Z801 Family history of malignant neoplasm of trachea, bronchus and lung: Secondary | ICD-10-CM

## 2018-11-01 DIAGNOSIS — N6019 Diffuse cystic mastopathy of unspecified breast: Secondary | ICD-10-CM | POA: Insufficient documentation

## 2018-11-01 DIAGNOSIS — Z8051 Family history of malignant neoplasm of kidney: Secondary | ICD-10-CM

## 2018-11-01 DIAGNOSIS — I10 Essential (primary) hypertension: Secondary | ICD-10-CM | POA: Diagnosis not present

## 2018-11-01 DIAGNOSIS — D649 Anemia, unspecified: Secondary | ICD-10-CM | POA: Diagnosis not present

## 2018-11-01 DIAGNOSIS — N920 Excessive and frequent menstruation with regular cycle: Secondary | ICD-10-CM | POA: Insufficient documentation

## 2018-11-01 DIAGNOSIS — Z8042 Family history of malignant neoplasm of prostate: Secondary | ICD-10-CM | POA: Insufficient documentation

## 2018-11-01 LAB — CBC WITH DIFFERENTIAL/PLATELET
Abs Immature Granulocytes: 0.02 10*3/uL (ref 0.00–0.07)
Basophils Absolute: 0.1 10*3/uL (ref 0.0–0.1)
Basophils Relative: 1 %
Eosinophils Absolute: 0.1 10*3/uL (ref 0.0–0.5)
Eosinophils Relative: 2 %
HCT: 38.1 % (ref 36.0–46.0)
Hemoglobin: 12.6 g/dL (ref 12.0–15.0)
Immature Granulocytes: 0 %
Lymphocytes Relative: 20 %
Lymphs Abs: 1.1 10*3/uL (ref 0.7–4.0)
MCH: 29.9 pg (ref 26.0–34.0)
MCHC: 33.1 g/dL (ref 30.0–36.0)
MCV: 90.3 fL (ref 80.0–100.0)
Monocytes Absolute: 0.6 10*3/uL (ref 0.1–1.0)
Monocytes Relative: 10 %
Neutro Abs: 3.8 10*3/uL (ref 1.7–7.7)
Neutrophils Relative %: 67 %
Platelets: 270 10*3/uL (ref 150–400)
RBC: 4.22 MIL/uL (ref 3.87–5.11)
RDW: 12.6 % (ref 11.5–15.5)
WBC: 5.7 10*3/uL (ref 4.0–10.5)
nRBC: 0 % (ref 0.0–0.2)

## 2018-11-01 LAB — COMPREHENSIVE METABOLIC PANEL
ALT: 15 U/L (ref 0–44)
AST: 20 U/L (ref 15–41)
Albumin: 4.2 g/dL (ref 3.5–5.0)
Alkaline Phosphatase: 47 U/L (ref 38–126)
Anion gap: 7 (ref 5–15)
BUN: 15 mg/dL (ref 6–20)
CO2: 26 mmol/L (ref 22–32)
Calcium: 9 mg/dL (ref 8.9–10.3)
Chloride: 107 mmol/L (ref 98–111)
Creatinine, Ser: 0.99 mg/dL (ref 0.44–1.00)
GFR calc Af Amer: >60 mL/min (ref >60)
GFR calc non Af Amer: >60 mL/min (ref >60)
Glucose, Bld: 83 mg/dL (ref 70–99)
Potassium: 4.2 mmol/L (ref 3.5–5.1)
Sodium: 140 mmol/L (ref 135–145)
Total Bilirubin: 0.5 mg/dL (ref 0.3–1.2)
Total Protein: 7.2 g/dL (ref 6.5–8.1)

## 2018-11-01 LAB — LACTATE DEHYDROGENASE: LDH: 142 U/L (ref 98–192)

## 2018-11-01 NOTE — Progress Notes (Signed)
Patient here for follow up. Blood pressure elevated; denies lightheadedness or dizziness. She states low pulse is normal for her.

## 2018-11-02 DIAGNOSIS — K52832 Lymphocytic colitis: Secondary | ICD-10-CM | POA: Insufficient documentation

## 2018-11-02 NOTE — Progress Notes (Signed)
[facility] Follow Up Visit.   Patient Care Team: Rusty Aus, MD as PCP - General (Internal Medicine) Dalia Heading, CNM as Midwife (Certified Nurse Midwife) Bary Castilla, Forest Gleason, MD (General Surgery) Minna Merritts, MD as Consulting Physician (Cardiology)  REASON FOR VISIT Follow up for management of follicular lymphoma.    HISTORY OF PRESENTING ILLNESS: Carly Roberts 53 y.o. female who is referred by Dr.Oaks to [place] for evaluation and management of recently diagnosed follicular lymphoma. patientWas in her usual state of health until recently when she started to notice enlarging left neck mass she went to her primary care physician and ultrasound was done showing a few small lymph nodes. She continues to feel uncomfortableness in her neck and therefore a CT scan was performed. CT neck and chest with contrast on 12/08/2016 showed mild adenopathy in the left supraclavicular region and bilateral axillary/subpectoral small bilateral lower lobe nodules nonspecific. As followed by a PET scan on 12/10/2016 which showed pathologically enlarged Deauville 2 left supraclavicular and left subpectoral and axillary adenopathy in the chest. Pathologically enlarged nodes or retroperitoneal pelvic adenopathy in the abdomen. Normal size Deauville 2 scattered lymph nodes in the neck. Patient had ultrasound guided right inguinal node biopsy on 12/20/2016 and pathology showed follicular lymphoma, grade 1-2. KI 67 showed a low proliferation index 5%. Patient was referred to see me for discussion about treatment plan  # She was evaluate at [facility] by Dr.Grover who recommends repeating image scan. to get an idea of the pace of progression of her disease. She does not otherwise clearly meet GELF criteria except for mild symptoms related to neck lymphadenopathy as well as some mild anxiety related to deferring treatment, which is also a possible indication for treatment. If her disease burden  continues to be relatively low with slow disease pace, it is reasonable to continue watchful waiting vs discussion of single agent rituximab as a low toxicity alternative. We also discussed common side effects associated with rituximab. If her disease burden has increased, another option for treatment would be rituximab-bendamustine which likely would offer a longer remission than single agent rituximab but is also a more aggressive treatment   INTERVAL HISTORY Patient with history listed above reviewed by me present for follow-up of management of follicular cell lymphoma. Patient reports feeling at baseline. Her neck lymph nodes wax and wane. No significant weight loss, night sweating. Chronic fatigue level has not significantly changed. 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  She was told by other physicians that she has severe osteoarthritis. Colonoscopy on 04/06/2018 showed lymphocytic colitis and biopsies of the ascending colon and sigmoid.  She was prescribed on Entocort which she feels not helping with her symptoms, so she self discontinued. . Continue to have intermittent diarrhea. Her weight has been stable.  Gained 4 pounds since last visit.  Review of Systems  Constitutional: Positive for malaise/fatigue. Negative for chills, fever and weight loss.  HENT: Negative for congestion, ear discharge, ear pain, nosebleeds, sinus pain and sore throat.        Neck lymph nodes swelling, wax and wane.   Eyes: Negative for double vision, photophobia, pain, discharge and redness.  Respiratory: Negative for cough, hemoptysis, sputum production, shortness of breath and wheezing.   Cardiovascular: Negative for chest pain, palpitations, orthopnea, claudication and leg swelling.  Gastrointestinal: Positive for diarrhea. Negative for abdominal pain, blood in stool, constipation, heartburn, melena, nausea and vomiting.  Genitourinary: Negative for dysuria, flank pain, frequency and hematuria.  Musculoskeletal: Negative for back pain, myalgias and neck pain.  Skin: Negative for itching and rash.  Neurological: Negative for dizziness, tingling, tremors, focal weakness, weakness and headaches.  Endo/Heme/Allergies: Negative for environmental allergies. Does not bruise/bleed easily.  Psychiatric/Behavioral: Negative for depression, hallucinations, substance abuse and suicidal ideas. The patient is not nervous/anxious.     MEDICAL HISTORY: Past Medical History:  Diagnosis Date  . Anemia   . Anxiety and depression   . Cancer ([place]) 2018   Non Hodgkin's follicular lymphoma  . Chicken pox   . Fibrocystic breast 2011  . Heart murmur   . History of mammogram 05/27/2015   [id number]  . Hypertension   . Menorrhagia     SURGICAL HISTORY: Past Surgical History:  Procedure Laterality Date  . ABLATION  2008   endometrial ablation  . BREAST CYST ASPIRATION Bilateral 12/31/2015   FNA done by Dr. Bary Castilla  . BREAST EXCISIONAL BIOPSY Right 2005   Fibroadenoma, duct adenoma, right breast or o'clock.  . COLONOSCOPY  08/25/2011   removed 3 mm polyp Dr. Vira Agar  . DILATION AND CURETTAGE OF UTERUS    . HYSTEROSCOPY    . LYMPH NODE BIOPSY  2018  . WISDOM TOOTH EXTRACTION      SOCIAL HISTORY: Social History   Socioeconomic History  . Marital status: Married    Spouse name: Not on file  . Number of children: 2  . Years of education: 19  . Highest education level: Not on file  Occupational History  . Occupation: Armed forces training and education officer  Social Needs  . Financial resource strain: Not on file  . Food insecurity    Worry: Not on file    Inability: Not on file  . Transportation needs    Medical: Not on file    Non-medical: Not on file  Tobacco Use  . Smoking status: Never Smoker  . Smokeless tobacco: Never Used  Substance and Sexual Activity  . Alcohol use: Yes    Alcohol/week: 0.0 standard drinks     Comment: rare  . Drug use: No  . Sexual activity: Yes    Partners: Male    Birth control/protection: Surgical    Comment: vasectomy  Lifestyle  . Physical activity    Days per week: Not on file    Minutes per session: Not on file  . Stress: Not on file  Relationships  . Social Herbalist on phone: Not on file    Gets together: Not on file    Attends religious service: Not on file    Active member of club or organization: Not on file    Attends meetings of clubs or organizations: Not on file    Relationship status: Not on file  . Intimate partner violence    Fear of current or ex partner: Not on file    Emotionally abused: Not on file    Physically abused: Not on file    Forced sexual activity: Not on file  Other Topics Concern  . Not on file  Social History Narrative  . Not on file    FAMILY HISTORY Family History  Problem Relation Age of Onset  . Multiple sclerosis Mother   . Hypothyroidism Mother   . Diverticulosis Mother        intestinal obstruction  . Hyperlipidemia Father   . Heart disease Father        Had MI 2016  . Hypertension Father   .  Skin cancer Father   . Lung cancer Maternal Aunt        32s  . Alcohol abuse Maternal Uncle   . Lung cancer Maternal Uncle        37s  . Cancer Maternal Uncle 60       kidney  . Stroke Maternal Grandmother   . Hypertension Maternal Grandmother   . Kidney disease Maternal Grandmother        had a kidney removed  . Diabetes Maternal Grandmother   . Hypertension Paternal Grandfather   . Colon cancer Paternal Grandfather   . Autoimmune disease Sister        alopecia   . Prostate cancer Maternal Grandfather   . Colon cancer Maternal Uncle     ALLERGIES:  is allergic to merthiolate [thimerosal]; sulfa antibiotics; and sulfasalazine.  MEDICATIONS:  Current Outpatient Medications  Medication Sig Dispense Refill  . ibuprofen (ADVIL,MOTRIN) 200 MG tablet Take 200 mg by mouth every 6 (six) hours as needed.      No current facility-administered medications for this visit.     PHYSICAL EXAMINATION:  ECOG PERFORMANCE STATUS: 0 - Asymptomatic  Vitals:   11/01/18 1321  BP: (!) 183/68  Pulse: (!) 45  Resp: 18  Temp: (!) 97 F (36.1 C)    Filed Weights   11/01/18 1321  Weight: 124 lb 14.4 oz (56.7 kg)     Physical Exam  Constitutional: She is oriented to person, place, and time and well-developed, well-nourished, and in no distress. No distress.  HENT:  Head: Normocephalic and atraumatic.  Nose: Nose normal.  Mouth/Throat: Oropharynx is clear and moist. No oropharyngeal exudate.  Eyes: Pupils are equal, round, and reactive to light. Conjunctivae and EOM are normal. Left eye exhibits no discharge. No scleral icterus.  Neck: Normal range of motion. Neck supple. No JVD present.   Small soft cervical lymph nodes bilateral, left supraclavicular node (+), appears more prominent.   Cardiovascular: Normal rate, regular rhythm and normal heart sounds.  No murmur heard. Pulmonary/Chest: Effort normal and breath sounds normal. No respiratory distress. She has no wheezes. She has no rales. She exhibits no tenderness.  Abdominal: Soft. Bowel sounds are normal. She exhibits no distension and no mass. There is no abdominal tenderness. There is no rebound.  Musculoskeletal: Normal range of motion.        General: No tenderness or edema.  Lymphadenopathy:    She has no cervical adenopathy.  Neurological: She is alert and oriented to person, place, and time. No cranial nerve deficit. She exhibits normal muscle tone. Coordination normal.  Skin: Skin is warm and dry. No rash noted. She is not diaphoretic. No erythema.  Psychiatric: Affect and judgment normal.    LABORATORY DATA: I have personally reviewed the data as listed: CBC    Component Value Date/Time   WBC 5.7 11/01/2018 1248   RBC 4.22 11/01/2018 1248   HGB 12.6 11/01/2018 1248   HCT 38.1 11/01/2018 1248   PLT 270 11/01/2018 1248   MCV  90.3 11/01/2018 1248   MCH 29.9 11/01/2018 1248   MCHC 33.1 11/01/2018 1248   RDW 12.6 11/01/2018 1248   LYMPHSABS 1.1 11/01/2018 1248   MONOABS 0.6 11/01/2018 1248   EOSABS 0.1 11/01/2018 1248   BASOSABS 0.1 11/01/2018 1248   CMP Latest Ref Rng & Units 11/01/2018 02/20/2018 08/29/2017  Glucose 70 - 99 mg/dL 83 100(H) 92  BUN 6 - 20 mg/dL '15 13 14  '$ Creatinine 0.44 - 1.00 mg/dL 0.99  1.02(H) 0.90  Sodium 135 - 145 mmol/L 140 137 136  Potassium 3.5 - 5.1 mmol/L 4.2 4.0 4.1  Chloride 98 - 111 mmol/L 107 104 104  CO2 22 - 32 mmol/L '26 26 24  '$ Calcium 8.9 - 10.3 mg/dL 9.0 9.5 9.7  Total Protein 6.5 - 8.1 g/dL 7.2 6.9 6.9  Total Bilirubin 0.3 - 1.2 mg/dL 0.5 0.9 0.6  Alkaline Phos 38 - 126 U/L 47 46 41  AST 15 - 41 U/L '20 19 22  '$ ALT 0 - 44 U/L '15 13 16  '$ #  Negative hepatitis panel and HIV.   Surgical Pathology 12/20/2016 CASE: [id number]  PATIENT: Jayley LONG  Surgical Pathology Report DIAGNOSIS:  A. LYMPH NODE, RIGHT GROIN; NEEDLE CORE BIOPSY:  - FOLLICULAR LYMPHOMA, GRADE 1-2, SEE COMMENT.   Comment: A sample was sent for flow cytometry ([employer]/[facility], Accession [id number]) with the following interpretation: CD10+ clonal B-cell population detected.  The case was sent to [facility] for consultation. The findings of the external consultant Dr. Angelena Form are incorporated above. This is the comment of the external consultant: Thank you for the opportunity to review this case. HE sections demonstrate small core needle biopsy tissue fragments with a dense lymphoid proliferation with a vaguely follicular architecture. The lymphocytes are composed of small mature-appearing forms. No significant population of large lymphocytes is detected. Immunohistochemical stains are performed and evaluated to further characterize this lymphoid proliferation. CD3 marks T cells and negatively highlights B-cell rich follicles. CD20 marks numerous B-cell rich  follicles, as well as numerous interfollicular B cells. The B cells are positive for CD10, BCL 6 and BCL-2. CD5 marks T cells without overt coexpression in B cells, while a cyclin D1 immunostain is negative in lymphocytes. DD22 highlights follicular dendritic cell mesh works underlying most of the follicles. Ki-67 showed a low proliferation index (5%).  Per report, flow cytometric analysis reveals a CD10 positive clonal B-cell population. For details, see [place] report. The  morphologic and immunophenotypic findings support a diagnosis of follicular lymphoma, grade 1-2, with a follicular pattern in this small submitted sample. Selected slides from this case were reviewed and discussed at our hematopathology consensus conference on 12/23/16.      RADIOGRAPHIC STUDIES: I have personally reviewed the radiological images as listed and agree with the findings in the report CT Neck and chest with contrast 12/08/2016  IMPRESSION: Mild adenopathy in the left supraclavicular region and bilateral axillary/ subpectoral regions. Findings concerning for possible lymphoproliferative disorder.Small bilateral 3 mm lower lobe pulmonary nodules, nonspecific.Recommend attention on follow-up imaging. PET scan 12/10/2016 IMPRESSION: 1. Pathologically enlarged Deauville 4 left supraclavicular and left subpectoral and axillary adenopathy in the chest. Pathologically enlarged total for retroperitoneal and pelvic adenopathy in the abdomen. 2. Upper normal sized Deauville 2 scattered lymph nodes in the neck. 3. Other imaging findings of potential clinical significance: Fibroid uterus. Suspected scarring of the left kidney.   PET 09/05/2017 IMPRESSION: 1. Adenopathy in the neck, chest, and abdomen/pelvis is minimally worsened in size and activity compared to the prior exam, currently primarily Deauville 4 disease. 2. Other imaging findings of potential clinical significance: Old granulomatous disease.  Prominent stool throughout the colon favors constipation. Left kidney upper pole scarring. Uterine fibroids.    ASSESSMENT/PLAN Cancer Staging Follicular lymphoma of lymph nodes of multiple regions [facility]) Staging form: Hodgkin and Non-Hodgkin Lymphoma, AJCC 8th Edition - Clinical stage from 0/25/4270: Stage III (Follicular lymphoma) - Signed by Earlie Server, MD on 12/27/2016  1. Follicular lymphoma  of lymph nodes of multiple regions, unspecified follicular lymphoma type ([place])   2. Lymphocytic colitis    #Follicular lymphoma,  Stable.  No worsening of her constitutional symptoms.  Continue watchful waiting. Discussed about obtaining a PET scan for evaluation of lymphadenopathy. Patient prefers to do it in 6 months which I think is also reasonable.  #Intermittent diarrhea, chronic, has had work up done with gastroenterology Nina. No celiac disease.  she has lymphocytic colitis status post colonoscopy evaluation. Recommend patient to continue follow-up with gastroenterology.   Follow up in 6 months with cbc cmp and ldh. Orders Placed This Encounter  Procedures  . NM PET Image Restag (PS) Skull Base To Thigh    Standing Status:   Future    Standing Expiration Date:   11/01/2019    Order Specific Question:   ** REASON FOR EXAM (FREE TEXT)    Answer:   follicular lymphoma- restaging    Order Specific Question:   If indicated for the ordered procedure, I authorize the administration of a radiopharmaceutical per Radiology protocol    Answer:   Yes    Order Specific Question:   Is the patient pregnant?    Answer:   No    Order Specific Question:   Radiology Contrast Protocol - do NOT remove file path    Answer:   [email or web address]   We spent sufficient time to discuss many aspect of care, questions were answered to patient's satisfaction. Total face to face encounter time for this patient visit was 25 min. >50% of the time was  spent in counseling and  coordination of care.    Earlie Server, MD, PhD Hematology Oncology [facility] Pager- [phone] 11/02/2018

## 2018-11-30 ENCOUNTER — Encounter: Payer: Self-pay | Admitting: Oncology

## 2018-12-01 ENCOUNTER — Encounter: Payer: Self-pay | Admitting: Oncology

## 2018-12-05 ENCOUNTER — Encounter: Payer: Self-pay | Admitting: Oncology

## 2018-12-05 NOTE — Telephone Encounter (Signed)
Patient informed that we were not able to access the for via Internet.  She is going to print out the form and drop off at the front desk.  I will get Dr. Tasia Catchings to fill in medical provider section and return for via fax.

## 2018-12-07 ENCOUNTER — Encounter: Payer: Self-pay | Admitting: Oncology

## 2018-12-13 ENCOUNTER — Other Ambulatory Visit: Payer: Self-pay | Admitting: Certified Nurse Midwife

## 2018-12-13 DIAGNOSIS — Z1231 Encounter for screening mammogram for malignant neoplasm of breast: Secondary | ICD-10-CM

## 2019-01-03 NOTE — Progress Notes (Signed)
Gynecology Annual Exam  PCP: Rusty Aus, MD  Chief Complaint:  Chief Complaint  Patient presents with  . Gynecologic Exam    History of Present Illness:Carly Roberts  is a 53 year old Caucasian/White female , G 2 P 2 0 0 2 , who presents for her annual exam . She is having no significant GYN problems. Her menses are absent due to an endometrial ablation in 2008. She has had no spotting. She has had no hot flashes.   Since her last annual GYN exam dated 12/16/2017 , she was diagnosed with lymphocytic colitis. Had a colonoscopy 04/2018 for diarrhea. Her diarrhea lessened after the colonoscopy and she currently has about 2-3 stools/day Her past medical history is remarkable for Non -Hodgkin's follicular lymphoma. Her condition is being monitored by her oncologist. She is not receiving any chemotherapy for her lymphoma at this time..Will be having another PET scan this December. The patient's past medical history is also remarkable for fibrocystic changes in her breasts and fibroid uterus. Her last pelvic ultrasound last year revealed a 5.7cm x 8 cm fibroid on her right fundal area. Denies lower abdominal pain, but has been having intermittent pain in the right LS area. She is sexually active. Current contraception is vasectomy Her most recent pap smear was obtained 12/16/2017 and was NIL/ negative HRHPV. Her most recent mammogram/ ultrasound 01/04/2018 was negative..   There is no family history of breast cancer.  There is no family history of ovarian cancer.  The patient does do  monthly self breast exams.  The patient does not smoke.  The patient drinks alcohol rarely. The patient does not use illegal drugs.  The patient exercises occasionally by swimming and also is active doing yardwork The patient does get adequate calcium in her diet.  She had a recent cholesterol screen in 2017 that was borderline   The patient denies current symptoms of depression. She was on Zoloft for  a short time last year, but was weaned off.   Review of Systems: Review of Systems  Constitutional: Negative for chills, fever and weight loss.  HENT: Negative for congestion, sinus pain and sore throat.   Eyes: Positive for redness. Negative for blurred vision and pain.       Positive for eye irritation  Respiratory: Negative for hemoptysis, shortness of breath and wheezing.   Cardiovascular: Negative for chest pain, palpitations and leg swelling.  Gastrointestinal: Positive for diarrhea. Negative for abdominal pain, blood in stool, constipation, heartburn, nausea and vomiting.  Genitourinary: Negative for dysuria, frequency, hematuria and urgency.  Musculoskeletal: Positive for back pain (right lower back). Negative for myalgias.  Skin: Negative for itching and rash.  Neurological: Negative for dizziness, tingling and headaches.  Endo/Heme/Allergies: Negative for environmental allergies and polydipsia. Does not bruise/bleed easily.       Negative for hirsutism.   Psychiatric/Behavioral: Negative for depression. The patient is not nervous/anxious and does not have insomnia.     Past Medical History:  Past Medical History:  Diagnosis Date  . Anemia   . Anxiety and depression   . Cancer ([place]) 2018   Non Hodgkin's follicular lymphoma  . Chicken pox   . Fibrocystic breast 2011  . Heart murmur   . History of mammogram 05/27/2015   [id number]  . Hypertension   . Lymphocytic colitis 04/2018   Dr. Vira Agar  . Menorrhagia   . Uterine fibroid     Past Surgical History:  Past Surgical History:  Procedure  Laterality Date  . ABLATION  2008   endometrial ablation  . BREAST CYST ASPIRATION Bilateral 12/31/2015   FNA done by Dr. Bary Castilla  . BREAST EXCISIONAL BIOPSY Right 2005   Fibroadenoma, duct adenoma, right breast or o'clock.  . COLONOSCOPY  08/25/2011   removed 3 mm polyp Dr. Vira Agar  . COLONOSCOPY  04/2018   lymphocytic colitis  . DILATION AND CURETTAGE OF UTERUS    .  HYSTEROSCOPY    . LYMPH NODE BIOPSY  2018  . WISDOM TOOTH EXTRACTION      Family History:  Family History  Problem Relation Age of Onset  . Multiple sclerosis Mother   . Hypothyroidism Mother   . Diverticulosis Mother        intestinal obstruction  . Hyperlipidemia Father   . Heart disease Father        Had MI 2016  . Hypertension Father   . Skin cancer Father   . Lung cancer Maternal Aunt        87s  . Alcohol abuse Maternal Uncle   . Lung cancer Maternal Uncle        44s  . Cancer Maternal Uncle 60       kidney  . Stroke Maternal Grandmother   . Hypertension Maternal Grandmother   . Kidney disease Maternal Grandmother        had a kidney removed  . Diabetes Maternal Grandmother   . Hypertension Paternal Grandfather   . Colon cancer Paternal Grandfather   . Autoimmune disease Sister        alopecia   . Prostate cancer Maternal Grandfather   . Colon cancer Maternal Uncle     Social History:  Social History   Socioeconomic History  . Marital status: Married    Spouse name: Not on file  . Number of children: 2  . Years of education: 48  . Highest education level: Not on file  Occupational History  . Occupation: Armed forces training and education officer  Social Needs  . Financial resource strain: Not on file  . Food insecurity    Worry: Not on file    Inability: Not on file  . Transportation needs    Medical: Not on file    Non-medical: Not on file  Tobacco Use  . Smoking status: Never Smoker  . Smokeless tobacco: Never Used  Substance and Sexual Activity  . Alcohol use: Yes    Alcohol/week: 0.0 standard drinks    Comment: rare  . Drug use: No  . Sexual activity: Yes    Partners: Male    Birth control/protection: Surgical    Comment: vasectomy  Lifestyle  . Physical activity    Days per week: Not on file    Minutes per session: Not on file  . Stress: Not on file  Relationships  . Social Herbalist on phone: Not on file    Gets together: Not on file     Attends religious service: Not on file    Active member of club or organization: Not on file    Attends meetings of clubs or organizations: Not on file    Relationship status: Not on file  . Intimate partner violence    Fear of current or ex partner: Not on file    Emotionally abused: Not on file    Physically abused: Not on file    Forced sexual activity: Not on file  Other Topics Concern  . Not on file  Social History Narrative  .  Not on file    Allergies:  Allergies  Allergen Reactions  . Merthiolate [Thimerosal] Rash  . Sulfa Antibiotics Rash  . Sulfasalazine Rash    Medications: Current Outpatient Medications on File Prior to Visit  Medication Sig Dispense Refill  . ibuprofen (ADVIL,MOTRIN) 200 MG tablet Take 200 mg by mouth every 6 (six) hours as needed.    . pantoprazole (PROTONIX) 20 MG tablet Take 1 tablet by mouth daily.     No current facility-administered medications on file prior to visit.   Calcium, zinc and magnesium supplement  Physical Exam Vitals: BP 130/80   Pulse (!) 48   Ht 5\' 2"  (1.575 m)   Wt 124 lb 12.8 oz (56.6 kg)   LMP  (LMP Unknown)   BMI 22.83 kg/m   General: petite WF in NAD HEENT: normocephalic, anicteric Neck: no thyroid enlargement, no palpable nodules, shoddy cervical lymphadenopathy. Pulmonary: No increased work of breathing, CTAB Cardiovascular: RRR, without murmur  Breast: Breasts soft, no tenderness,  no skin or nipple retraction present, no nipple discharge. No masses in left breast. There is a 1 cm round mobile mass in the right breast at 10 O'clock near the aereola.  No axillary, infraclavicular or supraclavicular lymphadenopathy.  Abdomen: Soft, non-tender, non-distended.  Umbilicus without lesions.  No hepatomegaly or masses palpable. No evidence of hernia. Genitourinary:  External: Normal external female genitalia.  Normal urethral meatus, normal Bartholin's and Skene's glands.    Vagina: Normal vaginal mucosa, no evidence  of prolapse.    Cervix: Grossly normal in appearance, no bleeding, non-tender, deviated to the left  Uterus: Enlarged uterus, irregular contour, 12-14 week size and non-tender  Adnexa: fullness in right adnexal area, NT. Uterus or fibroid in left adnexa, NT  Rectal: deferred  Lymphatic: no evidence of inguinal lymphadenopathy Extremities: no edema, erythema, or tenderness Neurologic: Grossly intact Psychiatric: mood appropriate, affect full    Assessment: 53 y.o. G2P2 well woman exam Right breast mass and history of bilateral breast cysts Possible change in size of fibroid. R/O adnexal masses   Plan:   1) Breast cancer screening - 3D mammogram already scheduled for 01/16/2019  2) Cervical cancer screening - Pap was not  done. ASCCP guidelines and rational discussed.  Patient opts for q2-3 year screening interval  3) Contraception -vasectomy  4) Routine healthcare maintenance including cholesterol and diabetes screening managed by PCP  5) Colon cancer screening- recent colonoscopy  6) Pelvic ultrasound ordered.   Dalia Heading, CNM  Addendum  Pelvic ultrasound done and reveals that the fibroids are growing:  The uterus is anteverted and measures 9.7 x 8.4 x 9.4 cm. Echo texture is heterogenous with evidence of focal masses. Within the uterus are multiple suspected fibroids measuring: Fibroid 1: 81.3 x 64.1 x 72.7 mm subserosal right Fibroid 2: 66.5 x 53.0 x 60.0 mm subserosal left  Fibroid 3: 21.9 x 15.9 x 21.6 mm subserosal inferior  The Endometrium is not visualized  Right Ovary is not visualized.  Left Ovary measures 3.3 x 2.8 x 2.0 cm. It is not normal in appearance. There is a small complex cyst in the left ovary measuring 18 x 18 x 19 mm. No blood flow is seen within.  Survey of the adnexa demonstrates no adnexal masses. There is no free fluid in the cul de sac.  Impression: 1. There are three uterine fibroids seen.  2. The endometrium is not visible.   3. Normal right ovary. 4. There is a small complex cyst in the  left ovary. No blood flow is seen within.   Will discuss results with MD and call patient at home.   Dalia Heading, CNM

## 2019-01-04 ENCOUNTER — Encounter: Payer: Self-pay | Admitting: Certified Nurse Midwife

## 2019-01-04 ENCOUNTER — Other Ambulatory Visit: Payer: Self-pay

## 2019-01-04 ENCOUNTER — Ambulatory Visit (INDEPENDENT_AMBULATORY_CARE_PROVIDER_SITE_OTHER): Payer: BC Managed Care – PPO | Admitting: Certified Nurse Midwife

## 2019-01-04 ENCOUNTER — Other Ambulatory Visit (INDEPENDENT_AMBULATORY_CARE_PROVIDER_SITE_OTHER): Payer: BC Managed Care – PPO

## 2019-01-04 VITALS — BP 130/80 | HR 48 | Ht 62.0 in | Wt 124.8 lb

## 2019-01-04 DIAGNOSIS — N6019 Diffuse cystic mastopathy of unspecified breast: Secondary | ICD-10-CM

## 2019-01-04 DIAGNOSIS — D252 Subserosal leiomyoma of uterus: Secondary | ICD-10-CM | POA: Diagnosis not present

## 2019-01-04 DIAGNOSIS — D251 Intramural leiomyoma of uterus: Secondary | ICD-10-CM

## 2019-01-04 DIAGNOSIS — Z1239 Encounter for other screening for malignant neoplasm of breast: Secondary | ICD-10-CM

## 2019-01-04 DIAGNOSIS — N83202 Unspecified ovarian cyst, left side: Secondary | ICD-10-CM | POA: Diagnosis not present

## 2019-01-04 DIAGNOSIS — Z01419 Encounter for gynecological examination (general) (routine) without abnormal findings: Secondary | ICD-10-CM | POA: Diagnosis not present

## 2019-01-04 DIAGNOSIS — N6310 Unspecified lump in the right breast, unspecified quadrant: Secondary | ICD-10-CM

## 2019-01-07 ENCOUNTER — Encounter: Payer: Self-pay | Admitting: Certified Nurse Midwife

## 2019-01-10 ENCOUNTER — Telehealth: Payer: Self-pay

## 2019-01-10 NOTE — Telephone Encounter (Signed)
Patient inquiring if CLG has consulted w/MD about her 01/04/2019 u/s (fibroids and plan of care) cb# [phone]

## 2019-01-11 ENCOUNTER — Telehealth: Payer: Self-pay | Admitting: Obstetrics and Gynecology

## 2019-01-11 NOTE — Telephone Encounter (Signed)
-----   Message from Dalia Heading, [place] sent at 01/11/2019  8:24 AM EDT ----- Regarding: scheduling appointment [place], please call this patient and schedule her an appointment for a conference regarding her fibroids with Dr Georgianne Fick. I told her we could make an exception and let her husband come with her for this appointment. Is that OK? As long as both wear masks. She has a complicated history with some other health issues and wants him to be a part of the decision making process. Thanks, [employer]

## 2019-01-11 NOTE — Telephone Encounter (Signed)
Patient is schedule 01/17/19 with AMS. Patient aware that it is [facility] policy that appointments are patient only at this time. We may be able to provide a telephone consult for husband to be apart of the visit

## 2019-01-11 NOTE — Telephone Encounter (Signed)
Discussed case with Dr Georgianne Fick. Will get patient an appointment to discuss fibroids with Dr Georgianne Fick. Called patient and spoke with her regarding having her see Dr Georgianne Fick and she agrees with this plan.

## 2019-01-16 ENCOUNTER — Ambulatory Visit
Admission: RE | Admit: 2019-01-16 | Discharge: 2019-01-16 | Disposition: A | Discharge status: Home / Self Care | Payer: BC Managed Care – PPO | Source: Ambulatory Visit | Attending: Certified Nurse Midwife | Admitting: Certified Nurse Midwife

## 2019-01-16 DIAGNOSIS — Z1231 Encounter for screening mammogram for malignant neoplasm of breast: Secondary | ICD-10-CM | POA: Insufficient documentation

## 2019-01-17 ENCOUNTER — Encounter: Payer: Self-pay | Admitting: Obstetrics and Gynecology

## 2019-01-17 ENCOUNTER — Other Ambulatory Visit: Payer: Self-pay

## 2019-01-17 ENCOUNTER — Ambulatory Visit: Payer: BC Managed Care – PPO | Admitting: Obstetrics and Gynecology

## 2019-01-17 VITALS — BP 119/64 | HR 55 | Ht 62.0 in | Wt 124.0 lb

## 2019-01-17 DIAGNOSIS — N912 Amenorrhea, unspecified: Secondary | ICD-10-CM | POA: Diagnosis not present

## 2019-01-17 DIAGNOSIS — D251 Intramural leiomyoma of uterus: Secondary | ICD-10-CM

## 2019-01-17 NOTE — Progress Notes (Signed)
Obstetrics & Gynecology Office Visit   Chief Complaint:  Chief Complaint  Patient presents with  . Advice Only    Referred by CLG for uterine Fibroids    History of Present Illness: 53 y.o. [id number] presenting for consultation of management of uterine fibroids.  These have been followed for sometime and have remained overall stable in size based on my review of imaging.  The patients past medical history is notable for lymphocytic colitis being followed by Dr. Tiffany Kocher and stage IV follicular lymphoma.  The patient has not undergone prior chemotherapy or radiation therapy but is being followed with PET scans given the slowly progressive nature of her type oh lymphoma.  She has not had an abnormal uterine bleeding but does have a history of prior endometrial ablation dating back to 2008.  No significant vasomotor symptoms.  She does have regular right sided pelvic pain which is the ipsilateral side of her dominant uterine fibroid.  Prior MRI of the right hip also showed significant osteoarthritis.   Review of Systems: Review of Systems  Constitutional: Negative.   Gastrointestinal: Positive for abdominal pain.  Genitourinary: Negative.      Past Medical History:  Past Medical History:  Diagnosis Date  . Anemia   . Anxiety and depression   . Cancer ([place]) 2018   Non Hodgkin's follicular lymphoma  . Chicken pox   . Fibrocystic breast 2011  . Heart murmur   . History of mammogram 05/27/2015   birads2  . Hypertension   . Lymphocytic colitis 04/2018   Dr. Vira Agar  . Menorrhagia   . Uterine fibroid     Past Surgical History:  Past Surgical History:  Procedure Laterality Date  . ABLATION  2008   endometrial ablation  . BREAST CYST ASPIRATION Bilateral 12/31/2015   FNA done by Dr. Bary Castilla  . BREAST EXCISIONAL BIOPSY Right 2005   Fibroadenoma, duct adenoma, right breast or o'clock.  . COLONOSCOPY  08/25/2011   removed 3 mm polyp Dr. Vira Agar  . COLONOSCOPY  04/2018   lymphocytic colitis  . DILATION AND CURETTAGE OF UTERUS    . HYSTEROSCOPY    . LYMPH NODE BIOPSY  2018  . WISDOM TOOTH EXTRACTION      Gynecologic History: No LMP recorded (lmp unknown). Patient has had an ablation.  Obstetric History: [id number]  Family History:  Family History  Problem Relation Age of Onset  . Multiple sclerosis Mother   . Hypothyroidism Mother   . Diverticulosis Mother        intestinal obstruction  . Hyperlipidemia Father   . Heart disease Father        Had MI 2016  . Hypertension Father   . Skin cancer Father   . Lung cancer Maternal Aunt        57s  . Alcohol abuse Maternal Uncle   . Lung cancer Maternal Uncle        79s  . Cancer Maternal Uncle 60       kidney  . Stroke Maternal Grandmother   . Hypertension Maternal Grandmother   . Kidney disease Maternal Grandmother        had a kidney removed  . Diabetes Maternal Grandmother   . Hypertension Paternal Grandfather   . Colon cancer Paternal Grandfather   . Autoimmune disease Sister        alopecia   . Prostate cancer Maternal Grandfather   . Colon cancer Maternal Uncle     Social History:  Social History  Socioeconomic History  . Marital status: Married    Spouse name: Not on file  . Number of children: 2  . Years of education: 79  . Highest education level: Not on file  Occupational History  . Occupation: Armed forces training and education officer  Social Needs  . Financial resource strain: Not on file  . Food insecurity    Worry: Not on file    Inability: Not on file  . Transportation needs    Medical: Not on file    Non-medical: Not on file  Tobacco Use  . Smoking status: Never Smoker  . Smokeless tobacco: Never Used  Substance and Sexual Activity  . Alcohol use: Yes    Alcohol/week: 0.0 standard drinks    Comment: rare  . Drug use: No  . Sexual activity: Yes    Partners: Male    Birth control/protection: Surgical    Comment: vasectomy  Lifestyle  . Physical activity    Days per week:  Not on file    Minutes per session: Not on file  . Stress: Not on file  Relationships  . Social Herbalist on phone: Not on file    Gets together: Not on file    Attends religious service: Not on file    Active member of club or organization: Not on file    Attends meetings of clubs or organizations: Not on file    Relationship status: Not on file  . Intimate partner violence    Fear of current or ex partner: Not on file    Emotionally abused: Not on file    Physically abused: Not on file    Forced sexual activity: Not on file  Other Topics Concern  . Not on file  Social History Narrative  . Not on file    Allergies:  Allergies  Allergen Reactions  . Merthiolate [Thimerosal] Rash  . Sulfa Antibiotics Rash  . Sulfasalazine Rash    Medications: Prior to Admission medications   Medication Sig Start Date End Date Taking? Authorizing Provider  ibuprofen (ADVIL,MOTRIN) 200 MG tablet Take 200 mg by mouth every 6 (six) hours as needed.   Yes [provider]  pantoprazole (PROTONIX) 20 MG tablet Take 1 tablet by mouth daily. 11/09/18  Yes [provider]    Physical Exam Vitals:  Vitals:   01/17/19 1453  BP: 119/64  Pulse: (!) 55   No LMP recorded (lmp unknown). Patient has had an ablation.  General: NAD HEENT: normocephalic, anicteric Thyroid: no enlargement, no palpable nodules Pulmonary: No increased work of breathing Cardiovascular: RRR, distal pulses 2+ Abdomen: NABS, soft, non-tender, non-distended.  Umbilicus without lesions.  No hepatomegaly, splenomegaly or masses palpable. No evidence of hernia  Genitourinary:  External: Normal external female genitalia.  Normal urethral meatus, normal  Bartholin's and Skene's glands.    Vagina: Normal vaginal mucosa, no evidence of prolapse.    Cervix: Grossly normal in appearance, no bleeding  Uterus: Non-enlarged, mobile, normal contour.  No CMT  Adnexa: ovaries non-enlarged, no adnexal masses   Rectal: deferred  Lymphatic: no evidence of inguinal lymphadenopathy Extremities: no edema, erythema, or tenderness Neurologic: Grossly intact Psychiatric: mood appropriate, affect full  Female chaperone present for pelvic  portions of the physical exam  TVUS 01/12/2019 Fibroid 1: 81.3 x 64.1 x 72.7 mm subserosal right Fibroid 2: 66.5 x 53.0 x 60.0 mm subserosal left  Fibroid 3: 21.9 x 15.9 x 21.6 mm subserosal inferior  TVUS 01/17/2018 Right subserosal fibroid 8.01 x 5.65cm  Assessment: 53 y.o. G2P2002 follow up to discuss management of uterine fibroids  Plan: Problem List Items Addressed This Visit      Genitourinary   Uterine fibroid    Other Visit Diagnoses    Amenorrhea    -  Primary   Relevant Orders   Follicle stimulating hormone   Estradiol     1) Uterine fibroids - FSH, estradiol to assess menopausal status.  We discussed overall incidence and clinical history of fibroids.  Approximately 50% of women will develop uterine fibroids, the natural history is slow but continued growth until menopause.  Following menopause shrinkage is usually observed.  Cancerous fibroids or leiomyosarcoma are a rare entity and usually characterized by rapid growth.  Comparing both ultrasound and PET imaging back to 2018 overall size appears relatively stable. We discussed menopausal status would affect management option but these include A) expectant management B) Depo leupron if premenopausal C) Uterine artery embolization D) Myomectomy (although we discussed this is generally for patients wishing to preserve fertility) E) Hysterectomy  Given lower morbidity associated with B and C those would be my initial recommendations.  If labs consistent with menopause depo Lupron would not be of any additional benefit and I would prefer [place] in that setting.  We discussed that if the pain she is experiencing is related to mass effect of her uterine fibroids she may not improvement in her symptoms.   However if her pain is secondary to her hip arthritis she may see no improvement.  2) PET imaging 12/10/2016 enlarged uterus no significant increased radiotracer uptake.  MRI of right hip showing moderate osteoarthritis in that hip as well as largest fibroid on that side approximately 6cm at that time.   Discussed expectant, [place], leupron, mymectomy, and hysterectomy  3) Prior imaging studies were independently reviewed by myself  4) A total of 20 minutes were spent in face-to-face contact with the patient during this encounter with over half of that time devoted to counseling and coordination of care.  5) Return if symptoms worsen or fail to improve, will call with labs.    Malachy Mood, MD, [facility], [facility] 01/17/2019, 7:33 PM

## 2019-01-18 LAB — ESTRADIOL: Estradiol: 48.2 pg/mL

## 2019-01-18 LAB — FOLLICLE STIMULATING HORMONE: FSH: 12.8 m[IU]/mL

## 2019-02-27 IMAGING — CT NM PET TUM IMG INITIAL (PI) SKULL BASE T - THIGH
11 series · 23 of 25 positions shown · non-contrast
Comparison: CT scan dated 12/08/2016

CLINICAL DATA: Initial treatment strategy for pulmonary lymphoma.

EXAM:
NUCLEAR MEDICINE PET SKULL BASE TO THIGH
TECHNIQUE: 13.1 mCi F-18 FDG was injected intravenously. Full-ring PET imaging
was performed from the skull base to thigh after the radiotracer. CT
data was obtained and used for attenuation correction and anatomic
localization.
FASTING BLOOD GLUCOSE:  Value: 94 mg/dl

[Series 3: ct wb 5.0 b30f · axial · 5.0mm · 0.98mm/px · z∈[-1480,-612]mm · 3 of 290 slices shown]
[im 1/290]
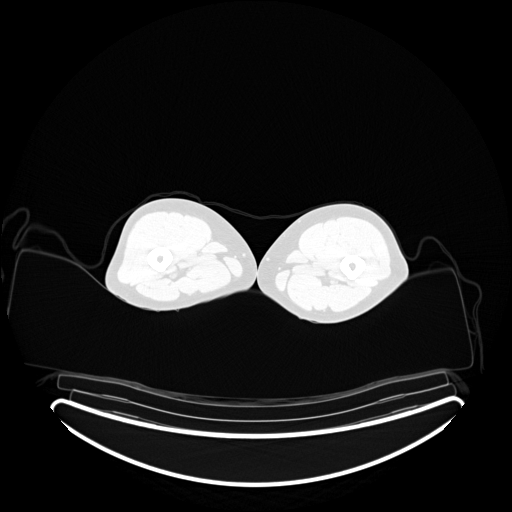
[im 145/290]
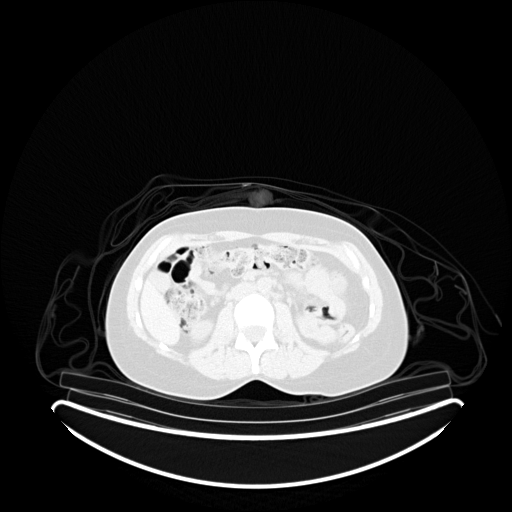
[im 290/290  brain]
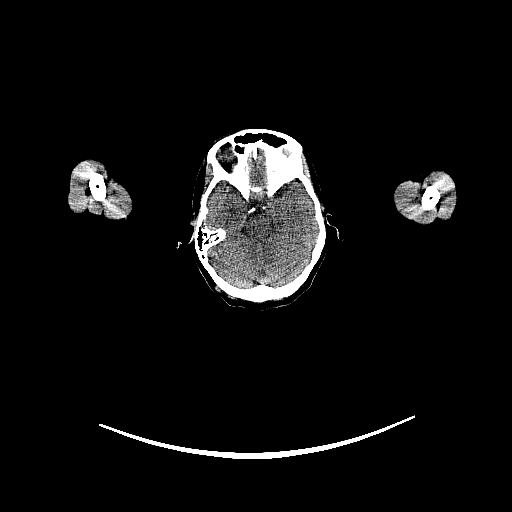

[Series 4: pet wb (ac) · axial · 5.0mm · 2.55mm/px · z∈[-1480,-612]mm · 3 of 290 slices shown]
[im 1/290]
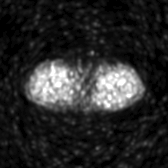
[im 145/290]
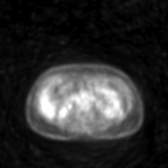
[im 290/290]
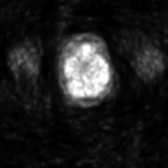

[Series 5: pet wb uncorrected (nac) · axial · 5.0mm · 4.07mm/px · z∈[-1048,-612]mm · 2 of 290 slices shown]
[im 145/290]
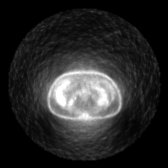
[im 290/290]
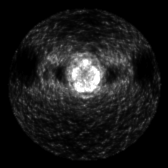

[Series 603: fused axial · 4 of 290 slices shown]
[im 1/290]
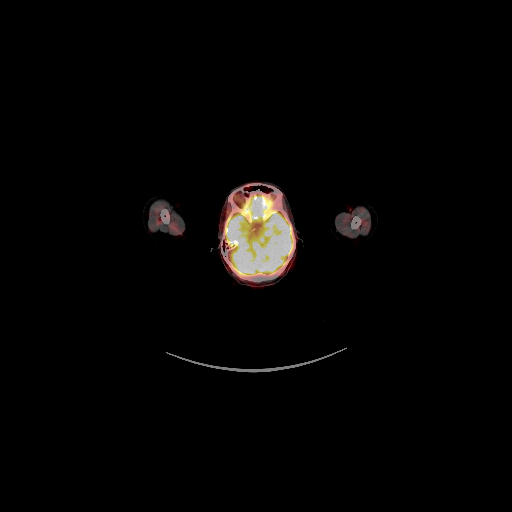
[im 97/290]
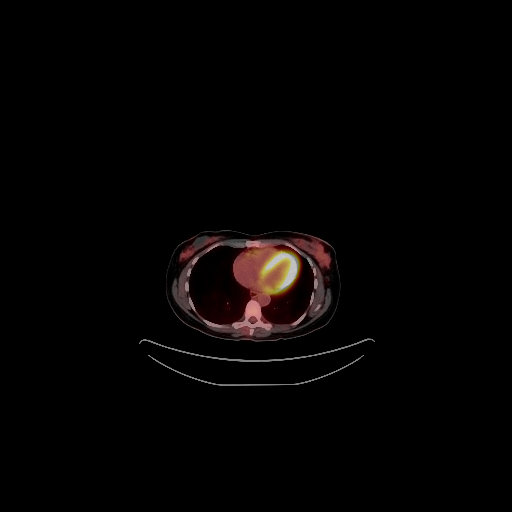
[im 193/290]
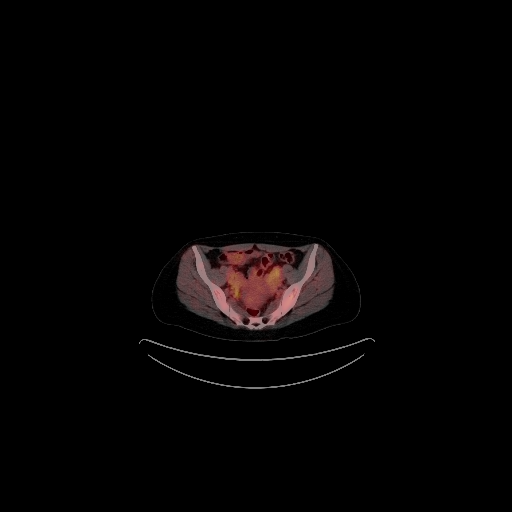
[im 290/290]
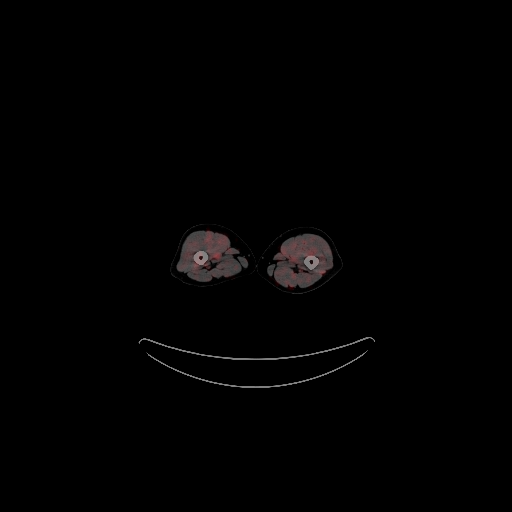

[Series 604: fused coronal · 1 of 87 slices shown]
[im 1/87]
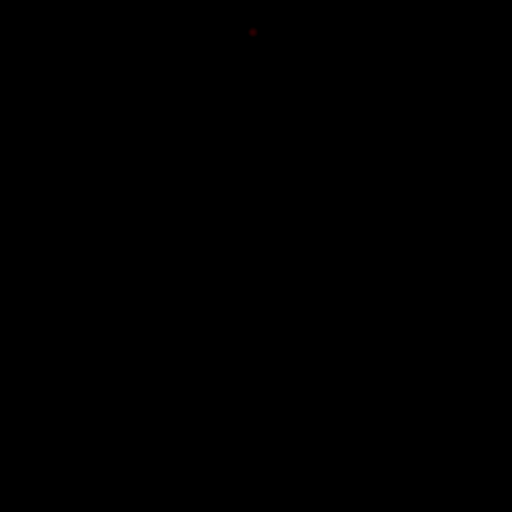

[Series 605: fused sagittal · 2 of 121 slices shown]
[im 1/121]
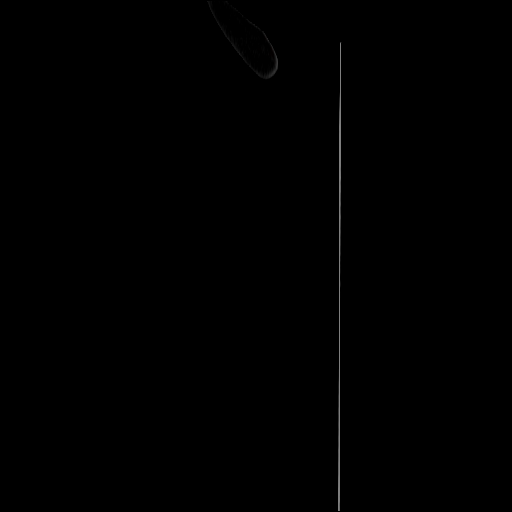
[im 121/121]
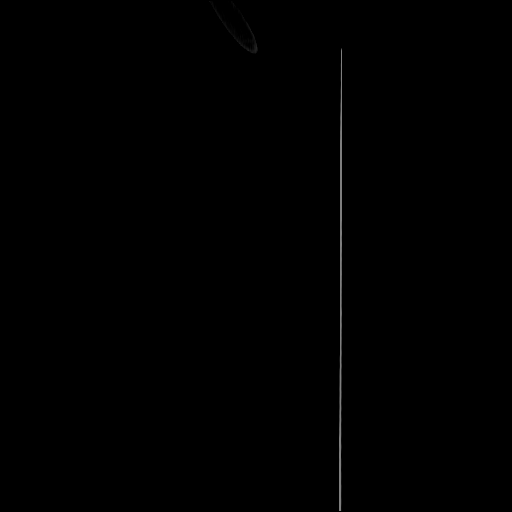

[Series 606: pet axial · 3 of 288 slices shown]
[im 1/288]
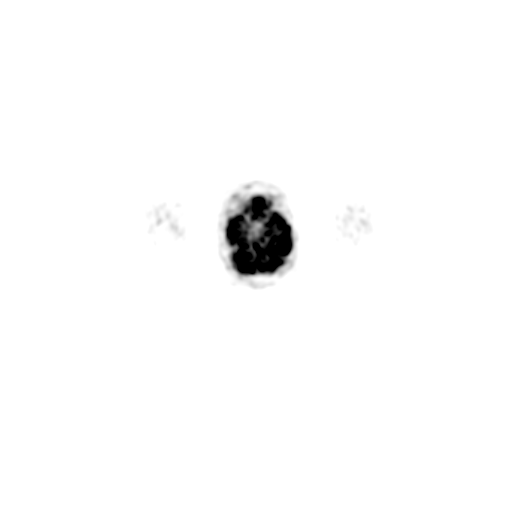
[im 96/288]
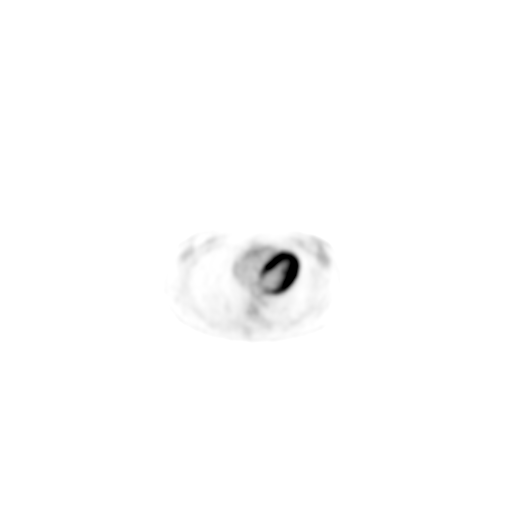
[im 288/288]
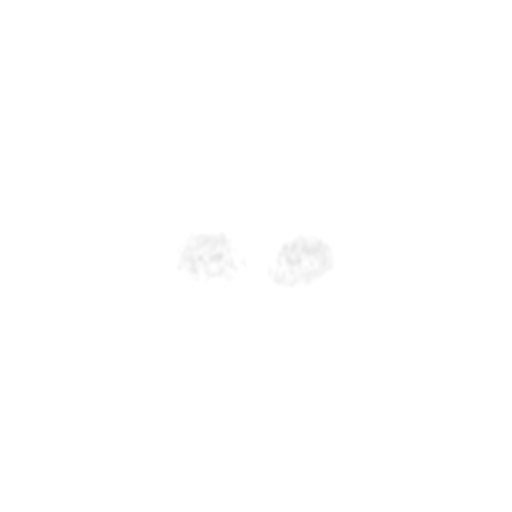

[Series 607: pet coronal · 1 of 93 slices shown]
[im 1/93]
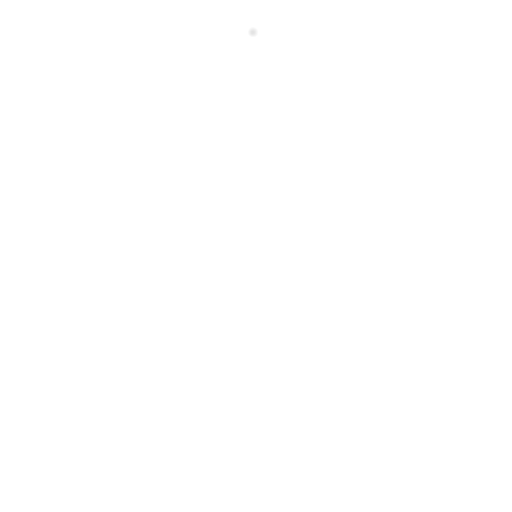

[Series 608: pet sagittal · 2 of 127 slices shown]
[im 1/127]
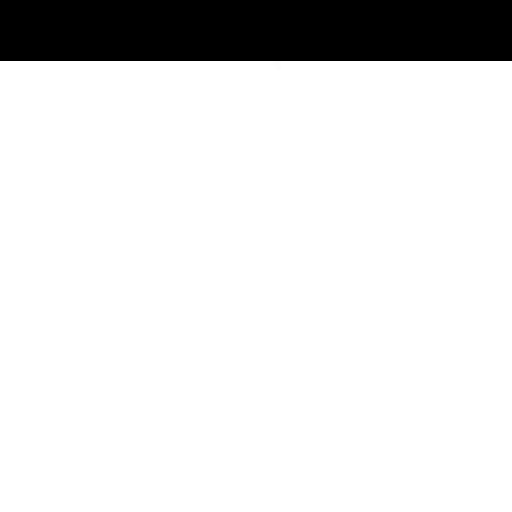
[im 127/127]
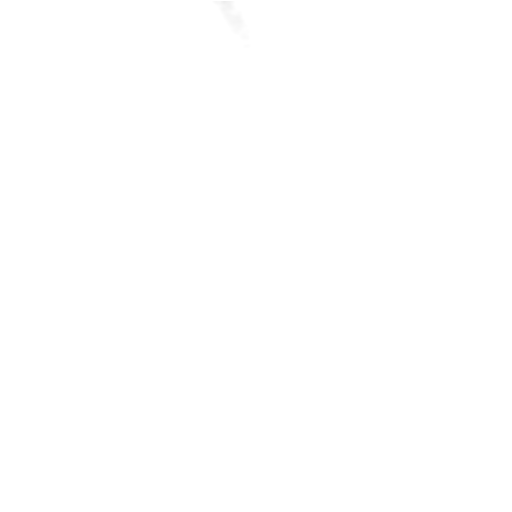

[Series 1032: results mm oncology reading · 5.0mm · 0.59mm/px · 1 of 4 slices shown (1 of 2)]
[im 1/4]
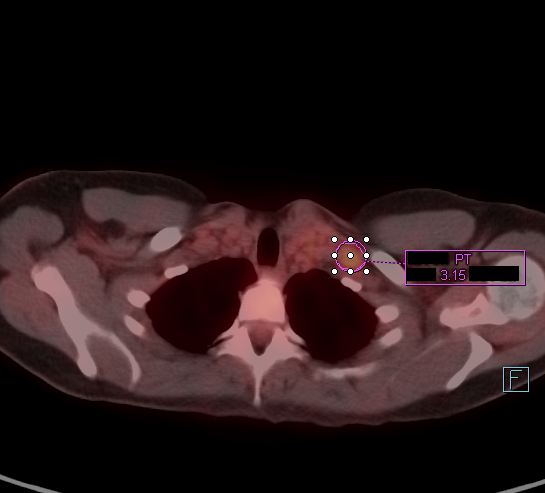

[Series 1125: results mm oncology reading · 5.0mm · 0.46mm/px · 1 of 10 slices shown (2 of 2)]
[im 1/10]
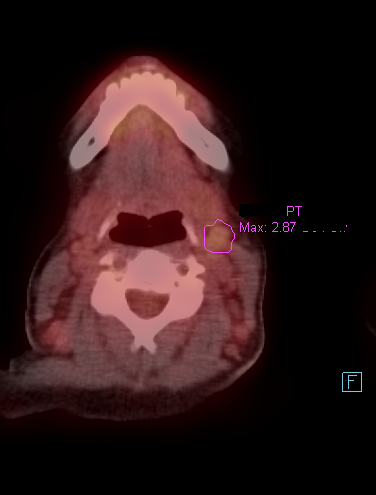

[23 of 25 positions shown; findings below may reference images not displayed]

FINDINGS: NECK

Multiple mostly small level IIa, IIb, III, IV, and V lymph nodes are
present bilaterally. An index left level V lymph node measuring
cm in short axis on image 32/3 has a maximum SUV of 1.9 ([HOSPITAL]
2).

CHEST

Bilateral mildly hypermetabolic axillary and subpectoral adenopathy
noted along with mild left supraclavicular adenopathy. Dominant left
supraclavicular node measures 1.1 cm in short axis on image 52/3
with maximum standard uptake value 3.2. Index left subpectoral lymph
node measuring 1.2 cm in short axis on image 64/ 3 has a maximum
standard uptake value of 3.7 ([HOSPITAL] 4).

3 mm left lower lobe subpleural pulmonary nodule on image 100/3
persists, no visible hypermetabolic activity although this is well
below sensitive PET-CT size thresholds.

Background mediastinal blood pool activity 2.5.

ABDOMEN/PELVIS

Pathologic periaortic, common iliac, external iliac, and inguinal
adenopathy observed. Left common iliac node measuring 1.8 cm in
short axis has maximum standard uptake value 4.6 ([HOSPITAL] 4). An
index right external iliac node measuring 1.5 cm in short axis on
image 201/3 has a maximum SUV of 4.1.

Background hepatic activity SUV 2.6.

No abnormal splenic activity or splenomegaly.

Considerably enlarged uterus with multilobular appearance but
without overt hypermetabolic activity, favoring a fibroid uterus.

Dependent density in the gallbladder, potentially sludge or small
gallstones.

Somewhat diminutive and irregular left kidney, possibly from
scarring.

SKELETON

No focal hypermetabolic activity to suggest skeletal metastasis.
IMPRESSION: 1. Pathologically enlarged [HOSPITAL] 4 left supraclavicular and left
subpectoral and axillary adenopathy in the chest. Pathologically
enlarged total for retroperitoneal and pelvic adenopathy in the
abdomen.
2. Upper normal sized [HOSPITAL] 2 scattered lymph nodes in the neck.
3. Other imaging findings of potential clinical significance:
Fibroid uterus. Suspected scarring of the left kidney.

## 2019-04-16 ENCOUNTER — Encounter: Payer: Self-pay | Admitting: Oncology

## 2019-05-02 ENCOUNTER — Ambulatory Visit: Payer: BC Managed Care – PPO

## 2019-05-04 ENCOUNTER — Inpatient Hospital Stay: Payer: BC Managed Care – PPO

## 2019-05-04 ENCOUNTER — Inpatient Hospital Stay: Payer: BC Managed Care – PPO | Admitting: Oncology

## 2019-05-07 ENCOUNTER — Ambulatory Visit: Payer: BC Managed Care – PPO | Admitting: Oncology

## 2019-05-07 ENCOUNTER — Other Ambulatory Visit: Payer: BC Managed Care – PPO

## 2019-05-09 ENCOUNTER — Other Ambulatory Visit: Payer: BC Managed Care – PPO

## 2019-05-16 ENCOUNTER — Inpatient Hospital Stay: Payer: BC Managed Care – PPO

## 2019-05-16 ENCOUNTER — Inpatient Hospital Stay: Payer: BC Managed Care – PPO | Admitting: Oncology

## 2019-05-22 ENCOUNTER — Other Ambulatory Visit: Payer: Self-pay

## 2019-05-22 ENCOUNTER — Ambulatory Visit
Admission: RE | Admit: 2019-05-22 | Discharge: 2019-05-22 | Disposition: A | Discharge status: Home / Self Care | Payer: BC Managed Care – PPO | Source: Ambulatory Visit | Attending: Oncology | Admitting: Oncology

## 2019-05-22 DIAGNOSIS — C8298 Follicular lymphoma, unspecified, lymph nodes of multiple sites: Secondary | ICD-10-CM | POA: Diagnosis present

## 2019-05-22 DIAGNOSIS — R590 Localized enlarged lymph nodes: Secondary | ICD-10-CM | POA: Diagnosis not present

## 2019-05-22 LAB — GLUCOSE, CAPILLARY: Glucose-Capillary: 90 mg/dL (ref 70–99)

## 2019-05-22 MED ORDER — FLUDEOXYGLUCOSE F - 18 (FDG) INJECTION
6.9200 | Freq: Once | INTRAVENOUS | Status: AC | PRN
  Administered 2019-05-22: 09:00:00 6.92 via INTRAVENOUS

## 2019-05-24 ENCOUNTER — Encounter: Payer: Self-pay | Admitting: Oncology

## 2019-05-28 ENCOUNTER — Other Ambulatory Visit: Payer: Self-pay

## 2019-05-28 ENCOUNTER — Inpatient Hospital Stay: Payer: BC Managed Care – PPO | Attending: Oncology

## 2019-05-28 DIAGNOSIS — C8290 Follicular lymphoma, unspecified, unspecified site: Secondary | ICD-10-CM | POA: Diagnosis present

## 2019-05-28 DIAGNOSIS — K52832 Lymphocytic colitis: Secondary | ICD-10-CM

## 2019-05-28 DIAGNOSIS — C8298 Follicular lymphoma, unspecified, lymph nodes of multiple sites: Secondary | ICD-10-CM

## 2019-05-28 LAB — CBC WITH DIFFERENTIAL/PLATELET
Abs Immature Granulocytes: 0.04 10*3/uL (ref 0.00–0.07)
Basophils Absolute: 0.1 10*3/uL (ref 0.0–0.1)
Basophils Relative: 1 %
Eosinophils Absolute: 0.2 10*3/uL (ref 0.0–0.5)
Eosinophils Relative: 2 %
HCT: 41.1 % (ref 36.0–46.0)
Hemoglobin: 13.3 g/dL (ref 12.0–15.0)
Immature Granulocytes: 1 %
Lymphocytes Relative: 15 %
Lymphs Abs: 1.4 10*3/uL (ref 0.7–4.0)
MCH: 29.4 pg (ref 26.0–34.0)
MCHC: 32.4 g/dL (ref 30.0–36.0)
MCV: 90.7 fL (ref 80.0–100.0)
Monocytes Absolute: 0.6 10*3/uL (ref 0.1–1.0)
Monocytes Relative: 7 %
Neutro Abs: 6.6 10*3/uL (ref 1.7–7.7)
Neutrophils Relative %: 74 %
Platelets: 253 10*3/uL (ref 150–400)
RBC: 4.53 MIL/uL (ref 3.87–5.11)
RDW: 12.6 % (ref 11.5–15.5)
WBC: 8.8 10*3/uL (ref 4.0–10.5)
nRBC: 0 % (ref 0.0–0.2)

## 2019-05-28 LAB — COMPREHENSIVE METABOLIC PANEL
ALT: 18 U/L (ref 0–44)
AST: 20 U/L (ref 15–41)
Albumin: 4.3 g/dL (ref 3.5–5.0)
Alkaline Phosphatase: 50 U/L (ref 38–126)
Anion gap: 10 (ref 5–15)
BUN: 16 mg/dL (ref 6–20)
CO2: 25 mmol/L (ref 22–32)
Calcium: 10.5 mg/dL — ABNORMAL HIGH (ref 8.9–10.3)
Chloride: 102 mmol/L (ref 98–111)
Creatinine, Ser: 0.96 mg/dL (ref 0.44–1.00)
GFR calc Af Amer: >60 mL/min (ref >60)
GFR calc non Af Amer: >60 mL/min (ref >60)
Glucose, Bld: 101 mg/dL — ABNORMAL HIGH (ref 70–99)
Potassium: 4.3 mmol/L (ref 3.5–5.1)
Sodium: 137 mmol/L (ref 135–145)
Total Bilirubin: 0.6 mg/dL (ref 0.3–1.2)
Total Protein: 7.5 g/dL (ref 6.5–8.1)

## 2019-05-28 LAB — LACTATE DEHYDROGENASE: LDH: 129 U/L (ref 98–192)

## 2019-05-29 ENCOUNTER — Other Ambulatory Visit: Payer: BC Managed Care – PPO

## 2019-05-29 ENCOUNTER — Encounter: Payer: Self-pay | Admitting: Oncology

## 2019-05-29 ENCOUNTER — Inpatient Hospital Stay (HOSPITAL_BASED_OUTPATIENT_CLINIC_OR_DEPARTMENT_OTHER): Payer: BC Managed Care – PPO | Admitting: Oncology

## 2019-05-29 DIAGNOSIS — C8298 Follicular lymphoma, unspecified, lymph nodes of multiple sites: Secondary | ICD-10-CM | POA: Diagnosis not present

## 2019-05-29 DIAGNOSIS — D259 Leiomyoma of uterus, unspecified: Secondary | ICD-10-CM | POA: Diagnosis not present

## 2019-05-29 DIAGNOSIS — M2550 Pain in unspecified joint: Secondary | ICD-10-CM | POA: Diagnosis not present

## 2019-05-29 NOTE — Progress Notes (Signed)
HEMATOLOGY-ONCOLOGY TeleHEALTH VISIT PROGRESS NOTE  I connected with Carly Roberts on 05/29/19 at  8:45 AM EST by video enabled telemedicine visit and verified that I am speaking with the correct person using two identifiers. I discussed the limitations, risks, security and privacy concerns of performing an evaluation and management service by telemedicine and the availability of in-person appointments. I also discussed with the patient that there may be a patient responsible charge related to this service. The patient expressed understanding and agreed to proceed.   Other persons participating in the visit and their role in the encounter:  None  Patient's location: Home  Provider's location: office Chief Complaint: follicular lymphoma    INTERVAL HISTORY Carly Roberts is a 54 y.o. female who has above history reviewed by me today presents for follow up visit for management of follicular cell lymphoma Problems and complaints are listed below:  Patient reports multiple joint pain.  She also continues to have right hip pain  chronic fatigue is stable.  Neck pain has improved since she works from home.   Appetite is fair. Denies any unintentional weight loss,   Review of Systems  Constitutional: Positive for fatigue. Negative for appetite change, chills and fever.  HENT:   Negative for hearing loss and voice change.   Eyes: Negative for eye problems.  Respiratory: Negative for chest tightness and cough.   Cardiovascular: Negative for chest pain.  Gastrointestinal: Negative for abdominal distention, abdominal pain and blood in stool.  Endocrine: Negative for hot flashes.  Genitourinary: Negative for difficulty urinating and frequency.   Musculoskeletal: Positive for arthralgias.  Skin: Negative for itching and rash.  Neurological: Negative for extremity weakness.  Hematological: Negative for adenopathy.  Psychiatric/Behavioral: Negative for confusion.    Past Medical  History:  Diagnosis Date  . Anemia   . Anxiety and depression   . Cancer ([place]) 2018   Non Hodgkin's follicular lymphoma  . Chicken pox   . Fibrocystic breast 2011  . Heart murmur   . History of mammogram 05/27/2015   [id number]  . Hypertension   . Lymphocytic colitis 04/2018   Dr. Vira Agar  . Menorrhagia   . Uterine fibroid    Past Surgical History:  Procedure Laterality Date  . ABLATION  2008   endometrial ablation  . BREAST CYST ASPIRATION Bilateral 12/31/2015   FNA done by Dr. Bary Castilla  . BREAST EXCISIONAL BIOPSY Right 2005   Fibroadenoma, duct adenoma, right breast or o'clock.  . COLONOSCOPY  08/25/2011   removed 3 mm polyp Dr. Vira Agar  . COLONOSCOPY  04/2018   lymphocytic colitis  . DILATION AND CURETTAGE OF UTERUS    . HYSTEROSCOPY    . LYMPH NODE BIOPSY  2018  . WISDOM TOOTH EXTRACTION      Family History  Problem Relation Age of Onset  . Multiple sclerosis Mother   . Hypothyroidism Mother   . Diverticulosis Mother        intestinal obstruction  . Hyperlipidemia Father   . Heart disease Father        Had MI 2016  . Hypertension Father   . Skin cancer Father   . Lung cancer Maternal Aunt        77s  . Alcohol abuse Maternal Uncle   . Lung cancer Maternal Uncle        68s  . Cancer Maternal Uncle 60       kidney  . Stroke Maternal Grandmother   . Hypertension Maternal Grandmother   .  Kidney disease Maternal Grandmother        had a kidney removed  . Diabetes Maternal Grandmother   . Hypertension Paternal Grandfather   . Colon cancer Paternal Grandfather   . Autoimmune disease Sister        alopecia   . Prostate cancer Maternal Grandfather   . Colon cancer Maternal Uncle     Social History   Socioeconomic History  . Marital status: Married    Spouse name: Not on file  . Number of children: 2  . Years of education: 54  . Highest education level: Not on file  Occupational History  . Occupation: Armed forces training and education officer  Tobacco Use  . Smoking  status: Never Smoker  . Smokeless tobacco: Never Used  Substance and Sexual Activity  . Alcohol use: Yes    Alcohol/week: 0.0 standard drinks    Comment: rare  . Drug use: No  . Sexual activity: Yes    Partners: Male    Birth control/protection: Surgical    Comment: vasectomy  Other Topics Concern  . Not on file  Social History Narrative  . Not on file   Social Determinants of Health   Financial Resource Strain:   . Difficulty of Paying Living Expenses: Not on file  Food Insecurity:   . Worried About Charity fundraiser in the Last Year: Not on file  . Ran Out of Food in the Last Year: Not on file  Transportation Needs:   . Lack of Transportation (Medical): Not on file  . Lack of Transportation (Non-Medical): Not on file  Physical Activity:   . Days of Exercise per Week: Not on file  . Minutes of Exercise per Session: Not on file  Stress:   . Feeling of Stress : Not on file  Social Connections:   . Frequency of Communication with Friends and Family: Not on file  . Frequency of Social Gatherings with Friends and Family: Not on file  . Attends Religious Services: Not on file  . Active Member of Clubs or Organizations: Not on file  . Attends Archivist Meetings: Not on file  . Marital Status: Not on file  Intimate Partner Violence:   . Fear of Current or Ex-Partner: Not on file  . Emotionally Abused: Not on file  . Physically Abused: Not on file  . Sexually Abused: Not on file    Current Outpatient Medications on File Prior to Visit  Medication Sig Dispense Refill  . Multiple Vitamins-Minerals (HAIR/SKIN/NAILS) CAPS     . ibuprofen (ADVIL,MOTRIN) 200 MG tablet Take 200 mg by mouth every 6 (six) hours as needed.    . pantoprazole (PROTONIX) 20 MG tablet Take 1 tablet by mouth daily.     No current facility-administered medications on file prior to visit.    Allergies  Allergen Reactions  . Merthiolate [Thimerosal] Rash  . Sulfa Antibiotics Rash  .  Sulfasalazine Rash       Observations/Objective: Today's Vitals   05/29/19 0838  PainSc: 0-No pain   There is no height or weight on file to calculate BMI.  Physical Exam  Constitutional: She is oriented to person, place, and time. No distress.  Neurological: She is alert and oriented to person, place, and time.  Psychiatric: Mood normal.    CBC    Component Value Date/Time   WBC 8.8 05/28/2019 1318   RBC 4.53 05/28/2019 1318   HGB 13.3 05/28/2019 1318   HCT 41.1 05/28/2019 1318   PLT 253 05/28/2019  1318   MCV 90.7 05/28/2019 1318   MCH 29.4 05/28/2019 1318   MCHC 32.4 05/28/2019 1318   RDW 12.6 05/28/2019 1318   LYMPHSABS 1.4 05/28/2019 1318   MONOABS 0.6 05/28/2019 1318   EOSABS 0.2 05/28/2019 1318   BASOSABS 0.1 05/28/2019 1318    CMP     Component Value Date/Time   NA 137 05/28/2019 1318   K 4.3 05/28/2019 1318   CL 102 05/28/2019 1318   CO2 25 05/28/2019 1318   GLUCOSE 101 (H) 05/28/2019 1318   BUN 16 05/28/2019 1318   CREATININE 0.96 05/28/2019 1318   CALCIUM 10.5 (H) 05/28/2019 1318   PROT 7.5 05/28/2019 1318   ALBUMIN 4.3 05/28/2019 1318   AST 20 05/28/2019 1318   ALT 18 05/28/2019 1318   ALKPHOS 50 05/28/2019 1318   BILITOT 0.6 05/28/2019 1318   GFRNONAA >60 05/28/2019 1318   GFRAA >60 05/28/2019 1318     Assessment and Plan: 1. Follicular lymphoma of lymph nodes of multiple regions, unspecified follicular lymphoma type ([place])   2. Arthralgia, unspecified joint   3. Hypercalcemia   4. Uterine leiomyoma, unspecified location     #Follicular cell lymphoma, Interval PET scan images were independently reviewed by me and discussed with patient.  PET scan showed showed progression of disease in terms of axillary, retroperitoneal lymphadenopathy and relative stable appearance of the pelvic lymphadenopathy.  No new sites of disease. Labs were reviewed and discussed with patient. Recommend continue watchful waiting.  #Generalized arthralgia, etiology is  unknown.  Patient has a history of lymphocytic colitis.  Advised patient to discuss with primary care provider to see if she needs any rheumatology work-up. MRI right hip showed moderate osteoarthritis #Uterine fibroid, patient has establish care with GYN for further management.  #Hypercalcemia, calcium level is 10.5.  Patient has been taking oral calcium supplementation.  Advised patient to stop calcium supplement for now. # Follow Up Instructions: 6 months   I discussed the assessment and treatment plan with the patient. The patient was provided an opportunity to ask questions and all were answered. The patient agreed with the plan and demonstrated an understanding of the instructions.  The patient was advised to call back or seek an in-person evaluation if the symptoms worsen or if the condition fails to improve as anticipated.     Earlie Server, MD 05/29/2019 2:06 PM

## 2019-05-29 NOTE — Progress Notes (Signed)
Patient contacted for Mychart visit. Patient reports pain to right lower back and is "ready to figure out what it is?"

## 2019-08-01 ENCOUNTER — Other Ambulatory Visit: Payer: Self-pay | Admitting: Obstetrics and Gynecology

## 2019-08-01 DIAGNOSIS — D251 Intramural leiomyoma of uterus: Secondary | ICD-10-CM

## 2019-09-15 HISTORY — PX: UTERINE FIBROID SURGERY: SHX826

## 2019-12-14 ENCOUNTER — Other Ambulatory Visit: Payer: Self-pay | Admitting: Orthopedic Surgery

## 2019-12-14 DIAGNOSIS — M1611 Unilateral primary osteoarthritis, right hip: Secondary | ICD-10-CM

## 2019-12-17 ENCOUNTER — Other Ambulatory Visit: Payer: Self-pay | Admitting: Certified Nurse Midwife

## 2019-12-17 DIAGNOSIS — Z1231 Encounter for screening mammogram for malignant neoplasm of breast: Secondary | ICD-10-CM

## 2019-12-28 ENCOUNTER — Ambulatory Visit: Payer: BC Managed Care – PPO

## 2020-01-11 ENCOUNTER — Other Ambulatory Visit: Payer: Self-pay

## 2020-01-11 ENCOUNTER — Ambulatory Visit
Admission: RE | Admit: 2020-01-11 | Discharge: 2020-01-11 | Disposition: A | Discharge status: Home / Self Care | Payer: BC Managed Care – PPO | Source: Ambulatory Visit | Attending: Orthopedic Surgery | Admitting: Orthopedic Surgery

## 2020-01-11 DIAGNOSIS — M1611 Unilateral primary osteoarthritis, right hip: Secondary | ICD-10-CM

## 2020-01-14 ENCOUNTER — Ambulatory Visit: Payer: BC Managed Care – PPO | Admitting: Obstetrics

## 2020-01-17 ENCOUNTER — Other Ambulatory Visit: Payer: Self-pay

## 2020-01-17 ENCOUNTER — Ambulatory Visit
Admission: RE | Admit: 2020-01-17 | Discharge: 2020-01-17 | Disposition: A | Discharge status: Home / Self Care | Payer: BC Managed Care – PPO | Source: Ambulatory Visit | Attending: Certified Nurse Midwife | Admitting: Certified Nurse Midwife

## 2020-01-17 DIAGNOSIS — Z1231 Encounter for screening mammogram for malignant neoplasm of breast: Secondary | ICD-10-CM | POA: Insufficient documentation

## 2020-01-18 ENCOUNTER — Encounter: Payer: Self-pay | Admitting: Obstetrics and Gynecology

## 2020-02-04 ENCOUNTER — Ambulatory Visit (INDEPENDENT_AMBULATORY_CARE_PROVIDER_SITE_OTHER): Payer: BC Managed Care – PPO | Admitting: Obstetrics

## 2020-02-04 ENCOUNTER — Other Ambulatory Visit: Payer: Self-pay

## 2020-02-04 ENCOUNTER — Encounter: Payer: Self-pay | Admitting: Obstetrics

## 2020-02-04 VITALS — BP 130/80 | HR 52 | Ht 62.0 in | Wt 131.0 lb

## 2020-02-04 DIAGNOSIS — Z124 Encounter for screening for malignant neoplasm of cervix: Secondary | ICD-10-CM

## 2020-02-04 DIAGNOSIS — D251 Intramural leiomyoma of uterus: Secondary | ICD-10-CM | POA: Diagnosis not present

## 2020-02-04 DIAGNOSIS — Z01419 Encounter for gynecological examination (general) (routine) without abnormal findings: Secondary | ICD-10-CM

## 2020-02-04 DIAGNOSIS — D252 Subserosal leiomyoma of uterus: Secondary | ICD-10-CM

## 2020-02-05 ENCOUNTER — Other Ambulatory Visit (HOSPITAL_COMMUNITY)
Admission: RE | Admit: 2020-02-05 | Discharge: 2020-02-05 | Disposition: A | Discharge status: Home / Self Care | Payer: BC Managed Care – PPO | Source: Ambulatory Visit | Attending: Obstetrics | Admitting: Obstetrics

## 2020-02-05 DIAGNOSIS — Z124 Encounter for screening for malignant neoplasm of cervix: Secondary | ICD-10-CM | POA: Diagnosis present

## 2020-02-05 DIAGNOSIS — Z01419 Encounter for gynecological examination (general) (routine) without abnormal findings: Secondary | ICD-10-CM | POA: Insufficient documentation

## 2020-02-05 NOTE — Progress Notes (Signed)
Routine Annual Gynecology Examination   PCP: Rusty Aus, MD  Chief Complaint:  Chief Complaint  Patient presents with  . Gynecologic Exam    History of Present Illness: Patient is a 54 y.o. G2P2002 presents for annual exam. The patient has no complaints today.  She shares that she recently underwent an UFE to address several fibroids that had been evalauted by Dr. Georgianne Fick. She has also had right  Hip pain, and the thought was that perhaps the fibroids were causing the pain. Since the UFE she continues to have pain that is now thought to be osteoarthritis in her right hip. Menopausal bleeding: denies  Menopausal symptoms: denies  Breast symptoms: reports  History of fibrocystic changes and a right breast nodule picked up by mammogram previously (benign) Last pap smear: 1 years ago.  Result Normal  Last mammogram:less than oneyears ago.  Result Normal  Past Medical History:  Diagnosis Date  . Anemia   . Anxiety and depression   . Cancer ([place]) 2018   Non Hodgkin's follicular lymphoma  . Chicken pox   . Fibrocystic breast 2011  . Heart murmur   . History of mammogram 05/27/2015   birads2  . Hypertension   . Lymphocytic colitis 04/2018   Dr. Vira Agar  . Menorrhagia   . Uterine fibroid     Past Surgical History:  Procedure Laterality Date  . ABLATION  2008   endometrial ablation  . BREAST CYST ASPIRATION Bilateral 12/31/2015   FNA done by Dr. Bary Castilla  . BREAST EXCISIONAL BIOPSY Right 2005   Fibroadenoma, duct adenoma, right breast or o'clock.  . COLONOSCOPY  08/25/2011   removed 3 mm polyp Dr. Vira Agar  . COLONOSCOPY  04/2018   lymphocytic colitis  . DILATION AND CURETTAGE OF UTERUS    . HYSTEROSCOPY    . LYMPH NODE BIOPSY  2018  . WISDOM TOOTH EXTRACTION      Prior to Admission medications   Medication Sig Start Date End Date Taking? Authorizing Provider  Multiple Vitamins-Minerals (HAIR/SKIN/NAILS) CAPS  04/17/19  Yes [provider]  nabumetone  (RELAFEN) 500 MG tablet Take 1 tablet by mouth in the morning and at bedtime. 12/04/19 12/03/20 Yes [provider]  pantoprazole (PROTONIX) 20 MG tablet Take 1 tablet by mouth daily. 11/09/18  Yes [provider]    Allergies  Allergen Reactions  . Merthiolate [Thimerosal] Rash  . Sulfa Antibiotics Rash  . Sulfasalazine Rash    Gynecologic History:  No LMP recorded (lmp unknown). Patient has had an ablation. Contraception: vasectomy Last Pap: 2020. Results were: normal Last mammogram: this year. Results were: normal  Obstetric History: G2P2002  Social History   Socioeconomic History  . Marital status: Married    Spouse name: Not on file  . Number of children: 2  . Years of education: 69  . Highest education level: Not on file  Occupational History  . Occupation: Armed forces training and education officer  Tobacco Use  . Smoking status: Never Smoker  . Smokeless tobacco: Never Used  Vaping Use  . Vaping Use: Never used  Substance and Sexual Activity  . Alcohol use: Yes    Alcohol/week: 0.0 standard drinks    Comment: rare  . Drug use: No  . Sexual activity: Yes    Partners: Male    Birth control/protection: Surgical    Comment: vasectomy  Other Topics Concern  . Not on file  Social History Narrative  . Not on file   Social Determinants of Health  Financial Resource Strain:   . Difficulty of Paying Living Expenses: Not on file  Food Insecurity:   . Worried About Charity fundraiser in the Last Year: Not on file  . Ran Out of Food in the Last Year: Not on file  Transportation Needs:   . Lack of Transportation (Medical): Not on file  . Lack of Transportation (Non-Medical): Not on file  Physical Activity:   . Days of Exercise per Week: Not on file  . Minutes of Exercise per Session: Not on file  Stress:   . Feeling of Stress : Not on file  Social Connections:   . Frequency of Communication with Friends and Family: Not on file  . Frequency of Social Gatherings  with Friends and Family: Not on file  . Attends Religious Services: Not on file  . Active Member of Clubs or Organizations: Not on file  . Attends Archivist Meetings: Not on file  . Marital Status: Not on file  Intimate Partner Violence:   . Fear of Current or Ex-Partner: Not on file  . Emotionally Abused: Not on file  . Physically Abused: Not on file  . Sexually Abused: Not on file    Family History  Problem Relation Age of Onset  . Multiple sclerosis Mother   . Hypothyroidism Mother   . Diverticulosis Mother        intestinal obstruction  . Hyperlipidemia Father   . Heart disease Father        Had MI 2016  . Hypertension Father   . Skin cancer Father   . Lung cancer Maternal Aunt        36s  . Alcohol abuse Maternal Uncle   . Lung cancer Maternal Uncle        61s  . Cancer Maternal Uncle 60       kidney  . Stroke Maternal Grandmother   . Hypertension Maternal Grandmother   . Kidney disease Maternal Grandmother        had a kidney removed  . Diabetes Maternal Grandmother   . Hypertension Paternal Grandfather   . Colon cancer Paternal Grandfather   . Autoimmune disease Sister        alopecia   . Prostate cancer Maternal Grandfather   . Colon cancer Maternal Uncle   . Breast cancer Neg Hx     Review of Systems  All other systems reviewed and are negative.    Physical Exam Vitals: BP 130/80   Pulse (!) 52   Ht 5\' 2"  (1.575 m)   Wt 131 lb (59.4 kg)   LMP  (LMP Unknown)   BMI 23.96 kg/m   Physical Exam Constitutional:      Appearance: Normal appearance.  Genitourinary:     Vulva normal.     Genitourinary Comments: Bimanual exam reveals an enlarged uterus with palpable masses already identified as fibroids. Non tender on plapation. No anexal masses noted  HENT:     Head: Normocephalic and atraumatic.  Eyes:     Extraocular Movements: Extraocular movements intact.     Pupils: Pupils are equal, round, and reactive to light.  Cardiovascular:      Rate and Rhythm: Normal rate and regular rhythm.     Heart sounds: Normal heart sounds.  Pulmonary:     Effort: Pulmonary effort is normal.     Breath sounds: Normal breath sounds.  Abdominal:     General: Abdomen is flat. Bowel sounds are normal.     Palpations:  Abdomen is soft.  Musculoskeletal:        General: Normal range of motion.     Cervical back: Normal range of motion and neck supple.  Neurological:     Mental Status: She is alert and oriented to person, place, and time.  Skin:    General: Skin is warm and dry.  Psychiatric:        Behavior: Behavior normal.      Female chaperone present for pelvic and breast  portions of the physical exam  Results: AUDIT Questionnaire (screen for alcoholism): pt declines- does not drink    Assessment and Plan:  54 y.o. G10P2002 female here for routine annual gynecologic examination  Plan: Problem List Items Addressed This Visit      Genitourinary   Uterine fibroid    Other Visit Diagnoses    Women's annual routine gynecological examination    -  Primary      Screening: -- Blood pressure screen normal -- Colonoscopy - due - managed by PCP -- Mammogram - not due -- Weight screening: normal -- Depression screening negative (PHQ-9) -- Nutrition: normal -- cholesterol screening: per PCP -- osteoporosis screening: not due -- tobacco screening: not using -- alcohol screening: AUDIT questionnaire indicates low-risk usage. -- family history of breast cancer screening: done. not at high risk. -- no evidence of domestic violence or intimate partner violence. -- STD screening: gonorrhea/chlamydia NAAT not collected per patient request. -- pap smear collected per ASCCP guidelines -- flu vaccine declined -- HPV vaccination series: has not received - pt refuses   She may opt to follow up with Dr. Georgianne Fick to evaluate the effect of the UFE on the size of her fibroids. RTC in one yeear.   Prentice Docker, MD 02/05/2020 1:47 PM

## 2020-02-06 ENCOUNTER — Other Ambulatory Visit: Payer: Self-pay | Admitting: Orthopedic Surgery

## 2020-02-07 LAB — CYTOLOGY - PAP
Comment: NEGATIVE
Diagnosis: NEGATIVE
High risk HPV: NEGATIVE

## 2020-02-11 ENCOUNTER — Encounter: Payer: Self-pay | Admitting: Obstetrics

## 2020-03-17 ENCOUNTER — Encounter: Payer: Self-pay | Admitting: Oncology

## 2020-03-18 ENCOUNTER — Telehealth: Payer: Self-pay

## 2020-03-18 NOTE — Telephone Encounter (Signed)
Done. Pt will be RTC on 03/20/20 for labs @ 830 see Dr.Yu at 9:00 Time per pt request

## 2020-03-18 NOTE — Telephone Encounter (Signed)
Patient sent a MyChart message with questions for Dr. Tasia Catchings.    Check out at last virtual visit in January was for f/u in 6 months.  She has not had the 6 month f/u.  Please schedule patient for lab/MD-in person and call patient to inform her of appt details.

## 2020-03-20 ENCOUNTER — Inpatient Hospital Stay: Payer: BC Managed Care – PPO | Attending: Oncology

## 2020-03-20 ENCOUNTER — Encounter: Payer: Self-pay | Admitting: Oncology

## 2020-03-20 ENCOUNTER — Inpatient Hospital Stay (HOSPITAL_BASED_OUTPATIENT_CLINIC_OR_DEPARTMENT_OTHER): Payer: BC Managed Care – PPO | Admitting: Oncology

## 2020-03-20 VITALS — BP 126/76 | HR 54 | Temp 98.0°F | Resp 18 | Wt 129.2 lb

## 2020-03-20 DIAGNOSIS — F419 Anxiety disorder, unspecified: Secondary | ICD-10-CM | POA: Insufficient documentation

## 2020-03-20 DIAGNOSIS — M2550 Pain in unspecified joint: Secondary | ICD-10-CM

## 2020-03-20 DIAGNOSIS — R590 Localized enlarged lymph nodes: Secondary | ICD-10-CM | POA: Insufficient documentation

## 2020-03-20 DIAGNOSIS — R5383 Other fatigue: Secondary | ICD-10-CM | POA: Insufficient documentation

## 2020-03-20 DIAGNOSIS — N920 Excessive and frequent menstruation with regular cycle: Secondary | ICD-10-CM | POA: Diagnosis not present

## 2020-03-20 DIAGNOSIS — C8298 Follicular lymphoma, unspecified, lymph nodes of multiple sites: Secondary | ICD-10-CM | POA: Diagnosis not present

## 2020-03-20 DIAGNOSIS — C8228 Follicular lymphoma grade III, unspecified, lymph nodes of multiple sites: Secondary | ICD-10-CM | POA: Diagnosis not present

## 2020-03-20 DIAGNOSIS — D631 Anemia in chronic kidney disease: Secondary | ICD-10-CM | POA: Diagnosis not present

## 2020-03-20 DIAGNOSIS — N1831 Chronic kidney disease, stage 3a: Secondary | ICD-10-CM | POA: Diagnosis not present

## 2020-03-20 DIAGNOSIS — Z801 Family history of malignant neoplasm of trachea, bronchus and lung: Secondary | ICD-10-CM | POA: Insufficient documentation

## 2020-03-20 DIAGNOSIS — N183 Chronic kidney disease, stage 3 unspecified: Secondary | ICD-10-CM | POA: Insufficient documentation

## 2020-03-20 DIAGNOSIS — I129 Hypertensive chronic kidney disease with stage 1 through stage 4 chronic kidney disease, or unspecified chronic kidney disease: Secondary | ICD-10-CM | POA: Diagnosis not present

## 2020-03-20 DIAGNOSIS — R531 Weakness: Secondary | ICD-10-CM | POA: Insufficient documentation

## 2020-03-20 DIAGNOSIS — Z8052 Family history of malignant neoplasm of bladder: Secondary | ICD-10-CM | POA: Diagnosis not present

## 2020-03-20 LAB — CBC WITH DIFFERENTIAL/PLATELET
Abs Immature Granulocytes: 0.02 10*3/uL (ref 0.00–0.07)
Basophils Absolute: 0.1 10*3/uL (ref 0.0–0.1)
Basophils Relative: 1 %
Eosinophils Absolute: 0.2 10*3/uL (ref 0.0–0.5)
Eosinophils Relative: 3 %
HCT: 38.9 % (ref 36.0–46.0)
Hemoglobin: 12.9 g/dL (ref 12.0–15.0)
Immature Granulocytes: 0 %
Lymphocytes Relative: 21 %
Lymphs Abs: 1.1 10*3/uL (ref 0.7–4.0)
MCH: 29.6 pg (ref 26.0–34.0)
MCHC: 33.2 g/dL (ref 30.0–36.0)
MCV: 89.2 fL (ref 80.0–100.0)
Monocytes Absolute: 0.4 10*3/uL (ref 0.1–1.0)
Monocytes Relative: 7 %
Neutro Abs: 3.6 10*3/uL (ref 1.7–7.7)
Neutrophils Relative %: 68 %
Platelets: 241 10*3/uL (ref 150–400)
RBC: 4.36 MIL/uL (ref 3.87–5.11)
RDW: 12.1 % (ref 11.5–15.5)
WBC: 5.3 10*3/uL (ref 4.0–10.5)
nRBC: 0 % (ref 0.0–0.2)

## 2020-03-20 LAB — COMPREHENSIVE METABOLIC PANEL
ALT: 19 U/L (ref 0–44)
AST: 22 U/L (ref 15–41)
Albumin: 4 g/dL (ref 3.5–5.0)
Alkaline Phosphatase: 54 U/L (ref 38–126)
Anion gap: 9 (ref 5–15)
BUN: 17 mg/dL (ref 6–20)
CO2: 26 mmol/L (ref 22–32)
Calcium: 9.5 mg/dL (ref 8.9–10.3)
Chloride: 103 mmol/L (ref 98–111)
Creatinine, Ser: 1.13 mg/dL — ABNORMAL HIGH (ref 0.44–1.00)
GFR, Estimated: 59 mL/min — ABNORMAL LOW (ref >60)
Glucose, Bld: 105 mg/dL — ABNORMAL HIGH (ref 70–99)
Potassium: 4.4 mmol/L (ref 3.5–5.1)
Sodium: 138 mmol/L (ref 135–145)
Total Bilirubin: 0.7 mg/dL (ref 0.3–1.2)
Total Protein: 6.9 g/dL (ref 6.5–8.1)

## 2020-03-20 LAB — LACTATE DEHYDROGENASE: LDH: 147 U/L (ref 98–192)

## 2020-03-20 NOTE — Progress Notes (Signed)
[facility] Follow Up Visit.   Patient Care Team: Rusty Aus, MD as PCP - General (Internal Medicine) Dalia Heading, CNM (Inactive) as Midwife (Certified Nurse Midwife) Bary Castilla, Forest Gleason, MD (General Surgery) Minna Merritts, MD as Consulting Physician (Cardiology)  REASON FOR VISIT Follow up for management of follicular lymphoma.    HISTORY OF PRESENTING ILLNESS: Carly Roberts 54 y.o. female who is referred by Dr.Oaks to [place] for evaluation and management of recently diagnosed follicular lymphoma. patientWas in her usual state of health until recently when she started to notice enlarging left neck mass she went to her primary care physician and ultrasound was done showing a few small lymph nodes. She continues to feel uncomfortableness in her neck and therefore a CT scan was performed. CT neck and chest with contrast on 12/08/2016 showed mild adenopathy in the left supraclavicular region and bilateral axillary/subpectoral small bilateral lower lobe nodules nonspecific. As followed by a PET scan on 12/10/2016 which showed pathologically enlarged Deauville 2 left supraclavicular and left subpectoral and axillary adenopathy in the chest. Pathologically enlarged nodes or retroperitoneal pelvic adenopathy in the abdomen. Normal size Deauville 2 scattered lymph nodes in the neck. Patient had ultrasound guided right inguinal node biopsy on 12/20/2016 and pathology showed follicular lymphoma, grade 1-2. KI 67 showed a low proliferation index 5%. Patient was referred to see me for discussion about treatment plan  # She was evaluate at [facility] by Dr.Grover who recommends repeating image scan. to get an idea of the pace of progression of her disease. She does not otherwise clearly meet GELF criteria except for mild symptoms related to neck lymphadenopathy as well as some mild anxiety related to deferring treatment, which is also a possible indication for treatment. If her disease  burden continues to be relatively low with slow disease pace, it is reasonable to continue watchful waiting vs discussion of single agent rituximab as a low toxicity alternative. We also discussed common side effects associated with rituximab. If her disease burden has increased, another option for treatment would be rituximab-bendamustine which likely would offer a longer remission than single agent rituximab but is also a more aggressive treatment   # Colonoscopy on 04/06/2018 showed lymphocytic colitis and biopsies of the ascending colon and sigmoid.  She was prescribed on Entocort which she feels not helping with her symptoms, so she self discontinued.  INTERVAL HISTORY Patient with history listed above reviewed by me present for follow-up of management of follicular cell lymphoma. Patient reports feeling at baseline. Patient was last seen by me in January 2021.  At that time, PET scan showed stable disease. Patient reports feeling okay today.  She has had experienced worsening of joint aches and hip pain. She is going to have hip surgery in mid November 2021. Denies any unintentional weight loss, night sweats, fever. Wax and wane of her cervical lymphadenopathy.    Review of Systems  Constitutional: Positive for malaise/fatigue. Negative for chills, fever and weight loss.  HENT: Negative for congestion, ear discharge, ear pain, nosebleeds, sinus pain and sore throat.        Neck lymph nodes swelling, wax and wane.   Eyes: Negative for double vision, photophobia, pain, discharge and redness.  Respiratory: Negative for cough, hemoptysis, sputum production, shortness of breath and wheezing.   Cardiovascular: Negative for chest pain, palpitations, orthopnea, claudication and leg swelling.  Gastrointestinal: Negative for abdominal pain, blood in stool, constipation, heartburn, melena, nausea and vomiting.  Genitourinary: Negative for dysuria,  flank pain, frequency and hematuria.   Musculoskeletal: Positive for joint pain. Negative for back pain and myalgias.  Skin: Negative for itching and rash.  Neurological: Negative for dizziness, tingling, tremors, focal weakness, weakness and headaches.  Endo/Heme/Allergies: Negative for environmental allergies. Does not bruise/bleed easily.  Psychiatric/Behavioral: Negative for depression, hallucinations, substance abuse and suicidal ideas. The patient is not nervous/anxious.     MEDICAL HISTORY: Past Medical History:  Diagnosis Date  . Anemia   . Anxiety and depression   . Cancer ([place]) 2018   Non Hodgkin's follicular lymphoma  . Chicken pox   . Fibrocystic breast 2011  . Heart murmur   . History of mammogram 05/27/2015   [id number]  . Hypertension   . Lymphocytic colitis 04/2018   Dr. Vira Agar  . Menorrhagia   . Uterine fibroid     SURGICAL HISTORY: Past Surgical History:  Procedure Laterality Date  . ABLATION  2008   endometrial ablation  . BREAST CYST ASPIRATION Bilateral 12/31/2015   FNA done by Dr. Bary Castilla  . BREAST EXCISIONAL BIOPSY Right 2005   Fibroadenoma, duct adenoma, right breast or o'clock.  . COLONOSCOPY  08/25/2011   removed 3 mm polyp Dr. Vira Agar  . COLONOSCOPY  04/2018   lymphocytic colitis  . DILATION AND CURETTAGE OF UTERUS    . HYSTEROSCOPY    . LYMPH NODE BIOPSY  2018  . WISDOM TOOTH EXTRACTION      SOCIAL HISTORY: Social History   Socioeconomic History  . Marital status: Married    Spouse name: Not on file  . Number of children: 2  . Years of education: 43  . Highest education level: Not on file  Occupational History  . Occupation: Armed forces training and education officer  Tobacco Use  . Smoking status: Never Smoker  . Smokeless tobacco: Never Used  Vaping Use  . Vaping Use: Never used  Substance and Sexual Activity  . Alcohol use: Yes    Alcohol/week: 0.0 standard drinks    Comment: rare  . Drug use: No  . Sexual activity: Yes    Partners: Male    Birth control/protection: Surgical     Comment: vasectomy  Other Topics Concern  . Not on file  Social History Narrative  . Not on file   Social Determinants of Health   Financial Resource Strain:   . Difficulty of Paying Living Expenses: Not on file  Food Insecurity:   . Worried About Charity fundraiser in the Last Year: Not on file  . Ran Out of Food in the Last Year: Not on file  Transportation Needs:   . Lack of Transportation (Medical): Not on file  . Lack of Transportation (Non-Medical): Not on file  Physical Activity:   . Days of Exercise per Week: Not on file  . Minutes of Exercise per Session: Not on file  Stress:   . Feeling of Stress : Not on file  Social Connections:   . Frequency of Communication with Friends and Family: Not on file  . Frequency of Social Gatherings with Friends and Family: Not on file  . Attends Religious Services: Not on file  . Active Member of Clubs or Organizations: Not on file  . Attends Archivist Meetings: Not on file  . Marital Status: Not on file  Intimate Partner Violence:   . Fear of Current or Ex-Partner: Not on file  . Emotionally Abused: Not on file  . Physically Abused: Not on file  . Sexually Abused: Not on file  FAMILY HISTORY Family History  Problem Relation Age of Onset  . Multiple sclerosis Mother   . Hypothyroidism Mother   . Diverticulosis Mother        intestinal obstruction  . Hyperlipidemia Father   . Heart disease Father        Had MI 2016  . Hypertension Father   . Skin cancer Father   . Lung cancer Maternal Aunt        60s  . Alcohol abuse Maternal Uncle   . Lung cancer Maternal Uncle        73s  . Cancer Maternal Uncle 60       kidney  . Stroke Maternal Grandmother   . Hypertension Maternal Grandmother   . Kidney disease Maternal Grandmother        had a kidney removed  . Diabetes Maternal Grandmother   . Hypertension Paternal Grandfather   . Colon cancer Paternal Grandfather   . Autoimmune disease Sister         alopecia   . Prostate cancer Maternal Grandfather   . Colon cancer Maternal Uncle   . Breast cancer Neg Hx     ALLERGIES:  is allergic to merthiolate [thimerosal], sulfa antibiotics, and sulfasalazine.  MEDICATIONS:  Current Outpatient Medications  Medication Sig Dispense Refill  . Cholecalciferol (VITAMIN D3) 50 MCG (2000 UT) TABS Take 2,000 Units by mouth every evening.    . magnesium oxide (MAG-OX) 400 MG tablet Take 400 mg by mouth every evening.    . melatonin 5 MG TABS Take 5 mg by mouth at bedtime as needed (sleep).    . Multiple Vitamins-Minerals (HAIR/SKIN/NAILS) CAPS Take 1 capsule by mouth every evening.     . nabumetone (RELAFEN) 500 MG tablet Take 500 mg by mouth in the morning and at bedtime.     . pantoprazole (PROTONIX) 20 MG tablet Take 20 mg by mouth daily.     . vitamin B-12 (CYANOCOBALAMIN) 1000 MCG tablet Take 1,000 mcg by mouth daily.     No current facility-administered medications for this visit.    PHYSICAL EXAMINATION:  ECOG PERFORMANCE STATUS: 0 - Asymptomatic  Vitals:   03/20/20 0854  BP: 126/76  Pulse: (!) 54  Resp: 18  Temp: 98 F (36.7 C)  SpO2: 99%    Filed Weights   03/20/20 0854  Weight: 129 lb 3.2 oz (58.6 kg)     Physical Exam Constitutional:      General: She is not in acute distress.    Appearance: She is not diaphoretic.  HENT:     Head: Normocephalic and atraumatic.     Nose: Nose normal.     Mouth/Throat:     Pharynx: No oropharyngeal exudate.  Eyes:     General: No scleral icterus.       Left eye: No discharge.     Conjunctiva/sclera: Conjunctivae normal.     Pupils: Pupils are equal, round, and reactive to light.  Neck:     Vascular: No JVD.     Comments:  Small soft cervical lymph nodes bilateral, left supraclavicular node (+), appears more prominent.  Cardiovascular:     Rate and Rhythm: Normal rate and regular rhythm.     Heart sounds: Normal heart sounds. No murmur heard.   Pulmonary:     Effort: Pulmonary  effort is normal. No respiratory distress.     Breath sounds: Normal breath sounds. No wheezing or rales.  Chest:     Chest wall: No tenderness.  Abdominal:  General: Bowel sounds are normal. There is no distension.     Palpations: Abdomen is soft. There is no mass.     Tenderness: There is no abdominal tenderness. There is no rebound.  Musculoskeletal:        General: No tenderness. Normal range of motion.     Cervical back: Normal range of motion and neck supple.  Lymphadenopathy:     Cervical: No cervical adenopathy.  Skin:    General: Skin is warm and dry.     Findings: No erythema or rash.  Neurological:     Mental Status: She is alert and oriented to person, place, and time.     Cranial Nerves: No cranial nerve deficit.     Motor: No abnormal muscle tone.     Coordination: Coordination normal.  Psychiatric:        Mood and Affect: Affect normal.        Judgment: Judgment normal.     LABORATORY DATA: I have personally reviewed the data as listed: CBC    Component Value Date/Time   WBC 5.3 03/20/2020 0833   RBC 4.36 03/20/2020 0833   HGB 12.9 03/20/2020 0833   HCT 38.9 03/20/2020 0833   PLT 241 03/20/2020 0833   MCV 89.2 03/20/2020 0833   MCH 29.6 03/20/2020 0833   MCHC 33.2 03/20/2020 0833   RDW 12.1 03/20/2020 0833   LYMPHSABS 1.1 03/20/2020 0833   MONOABS 0.4 03/20/2020 0833   EOSABS 0.2 03/20/2020 0833   BASOSABS 0.1 03/20/2020 0833   CMP Latest Ref Rng & Units 03/20/2020 05/28/2019 11/01/2018  Glucose 70 - 99 mg/dL 105(H) 101(H) 83  BUN 6 - 20 mg/dL _0 Creatinine 0.44 - 1.00 mg/dL 1.13(H) 0.96 0.99  Sodium 135 - 145 mmol/L 138 137 140  Potassium 3.5 - 5.1 mmol/L 4.4 4.3 4.2  Chloride 98 - 111 mmol/L 103 102 107  CO2 22 - 32 mmol/L _1 Calcium 8.9 - 10.3 mg/dL 9.5 10.5(H) 9.0  Total Protein 6.5 - 8.1 g/dL 6.9 7.5 7.2  Total Bilirubin 0.3 - 1.2 mg/dL 0.7 0.6 0.5  Alkaline Phos 38 - 126 U/L 54 50 47  AST 15 - 41 U/L _2 ALT 0 - 44 U/L  _3 #  Negative hepatitis panel and HIV.   Surgical Pathology 12/20/2016 CASE: [id number]  PATIENT: Doralee LONG  Surgical Pathology Report DIAGNOSIS:  A. LYMPH NODE, RIGHT GROIN; NEEDLE CORE BIOPSY:  - FOLLICULAR LYMPHOMA, GRADE 1-2, SEE COMMENT.   Comment: A sample was sent for flow cytometry ([employer]/[facility], Accession [id number]) with the following interpretation: CD10+ clonal B-cell population detected.  The case was sent to [facility] for consultation. The findings of the external consultant Dr. Angelena Form are incorporated above. This is the comment of the external consultant: Thank you for the opportunity to review this case. HE sections demonstrate small core needle biopsy tissue fragments with a dense lymphoid proliferation with a vaguely follicular architecture. The lymphocytes are composed of small mature-appearing forms. No significant population of large lymphocytes is detected. Immunohistochemical stains are performed and evaluated to further characterize this lymphoid proliferation. CD3 marks T cells and negatively highlights B-cell rich follicles. CD20 marks numerous B-cell rich follicles, as well as numerous interfollicular B cells. The B cells are positive for CD10, BCL 6 and BCL-2. CD5 marks T cells without overt coexpression in B cells, while a cyclin D1 immunostain is negative in lymphocytes. IN86 highlights follicular  dendritic cell mesh works underlying most of the follicles. Ki-67 showed a low proliferation index (5%).  Per report, flow cytometric analysis reveals a CD10 positive clonal B-cell population. For details, see [place] report. The  morphologic and immunophenotypic findings support a diagnosis of follicular lymphoma, grade 1-2, with a follicular pattern in this small submitted sample. Selected slides from this case were reviewed and discussed at our hematopathology consensus conference on 12/23/16.       RADIOGRAPHIC STUDIES: I have personally reviewed the radiological images as listed and agree with the findings in the report CT Neck and chest with contrast 12/08/2016  IMPRESSION: Mild adenopathy in the left supraclavicular region and bilateral axillary/ subpectoral regions. Findings concerning for possible lymphoproliferative disorder.Small bilateral 3 mm lower lobe pulmonary nodules, nonspecific.Recommend attention on follow-up imaging. PET scan 12/10/2016 IMPRESSION: 1. Pathologically enlarged Deauville 4 left supraclavicular and left subpectoral and axillary adenopathy in the chest. Pathologically enlarged total for retroperitoneal and pelvic adenopathy in the abdomen. 2. Upper normal sized Deauville 2 scattered lymph nodes in the neck. 3. Other imaging findings of potential clinical significance: Fibroid uterus. Suspected scarring of the left kidney.  PET 09/05/2017 IMPRESSION: 1. Adenopathy in the neck, chest, and abdomen/pelvis is minimally worsened in size and activity compared to the prior exam, currently primarily Deauville 4 disease. 2. Other imaging findings of potential clinical significance: Old granulomatous disease. Prominent stool throughout the colon favors constipation. Left kidney upper pole scarring. Uterine fibroids.    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 Arthralgia, unspecified joint   3. Anemia in stage 3a chronic kidney disease (HCC)    #Follicular lymphoma,  No significant constitutional symptoms.  Continue watchful waiting.  I recommend annual PET scan will obtain in January 2022.  #Right hip arthritis pending hip  surgery. Most recent PET scan in January 2021 did not show significant bone marrow involvement or hip involvement. Labs reviewed and discussed with patient.  Stable counts. I recommend patient to have aggressive post surgery VTE prophylaxis given her history of advanced stage follicular lymphoma.  cc patient orthopedic surgeon.  Dr. Rudene Christians  #Chronic kidney disease, kidney function has slightly decreased comparing to her baseline.  Likely secondary to chronic NSAID use which she has stopped recently.  Discussed about avoiding nephrotoxins.  I will check multiple myeloma panel at the next visit  Hypercalcemia has resolved. Follow up after PET scan in January Orders Placed This Encounter  Procedures  . NM PET Image Restag (PS) Skull Base To Thigh    Standing Status:   Future    Standing Expiration Date:   03/20/2021    Order Specific Question:   If indicated for the ordered procedure, I authorize the administration of a radiopharmaceutical per Radiology protocol    Answer:   Yes    Order Specific Question:   Is the patient pregnant?    Answer:   No    Order Specific Question:   Preferred imaging location?    Answer:   [facility]  . CBC with Differential/Platelet    Standing Status:   Future    Standing Expiration Date:   03/20/2021  . Comprehensive metabolic panel    Standing Status:   Future  Standing Expiration Date:   03/20/2021  . Lactate dehydrogenase    Standing Status:   Future    Standing Expiration Date:   03/20/2021  . Kappa/lambda light chains    Standing Status:   Future    Standing Expiration Date:   03/20/2021  . Multiple Myeloma Panel (SPEP&IFE w/QIG)    Standing Status:   Future    Standing Expiration Date:   03/20/2021   We spent sufficient time to discuss many aspect of care, questions were answered to patient's satisfaction.    Earlie Server, MD, PhD Hematology Oncology [facility] at [facility] Pager- [phone] 03/20/2020

## 2020-03-20 NOTE — Progress Notes (Signed)
Patient here for follow up. Pt will have right hip replacement surgery on Nov 18.

## 2020-03-21 ENCOUNTER — Other Ambulatory Visit: Payer: Self-pay

## 2020-03-21 ENCOUNTER — Encounter
Admission: RE | Admit: 2020-03-21 | Discharge: 2020-03-21 | Disposition: A | Discharge status: Home / Self Care | Payer: BC Managed Care – PPO | Source: Ambulatory Visit | Attending: Orthopedic Surgery | Admitting: Orthopedic Surgery

## 2020-03-21 DIAGNOSIS — Z01818 Encounter for other preprocedural examination: Secondary | ICD-10-CM | POA: Insufficient documentation

## 2020-03-21 HISTORY — DX: Gastro-esophageal reflux disease without esophagitis: K21.9

## 2020-03-21 LAB — URINALYSIS, ROUTINE W REFLEX MICROSCOPIC
Bilirubin Urine: NEGATIVE
Glucose, UA: NEGATIVE mg/dL
Hgb urine dipstick: NEGATIVE
Ketones, ur: NEGATIVE mg/dL
Leukocytes,Ua: NEGATIVE
Nitrite: NEGATIVE
Protein, ur: NEGATIVE mg/dL
Specific Gravity, Urine: 1.01 (ref 1.005–1.030)
pH: 6 (ref 5.0–8.0)

## 2020-03-21 LAB — SURGICAL PCR SCREEN
MRSA, PCR: NEGATIVE
Staphylococcus aureus: NEGATIVE

## 2020-03-21 NOTE — Patient Instructions (Addendum)
Your procedure is scheduled on: Thurs 11/18 Report to Registration Desk in [place], then to 2nd floor surgery desk. To find out your arrival time please call [phone] between 1PM - 3PM on Friday 11/17.  Remember: Instructions that are not followed completely may result in serious medical risk,  up to and including death, or upon the discretion of your surgeon and anesthesiologist your  surgery may need to be rescheduled.     _X__ 1. Do not eat food after midnight the night before your procedure.                 No chewing gum or hard candies. You may drink clear liquids up to 2 hours                 before you are scheduled to arrive for your surgery- DO not drink clear                 liquids within 2 hours of the start of your surgery.                 Clear Liquids include:  water, apple juice without pulp, clear Gatorade, G2 or                  Gatorade Zero (avoid Red/Purple/Blue), Black Coffee or Tea (Do not add                 anything to coffee or tea). __x___2.   Complete the "Ensure Clear Pre-surgery Clear Carbohydrate Drink" provided to you, 2 hours before arrival.   __X__2.  On the morning of surgery brush your teeth with toothpaste and water, you                may rinse your mouth with mouthwash if you wish.  Do not swallow any toothpaste of mouthwash.     _X__ 3.  No Alcohol for 24 hours before or after surgery.   ___ 4.  Do Not Smoke or use e-cigarettes For 24 Hours Prior to Your Surgery.                 Do not use any chewable tobacco products for at least 6 hours prior to                 Surgery.  ___  5.  Do not use any recreational drugs (marijuana, cocaine, heroin, ecstasy, MDMA or other)                For at least one week prior to your surgery.  Combination of these drugs with anesthesia                May have life threatening results.  ____  6.  Bring all medications with you on the day of surgery if instructed.   __x__  7.   Notify your doctor if there is any change in your medical condition      (cold, fever, infections).     Do not wear jewelry, make-up, hairpins, clips or nail polish. Do not wear lotions, powders, or perfumes. You may wear deodorant. Do not shave 48 hours prior to surgery.  Do not bring valuables to the hospital.    [facility] is not responsible for any belongings or valuables.  Contacts, dentures or bridgework may not be worn into surgery. Leave your suitcase in the car. After surgery it may be brought to your room. For patients admitted to  the hospital, discharge time is determined by your treatment team.   Patients discharged the day of surgery will not be allowed to drive home.   Make arrangements for someone to be with you for the first 24 hours of your Same Day Discharge.    Please read over the following fact sheets that you were given:   Incentive spirometer    __x__ Take these medicines the morning of surgery with A SIP OF WATER:    1. pantoprazole (PROTONIX) 20 MG tablet  Night before and morning of surgery  2.   3.   4.  5.  6.  ____ Fleet Enema (as directed)   __x__ Use CHG Soap (or wipes) as directed  ____ Use Benzoyl Peroxide Gel as instructed  ____ Use inhalers on the day of surgery  ____ Stop metformin 2 days prior to surgery    ____ Take 1/2 of usual insulin dose the night before surgery. No insulin the morning          of surgery.   ____ Stop Coumadin/Plavix/aspirin on   __x__ Stop Anti-inflammatories no nabumetone (RELAFEN) 500 MG tablet ibuprofen aleve or aspirin products after 11/11     May take tylenol   _x___ Stop supplements until after surgery.  melatonin 5 MG TABS 3 days before  ____ Bring C-Pap to the hospital.    If you have any questions regarding your pre-procedure instructions,  Please call Pre-admit Testing at [place]

## 2020-04-01 ENCOUNTER — Other Ambulatory Visit: Payer: Self-pay

## 2020-04-01 ENCOUNTER — Other Ambulatory Visit
Admission: RE | Admit: 2020-04-01 | Discharge: 2020-04-01 | Disposition: A | Discharge status: Home / Self Care | Payer: BC Managed Care – PPO | Source: Ambulatory Visit | Attending: Orthopedic Surgery | Admitting: Orthopedic Surgery

## 2020-04-01 DIAGNOSIS — Z01812 Encounter for preprocedural laboratory examination: Secondary | ICD-10-CM | POA: Insufficient documentation

## 2020-04-01 DIAGNOSIS — Z20822 Contact with and (suspected) exposure to covid-19: Secondary | ICD-10-CM | POA: Insufficient documentation

## 2020-04-02 LAB — TYPE AND SCREEN
ABO/RH(D): O POS
Antibody Screen: NEGATIVE

## 2020-04-02 LAB — SARS CORONAVIRUS 2 (TAT 6-24 HRS): SARS Coronavirus 2: NEGATIVE

## 2020-04-02 MED ORDER — LACTATED RINGERS IV SOLN: INTRAVENOUS | Status: DC

## 2020-04-02 MED ORDER — CEFAZOLIN SODIUM-DEXTROSE 1-4 GM/50ML-% IV SOLN
1.0000 g | INTRAVENOUS | Status: AC
  Administered 2020-04-03: 1 g via INTRAVENOUS

## 2020-04-02 MED ORDER — ORAL CARE MOUTH RINSE: 15.0000 mL | Freq: Once | OROMUCOSAL | Status: AC

## 2020-04-02 MED ORDER — CHLORHEXIDINE GLUCONATE 0.12 % MT SOLN: 15.0000 mL | Freq: Once | OROMUCOSAL | Status: AC

## 2020-04-03 ENCOUNTER — Inpatient Hospital Stay: Payer: BC Managed Care – PPO

## 2020-04-03 ENCOUNTER — Encounter: Payer: Self-pay | Admitting: Orthopedic Surgery

## 2020-04-03 ENCOUNTER — Observation Stay: Payer: BC Managed Care – PPO

## 2020-04-03 ENCOUNTER — Inpatient Hospital Stay: Payer: BC Managed Care – PPO | Admitting: Anesthesiology

## 2020-04-03 ENCOUNTER — Encounter
Admission: RE | Disposition: A | Discharge status: Home Health | Payer: Self-pay | Source: Home / Self Care | Attending: Orthopedic Surgery

## 2020-04-03 ENCOUNTER — Inpatient Hospital Stay
Admission: RE | Admit: 2020-04-03 | Discharge: 2020-04-05 | DRG: 470 | Disposition: A | Discharge status: Home Health | Payer: BC Managed Care – PPO | Attending: Orthopedic Surgery | Admitting: Orthopedic Surgery

## 2020-04-03 ENCOUNTER — Other Ambulatory Visit: Payer: Self-pay

## 2020-04-03 DIAGNOSIS — R339 Retention of urine, unspecified: Secondary | ICD-10-CM | POA: Diagnosis not present

## 2020-04-03 DIAGNOSIS — N6019 Diffuse cystic mastopathy of unspecified breast: Secondary | ICD-10-CM | POA: Diagnosis present

## 2020-04-03 DIAGNOSIS — M1611 Unilateral primary osteoarthritis, right hip: Principal | ICD-10-CM | POA: Diagnosis present

## 2020-04-03 DIAGNOSIS — I10 Essential (primary) hypertension: Secondary | ICD-10-CM | POA: Diagnosis present

## 2020-04-03 DIAGNOSIS — F32A Depression, unspecified: Secondary | ICD-10-CM | POA: Diagnosis present

## 2020-04-03 DIAGNOSIS — M25551 Pain in right hip: Secondary | ICD-10-CM | POA: Diagnosis present

## 2020-04-03 DIAGNOSIS — Z8572 Personal history of non-Hodgkin lymphomas: Secondary | ICD-10-CM | POA: Diagnosis not present

## 2020-04-03 DIAGNOSIS — Z8261 Family history of arthritis: Secondary | ICD-10-CM

## 2020-04-03 DIAGNOSIS — Z8249 Family history of ischemic heart disease and other diseases of the circulatory system: Secondary | ICD-10-CM | POA: Diagnosis not present

## 2020-04-03 DIAGNOSIS — Z888 Allergy status to other drugs, medicaments and biological substances status: Secondary | ICD-10-CM

## 2020-04-03 DIAGNOSIS — R011 Cardiac murmur, unspecified: Secondary | ICD-10-CM | POA: Diagnosis present

## 2020-04-03 DIAGNOSIS — Z96649 Presence of unspecified artificial hip joint: Secondary | ICD-10-CM

## 2020-04-03 DIAGNOSIS — Z9109 Other allergy status, other than to drugs and biological substances: Secondary | ICD-10-CM

## 2020-04-03 DIAGNOSIS — K219 Gastro-esophageal reflux disease without esophagitis: Secondary | ICD-10-CM | POA: Diagnosis present

## 2020-04-03 DIAGNOSIS — F419 Anxiety disorder, unspecified: Secondary | ICD-10-CM | POA: Diagnosis present

## 2020-04-03 DIAGNOSIS — G8918 Other acute postprocedural pain: Secondary | ICD-10-CM

## 2020-04-03 DIAGNOSIS — Z20822 Contact with and (suspected) exposure to covid-19: Secondary | ICD-10-CM | POA: Diagnosis present

## 2020-04-03 DIAGNOSIS — Z79899 Other long term (current) drug therapy: Secondary | ICD-10-CM

## 2020-04-03 DIAGNOSIS — Z419 Encounter for procedure for purposes other than remedying health state, unspecified: Secondary | ICD-10-CM

## 2020-04-03 HISTORY — DX: Unspecified osteoarthritis, unspecified site: M19.90

## 2020-04-03 HISTORY — PX: TOTAL HIP ARTHROPLASTY: SHX124

## 2020-04-03 LAB — POCT PREGNANCY, URINE: Preg Test, Ur: NEGATIVE

## 2020-04-03 LAB — CBC
HCT: 33.2 % — ABNORMAL LOW (ref 36.0–46.0)
Hemoglobin: 10.9 g/dL — ABNORMAL LOW (ref 12.0–15.0)
MCH: 29.5 pg (ref 26.0–34.0)
MCHC: 32.8 g/dL (ref 30.0–36.0)
MCV: 90 fL (ref 80.0–100.0)
Platelets: 197 10*3/uL (ref 150–400)
RBC: 3.69 MIL/uL — ABNORMAL LOW (ref 3.87–5.11)
RDW: 12.3 % (ref 11.5–15.5)
WBC: 11.2 10*3/uL — ABNORMAL HIGH (ref 4.0–10.5)
nRBC: 0 % (ref 0.0–0.2)

## 2020-04-03 LAB — CREATININE, SERUM
Creatinine, Ser: 0.83 mg/dL (ref 0.44–1.00)
GFR, Estimated: >60 mL/min (ref >60)

## 2020-04-03 LAB — ABO/RH: ABO/RH(D): O POS

## 2020-04-03 SURGERY — ARTHROPLASTY, HIP, TOTAL, ANTERIOR APPROACH
Anesthesia: Spinal | Site: Hip | Laterality: Right

## 2020-04-03 MED ORDER — MAGNESIUM CITRATE PO SOLN
1.0000 | Freq: Once | ORAL | Status: DC | PRN
  Filled 2020-04-03: qty 296

## 2020-04-03 MED ORDER — BUPIVACAINE LIPOSOME 1.3 % IJ SUSP
INTRAMUSCULAR | Status: AC
  Filled 2020-04-03: qty 20

## 2020-04-03 MED ORDER — METHOCARBAMOL 1000 MG/10ML IJ SOLN
500.0000 mg | Freq: Four times a day (QID) | INTRAVENOUS | Status: DC | PRN
  Filled 2020-04-03: qty 5

## 2020-04-03 MED ORDER — PHENOL 1.4 % MT LIQD
1.0000 | OROMUCOSAL | Status: DC | PRN
  Filled 2020-04-03: qty 177

## 2020-04-03 MED ORDER — TRAMADOL HCL 50 MG PO TABS
50.0000 mg | ORAL_TABLET | Freq: Four times a day (QID) | ORAL | Status: DC
  Administered 2020-04-03 – 2020-04-05 (×9): 50 mg via ORAL
  Filled 2020-04-03 (×8): qty 1

## 2020-04-03 MED ORDER — MIDAZOLAM HCL 2 MG/2ML IJ SOLN
INTRAMUSCULAR | Status: AC
  Filled 2020-04-03: qty 2

## 2020-04-03 MED ORDER — SODIUM CHLORIDE 0.9 % IV SOLN: INTRAVENOUS | Status: DC

## 2020-04-03 MED ORDER — HYDROCODONE-ACETAMINOPHEN 7.5-325 MG PO TABS
1.0000 | ORAL_TABLET | ORAL | Status: DC | PRN
  Administered 2020-04-03: 2 via ORAL
  Administered 2020-04-03: 1 via ORAL
  Administered 2020-04-04: 2 via ORAL
  Filled 2020-04-03 (×2): qty 2
  Filled 2020-04-03: qty 1

## 2020-04-03 MED ORDER — METOCLOPRAMIDE HCL 5 MG/ML IJ SOLN: 5.0000 mg | Freq: Three times a day (TID) | INTRAMUSCULAR | Status: DC | PRN

## 2020-04-03 MED ORDER — BUPIVACAINE HCL (PF) 0.5 % IJ SOLN
INTRAMUSCULAR | Status: AC
  Filled 2020-04-03: qty 10

## 2020-04-03 MED ORDER — MAGNESIUM HYDROXIDE 400 MG/5ML PO SUSP
30.0000 mL | Freq: Every day | ORAL | Status: DC | PRN
  Administered 2020-04-04: 30 mL via ORAL
  Filled 2020-04-03: qty 30

## 2020-04-03 MED ORDER — LIDOCAINE HCL (PF) 2 % IJ SOLN
INTRAMUSCULAR | Status: AC
  Filled 2020-04-03: qty 5

## 2020-04-03 MED ORDER — FENTANYL CITRATE (PF) 100 MCG/2ML IJ SOLN
INTRAMUSCULAR | Status: AC
  Filled 2020-04-03: qty 2

## 2020-04-03 MED ORDER — BISACODYL 10 MG RE SUPP: 10.0000 mg | Freq: Every day | RECTAL | Status: DC | PRN

## 2020-04-03 MED ORDER — ONDANSETRON HCL 4 MG/2ML IJ SOLN: 4.0000 mg | Freq: Once | INTRAMUSCULAR | Status: DC | PRN

## 2020-04-03 MED ORDER — ONDANSETRON HCL 4 MG/2ML IJ SOLN: 4.0000 mg | Freq: Four times a day (QID) | INTRAMUSCULAR | Status: DC | PRN

## 2020-04-03 MED ORDER — EPHEDRINE SULFATE 50 MG/ML IJ SOLN
INTRAMUSCULAR | Status: DC | PRN
  Administered 2020-04-03: 5 mg via INTRAVENOUS
  Administered 2020-04-03: 10 mg via INTRAVENOUS

## 2020-04-03 MED ORDER — MELATONIN 5 MG PO TABS: 5.0000 mg | ORAL_TABLET | Freq: Every evening | ORAL | Status: DC | PRN

## 2020-04-03 MED ORDER — MAGNESIUM OXIDE 400 (241.3 MG) MG PO TABS
400.0000 mg | ORAL_TABLET | Freq: Every evening | ORAL | Status: DC
  Administered 2020-04-03 – 2020-04-04 (×2): 400 mg via ORAL
  Filled 2020-04-03 (×2): qty 1

## 2020-04-03 MED ORDER — MORPHINE SULFATE (PF) 2 MG/ML IV SOLN
0.5000 mg | INTRAVENOUS | Status: DC | PRN
  Administered 2020-04-03 – 2020-04-04 (×2): 1 mg via INTRAVENOUS
  Filled 2020-04-03 (×2): qty 1

## 2020-04-03 MED ORDER — VITAMIN B-12 1000 MCG PO TABS
1000.0000 ug | ORAL_TABLET | Freq: Every day | ORAL | Status: DC
  Administered 2020-04-03 – 2020-04-05 (×3): 1000 ug via ORAL
  Filled 2020-04-03 (×3): qty 1

## 2020-04-03 MED ORDER — NEOMYCIN-POLYMYXIN B GU 40-200000 IR SOLN
Status: AC
  Filled 2020-04-03: qty 4

## 2020-04-03 MED ORDER — CHLORHEXIDINE GLUCONATE 0.12 % MT SOLN
OROMUCOSAL | Status: AC
  Administered 2020-04-03: 15 mL via OROMUCOSAL
  Filled 2020-04-03: qty 15

## 2020-04-03 MED ORDER — PROPOFOL 10 MG/ML IV BOLUS
INTRAVENOUS | Status: AC
  Filled 2020-04-03: qty 20

## 2020-04-03 MED ORDER — NEOMYCIN-POLYMYXIN B GU 40-200000 IR SOLN
Status: DC | PRN
  Administered 2020-04-03: 4 mL

## 2020-04-03 MED ORDER — TRANEXAMIC ACID-NACL 1000-0.7 MG/100ML-% IV SOLN
INTRAVENOUS | Status: AC
  Filled 2020-04-03: qty 100

## 2020-04-03 MED ORDER — PROPOFOL 500 MG/50ML IV EMUL
INTRAVENOUS | Status: DC | PRN
  Administered 2020-04-03: 35 ug/kg/min via INTRAVENOUS

## 2020-04-03 MED ORDER — DIPHENHYDRAMINE HCL 12.5 MG/5ML PO ELIX: 12.5000 mg | ORAL_SOLUTION | ORAL | Status: DC | PRN

## 2020-04-03 MED ORDER — ENOXAPARIN SODIUM 40 MG/0.4ML ~~LOC~~ SOLN
40.0000 mg | SUBCUTANEOUS | Status: DC
  Administered 2020-04-04 – 2020-04-05 (×2): 40 mg via SUBCUTANEOUS
  Filled 2020-04-03 (×2): qty 0.4

## 2020-04-03 MED ORDER — MIDAZOLAM HCL 5 MG/5ML IJ SOLN
INTRAMUSCULAR | Status: DC | PRN
  Administered 2020-04-03 (×2): 2 mg via INTRAVENOUS

## 2020-04-03 MED ORDER — DOCUSATE SODIUM 100 MG PO CAPS
100.0000 mg | ORAL_CAPSULE | Freq: Two times a day (BID) | ORAL | Status: DC
  Administered 2020-04-03 – 2020-04-05 (×4): 100 mg via ORAL
  Filled 2020-04-03 (×4): qty 1

## 2020-04-03 MED ORDER — FENTANYL CITRATE (PF) 100 MCG/2ML IJ SOLN
INTRAMUSCULAR | Status: DC | PRN
  Administered 2020-04-03 (×2): 50 ug via INTRAVENOUS

## 2020-04-03 MED ORDER — CEFAZOLIN SODIUM-DEXTROSE 1-4 GM/50ML-% IV SOLN
1.0000 g | Freq: Four times a day (QID) | INTRAVENOUS | Status: AC
  Administered 2020-04-03 (×2): 1 g via INTRAVENOUS
  Filled 2020-04-03 (×2): qty 50

## 2020-04-03 MED ORDER — SODIUM CHLORIDE 0.9 % IV SOLN
INTRAVENOUS | Status: DC | PRN
  Administered 2020-04-03: 60 mL

## 2020-04-03 MED ORDER — GLYCOPYRROLATE 0.2 MG/ML IJ SOLN
INTRAMUSCULAR | Status: DC | PRN
  Administered 2020-04-03: .2 mg via INTRAVENOUS

## 2020-04-03 MED ORDER — METHOCARBAMOL 500 MG PO TABS
500.0000 mg | ORAL_TABLET | Freq: Four times a day (QID) | ORAL | Status: DC | PRN
  Administered 2020-04-03 – 2020-04-04 (×3): 500 mg via ORAL
  Filled 2020-04-03 (×3): qty 1

## 2020-04-03 MED ORDER — BUPIVACAINE HCL (PF) 0.5 % IJ SOLN
INTRAMUSCULAR | Status: DC | PRN
  Administered 2020-04-03: 2.6 mL via INTRATHECAL

## 2020-04-03 MED ORDER — HAIR/SKIN/NAILS PO CAPS: 1.0000 | ORAL_CAPSULE | Freq: Every evening | ORAL | Status: DC

## 2020-04-03 MED ORDER — PROPOFOL 10 MG/ML IV BOLUS
INTRAVENOUS | Status: DC | PRN
  Administered 2020-04-03: 20 mg via INTRAVENOUS

## 2020-04-03 MED ORDER — ONDANSETRON HCL 4 MG PO TABS
4.0000 mg | ORAL_TABLET | Freq: Four times a day (QID) | ORAL | Status: DC | PRN
  Administered 2020-04-04: 4 mg via ORAL
  Filled 2020-04-03: qty 1

## 2020-04-03 MED ORDER — SODIUM CHLORIDE FLUSH 0.9 % IV SOLN
INTRAVENOUS | Status: AC
  Filled 2020-04-03: qty 40

## 2020-04-03 MED ORDER — ZOLPIDEM TARTRATE 5 MG PO TABS
5.0000 mg | ORAL_TABLET | Freq: Every evening | ORAL | Status: DC | PRN
  Administered 2020-04-03: 5 mg via ORAL
  Filled 2020-04-03: qty 1

## 2020-04-03 MED ORDER — EPHEDRINE 5 MG/ML INJ
INTRAVENOUS | Status: AC
  Filled 2020-04-03: qty 10

## 2020-04-03 MED ORDER — PHENYLEPHRINE HCL (PRESSORS) 10 MG/ML IV SOLN
INTRAVENOUS | Status: DC | PRN
  Administered 2020-04-03 (×2): 50 ug via INTRAVENOUS
  Administered 2020-04-03 (×4): 100 ug via INTRAVENOUS
  Administered 2020-04-03 (×2): 150 ug via INTRAVENOUS

## 2020-04-03 MED ORDER — HYDROCODONE-ACETAMINOPHEN 5-325 MG PO TABS
1.0000 | ORAL_TABLET | ORAL | Status: DC | PRN
  Administered 2020-04-03 – 2020-04-04 (×2): 1 via ORAL
  Administered 2020-04-04: 2 via ORAL
  Filled 2020-04-03 (×2): qty 1
  Filled 2020-04-03: qty 2
  Filled 2020-04-03: qty 1

## 2020-04-03 MED ORDER — VITAMIN D3 25 MCG (1000 UNIT) PO TABS
2000.0000 [IU] | ORAL_TABLET | Freq: Every evening | ORAL | Status: DC
  Administered 2020-04-03 – 2020-04-04 (×2): 2000 [IU] via ORAL
  Filled 2020-04-03 (×5): qty 2

## 2020-04-03 MED ORDER — METOCLOPRAMIDE HCL 10 MG PO TABS: 5.0000 mg | ORAL_TABLET | Freq: Three times a day (TID) | ORAL | Status: DC | PRN

## 2020-04-03 MED ORDER — TRANEXAMIC ACID-NACL 1000-0.7 MG/100ML-% IV SOLN
1000.0000 mg | Freq: Once | INTRAVENOUS | Status: AC
  Administered 2020-04-03: 1000 mg via INTRAVENOUS

## 2020-04-03 MED ORDER — ACETAMINOPHEN 325 MG PO TABS: 325.0000 mg | ORAL_TABLET | Freq: Four times a day (QID) | ORAL | Status: DC | PRN

## 2020-04-03 MED ORDER — LIDOCAINE HCL (PF) 2 % IJ SOLN
INTRAMUSCULAR | Status: DC | PRN
  Administered 2020-04-03: 50 mg

## 2020-04-03 MED ORDER — BUPIVACAINE-EPINEPHRINE 0.25% -1:200000 IJ SOLN
INTRAMUSCULAR | Status: DC | PRN
  Administered 2020-04-03: 30 mL

## 2020-04-03 MED ORDER — FENTANYL CITRATE (PF) 100 MCG/2ML IJ SOLN
25.0000 ug | INTRAMUSCULAR | Status: DC | PRN
  Administered 2020-04-03: 25 ug via INTRAVENOUS

## 2020-04-03 MED ORDER — CEFAZOLIN SODIUM-DEXTROSE 1-4 GM/50ML-% IV SOLN
INTRAVENOUS | Status: AC
  Filled 2020-04-03: qty 50

## 2020-04-03 MED ORDER — GLYCOPYRROLATE 0.2 MG/ML IJ SOLN
INTRAMUSCULAR | Status: AC
  Filled 2020-04-03: qty 1

## 2020-04-03 MED ORDER — PANTOPRAZOLE SODIUM 20 MG PO TBEC
20.0000 mg | DELAYED_RELEASE_TABLET | Freq: Every day | ORAL | Status: DC
  Administered 2020-04-04 – 2020-04-05 (×2): 20 mg via ORAL
  Filled 2020-04-03 (×2): qty 1

## 2020-04-03 MED ORDER — MENTHOL 3 MG MT LOZG
1.0000 | LOZENGE | OROMUCOSAL | Status: DC | PRN
  Filled 2020-04-03: qty 9

## 2020-04-03 MED ORDER — ALUM & MAG HYDROXIDE-SIMETH 200-200-20 MG/5ML PO SUSP: 30.0000 mL | ORAL | Status: DC | PRN

## 2020-04-03 MED ORDER — BUPIVACAINE-EPINEPHRINE (PF) 0.25% -1:200000 IJ SOLN
INTRAMUSCULAR | Status: AC
  Filled 2020-04-03: qty 30

## 2020-04-03 SURGICAL SUPPLY — 62 items
BLADE SAGITTAL AGGR TOOTH XLG (BLADE) ×2 IMPLANT
BNDG COHESIVE 6X5 TAN STRL LF (GAUZE/BANDAGES/DRESSINGS) ×6 IMPLANT
CANISTER SUCT 1200ML W/VALVE (MISCELLANEOUS) IMPLANT
CANISTER WOUND CARE 500ML ATS (WOUND CARE) ×2 IMPLANT
CHLORAPREP W/TINT 26 (MISCELLANEOUS) ×2 IMPLANT
COVER BACK TABLE REUSABLE LG (DRAPES) ×2 IMPLANT
COVER WAND RF STERILE (DRAPES) ×2 IMPLANT
DRAPE 3/4 80X56 (DRAPES) ×6 IMPLANT
DRAPE C-ARM XRAY 36X54 (DRAPES) ×2 IMPLANT
DRAPE INCISE IOBAN 66X60 STRL (DRAPES) IMPLANT
DRAPE POUCH INSTRU U-SHP 10X18 (DRAPES) ×2 IMPLANT
DRESSING SURGICEL FIBRLLR 1X2 (HEMOSTASIS) ×2 IMPLANT
DRSG MEPILEX SACRM 8.7X9.8 (GAUZE/BANDAGES/DRESSINGS) IMPLANT
DRSG OPSITE POSTOP 4X8 (GAUZE/BANDAGES/DRESSINGS) IMPLANT
DRSG SURGICEL FIBRILLAR 1X2 (HEMOSTASIS) ×4
ELECT BLADE 6.5 EXT (BLADE) ×2 IMPLANT
ELECT REM PT RETURN 9FT ADLT (ELECTROSURGICAL) ×2
ELECTRODE REM PT RTRN 9FT ADLT (ELECTROSURGICAL) ×1 IMPLANT
GLOVE BIOGEL PI IND STRL 9 (GLOVE) ×2 IMPLANT
GLOVE BIOGEL PI INDICATOR 9 (GLOVE) ×2
GLOVE SURG SYN 9.0  PF PI (GLOVE) ×2
GLOVE SURG SYN 9.0 PF PI (GLOVE) ×2 IMPLANT
GOWN SRG 2XL LVL 4 RGLN SLV (GOWNS) ×1 IMPLANT
GOWN STRL NON-REIN 2XL LVL4 (GOWNS) ×1
GOWN STRL REUS W/ TWL LRG LVL3 (GOWN DISPOSABLE) ×1 IMPLANT
GOWN STRL REUS W/TWL LRG LVL3 (GOWN DISPOSABLE) ×1
HEAD FEMORAL 28MM SZ S (Head) ×2 IMPLANT
HEMOVAC 400CC 10FR (MISCELLANEOUS) IMPLANT
HOLDER FOLEY CATH W/STRAP (MISCELLANEOUS) ×2 IMPLANT
HOOD PEEL AWAY FLYTE STAYCOOL (MISCELLANEOUS) ×2 IMPLANT
IRRIGATION SURGIPHOR STRL (IV SOLUTION) IMPLANT
KIT PREVENA INCISION MGT 13 (CANNISTER) ×2 IMPLANT
LINER DUAL MOB 50MM (Liner) ×2 IMPLANT
MANIFOLD NEPTUNE II (INSTRUMENTS) ×2 IMPLANT
MASTERLOC HIP LATERAL S2 (Hips) ×2 IMPLANT
MAT ABSORB  FLUID 56X50 GRAY (MISCELLANEOUS) ×1
MAT ABSORB FLUID 56X50 GRAY (MISCELLANEOUS) ×1 IMPLANT
NDL SAFETY ECLIPSE 18X1.5 (NEEDLE) ×1 IMPLANT
NEEDLE HYPO 18GX1.5 SHARP (NEEDLE) ×1
NEEDLE SPNL 20GX3.5 QUINCKE YW (NEEDLE) ×4 IMPLANT
NS IRRIG 1000ML POUR BTL (IV SOLUTION) ×2 IMPLANT
PACK HIP COMPR (MISCELLANEOUS) ×2 IMPLANT
SCALPEL PROTECTED #10 DISP (BLADE) ×4 IMPLANT
SHELL ACETABULAR SZ0 50 DME (Shell) ×2 IMPLANT
SOL PREP PVP 2OZ (MISCELLANEOUS) ×2
SOLUTION PREP PVP 2OZ (MISCELLANEOUS) ×1 IMPLANT
SPONGE DRAIN TRACH 4X4 STRL 2S (GAUZE/BANDAGES/DRESSINGS) IMPLANT
STAPLER SKIN PROX 35W (STAPLE) ×2 IMPLANT
STRAP SAFETY 5IN WIDE (MISCELLANEOUS) ×2 IMPLANT
SUT DVC 2 QUILL PDO  T11 36X36 (SUTURE) ×1
SUT DVC 2 QUILL PDO T11 36X36 (SUTURE) ×1 IMPLANT
SUT SILK 0 (SUTURE) ×1
SUT SILK 0 30XBRD TIE 6 (SUTURE) ×1 IMPLANT
SUT V-LOC 90 ABS DVC 3-0 CL (SUTURE) ×2 IMPLANT
SUT VIC AB 1 CT1 36 (SUTURE) ×2 IMPLANT
SYR 20ML LL LF (SYRINGE) ×2 IMPLANT
SYR 30ML LL (SYRINGE) ×2 IMPLANT
SYR 50ML LL SCALE MARK (SYRINGE) ×4 IMPLANT
SYR BULB IRRIG 60ML STRL (SYRINGE) ×2 IMPLANT
TAPE MICROFOAM 4IN (TAPE) IMPLANT
TOWEL OR 17X26 4PK STRL BLUE (TOWEL DISPOSABLE) ×2 IMPLANT
TRAY FOLEY MTR SLVR 16FR STAT (SET/KITS/TRAYS/PACK) ×2 IMPLANT

## 2020-04-03 NOTE — Anesthesia Preprocedure Evaluation (Signed)
Anesthesia Evaluation  Patient identified by MRN, date of birth, ID band Patient awake    Reviewed: Allergy & Precautions, NPO status , Patient's Chart, lab work & pertinent test results  History of Anesthesia Complications Negative for: history of anesthetic complications  Airway Mallampati: II       Dental   Pulmonary neg sleep apnea, neg COPD, Not current smoker,           Cardiovascular (-) hypertension(-) Past MI and (-) CHF (-) dysrhythmias + Valvular Problems/Murmurs (as a child, no problems)      Neuro/Psych neg Seizures Anxiety Depression    GI/Hepatic Neg liver ROS, GERD  Medicated and Controlled,  Endo/Other  neg diabetes  Renal/GU negative Renal ROS     Musculoskeletal   Abdominal   Peds  Hematology  (+) anemia ,   Anesthesia Other Findings   Reproductive/Obstetrics                             Anesthesia Physical Anesthesia Plan  ASA: II  Anesthesia Plan: Spinal   Post-op Pain Management:    Induction:   PONV Risk Score and Plan:   Airway Management Planned: Nasal Cannula  Additional Equipment:   Intra-op Plan:   Post-operative Plan:   Informed Consent: I have reviewed the patients History and Physical, chart, labs and discussed the procedure including the risks, benefits and alternatives for the proposed anesthesia with the patient or authorized representative who has indicated his/her understanding and acceptance.       Plan Discussed with:   Anesthesia Plan Comments:         Anesthesia Quick Evaluation

## 2020-04-03 NOTE — H&P (Signed)
Chief Complaint  Patient presents with  . Hip Pain  OA, Rt THA scheduled for 04/03/20    History of the Present Illness: Carly Roberts is a 54 y.o. female here today for history and physical for right total hip arthroplasty with Dr. Hessie Knows on 04/03/2020. She has an MRI of her right hip showing progression of her arthritis with a large cyst in the femoral head consistent with degenerative cyst with progressive full-thickness cartilage loss along the anterior aspect of the hip with chronic degenerative changes to the superior labrum. Patient notes she has a limited activity level due to her pain. The patient reports she is unable to apply weight on her left leg when ascending and descending stairs. She has a hard time with activities involving hip flexion such as getting in and out of a low vehicle, picking things up off the floor and flexing her hip up to put on socks and shoes. With walking, patient will ache and throb at nighttime. Most of the pain is on the lateral hip, groin and thigh. She has been taking anti-inflammatory medications with little relief. Patient has seen Dr. Rudene Christians, discussed total hip arthroplasty and has agreed and consented to procedure  I have reviewed past medical, surgical, social and family history, and allergies as documented in the EMR.  Past Medical History: Past Medical History:  Diagnosis Date  . Allergy  sulfa drugs  . Anemia 01/31/2020  . Anxiety  . Arthritis 2019  Right hip and wrist  . Bradycardia  . 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 GERD (gastroesophageal reflux disease) 2019  Take pantoprazole  . History of abnormal cervical Pap smear  . Hypertensive disorder 01/31/2020  . Murmur, unspecified   Past Surgical History: Past Surgical History:  Procedure Laterality Date  . ASPIRATION CYST BREAST Bilateral 2017  . 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  EGD 04/06/2018  Gastritis: No repeat  . lymphnode biopsy 11/2016  right groin  . MASTECTOMY PARTIAL / LUMPECTOMY Right 2006  . UFB 2021   Past Family History: Family History  Problem Relation Age of Onset  . Multiple sclerosis Mother  . Myocardial Infarction (Heart attack) Father  . Rheum arthritis Sister   Medications: Current Outpatient Medications Ordered in Epic  Medication Sig Dispense Refill  . acetaminophen (TYLENOL) 500 MG tablet Take by mouth  . pantoprazole (PROTONIX) 20 MG DR tablet TAKE 1 TABLET (20 MG TOTAL) BY MOUTH ONCE DAILY TAKE 30 MINUTES BEFORE BREAKFAST. 90 tablet 0  . aspirin/sod bicarb/citric acid (ALKA-SELTZER ORAL) Take by mouth (Patient not taking: Reported on 04/01/2020 )  . ibuprofen (MOTRIN) 200 MG tablet Take 2 tablets by mouth every 6 (six) hours (Patient not taking: Reported on 04/01/2020 )  . melatonin 5 mg Cap Take by mouth (Patient not taking: Reported on 04/01/2020 )  . nabumetone (RELAFEN) 500 MG tablet Take 1 tablet (500 mg total) by mouth 2 (two) times daily (Patient not taking: Reported on 04/01/2020 ) 60 tablet 3   No current Epic-ordered facility-administered medications on file.   Allergies: Allergies  Allergen Reactions  . Acrylates/Beheneth-25 Methacrylate Copolymer Itching and Rash  . Sulfasalazine Rash  . Thimerosal Rash    Body mass index is 23.81 kg/m.  Review of Systems: A comprehensive 14 point ROS was performed, reviewed, and the pertinent orthopaedic findings are documented in the HPI.  Vitals:  04/01/20 1329  BP: 110/66  General Physical Examination:  General:  Well developed, well nourished, no apparent distress, normal affect, normal gait with no antalgic component. No assistive devices.  HEENT: Head normocephalic, atraumatic, PERRL.   Abdomen: Soft, non tender, non distended, Bowel sounds present.  Heart: Examination of the heart reveals regular, rate, and rhythm. There is no murmur noted on ascultation.  There is a normal apical pulse.  Lungs: Lungs are clear to auscultation. There is no wheeze, rhonchi, or crackles. There is normal expansion of bilateral chest walls.   Musculoskeletal Examination: On exam, the patient has right hip internal rotation to 15 degrees, external to 30 degrees pain in both directions. Pain with log roll.   Radiographs: Reviewed MRI obtained on 01/13/2020, which revealed some progression of her arthritis, large cyst in the head that is consistent with a degenerative cyst, progressive full-thickness cartilage loss to the anterior superior aspect of the hip, chronic degenerative changes to the superior labrum, along with evidence the tendon is intact. There is evidence of mild degenerative disc at L5-S1.   Assessment: ICD-10-CM  1. Primary osteoarthritis of right hip M16.11   Plan: 1. Risks, benefits, complications of a right total hip arthroplasty have been discussed with the patient. Patient has agreed and consented procedure with Dr. Hessie Knows on 04/03/2020.    Electronically signed by Feliberto Gottron, PA at 04/01/2020 3:13 PM EST  Reviewed  H+P. No changes noted.

## 2020-04-03 NOTE — Transfer of Care (Signed)
Immediate Anesthesia Transfer of Care Note  Patient: Carly Roberts  Procedure(s) Performed: TOTAL HIP ARTHROPLASTY ANTERIOR APPROACH (Right Hip)  Patient Location: PACU  Anesthesia Type:Spinal  Level of Consciousness: awake, alert  and oriented  Airway & Oxygen Therapy: Patient Spontanous Breathing  Post-op Assessment: Report given to RN and Post -op Vital signs reviewed and stable  Post vital signs: Reviewed  Last Vitals:  Vitals Value Taken Time  BP    Temp    Pulse 58 04/03/20 0904  Resp 9 04/03/20 0904  SpO2 100 % 04/03/20 0904  Vitals shown include unvalidated device data.  Last Pain:  Vitals:   04/03/20 0622  TempSrc: Oral  PainSc: 0-No pain         Complications: No complications documented.

## 2020-04-03 NOTE — Evaluation (Signed)
Physical Therapy Evaluation Patient Details Name: Carly Roberts MRN: [id number] DOB: Mar 15, 1966 Today's Date: 04/03/2020   History of Present Illness  Pt is a 54  yo female s/p R THA, WBAT. PMH of anxiety, depression.    Clinical Impression  Pt alert, agreeable to PT. Reported 7-9/10 pain in R hip, but willing to attempt to work with PT. She reported that she is independent at baseline, no falls to report.   The patient was able to perform supine exercises, physical assist provided for RLE as needed due to pain. Supine <> sit minA for RLE management (and when returning to supine to assist with trunk control). Once in sitting the patient demonstrated need for bilateral UE support. Prior to standing the patient did not complain of lightheadedness/dizziness, but once in standing with RW and CGA, she exhibited pallor and endorsed these symptoms. After returning to sitting BP 89/41, further mobility held. Once returned to supine BP 114/54 in NAD.  Overall the patient demonstrated deficits (see "PT Problem List") that impede the patient's functional abilities, safety, and mobility and would benefit from skilled PT intervention. Recommendation is HHPT pending pt progress with mobility.     Follow Up Recommendations Home health PT;Supervision/Assistance - 24 hour    Equipment Recommendations  Rolling walker with 5" wheels    Recommendations for Other Services       Precautions / Restrictions Precautions Precautions: Anterior Hip Precaution Booklet Issued: Yes (comment) Restrictions Weight Bearing Restrictions: Yes RLE Weight Bearing: Weight bearing as tolerated      Mobility  Bed Mobility Overal bed mobility: Needs Assistance Bed Mobility: Supine to Sit;Sit to Supine     Supine to sit: Min assist Sit to supine: Min assist   General bed mobility comments: assistance for RLE    Transfers Overall transfer level: Needs assistance Equipment used: Rolling walker (2  wheeled) Transfers: Sit to/from Stand Sit to Stand: Min guard;From elevated surface         General transfer comment: extended time needed  Ambulation/Gait             General Gait Details: deferred due to light headedness and orthostatic  Stairs            Wheelchair Mobility    Modified Rankin (Stroke Patients Only)       Balance Overall balance assessment: Needs assistance Sitting-balance support: Feet supported Sitting balance-Leahy Scale: Poor Sitting balance - Comments: reliant on UE support     Standing balance-Leahy Scale: Poor Standing balance comment: reliant on UE support                             Pertinent Vitals/Pain Pain Assessment: 0-10 Pain Score:  (7-9) Pain Descriptors / Indicators: Aching;Sore Pain Intervention(s): Limited activity within patient's tolerance;Monitored during session;Repositioned;Premedicated before session;Patient requesting pain meds-RN notified    Home Living Family/patient expects to be discharged to:: Private residence Living Arrangements: Spouse/significant other;Children Available Help at Discharge: Family;Available 24 hours/day Type of Home: House Home Access: Stairs to enter Entrance Stairs-Rails: None Entrance Stairs-Number of Steps: 5 + 4 Home Layout: Two level Home Equipment: Shower seat - built in      Prior Function Level of Independence: Independent               Hand Dominance        Extremity/Trunk Assessment   Upper Extremity Assessment Upper Extremity Assessment: Overall WFL for tasks assessed    Lower Extremity  Assessment Lower Extremity Assessment: RLE deficits/detail;LLE deficits/detail RLE Deficits / Details: s/p R THA LLE Deficits / Details: WFLs    Cervical / Trunk Assessment Cervical / Trunk Assessment: Normal  Communication   Communication: No difficulties  Cognition Arousal/Alertness: Awake/alert Behavior During Therapy: WFL for tasks  assessed/performed Overall Cognitive Status: Within Functional Limits for tasks assessed                                        General Comments      Exercises Total Joint Exercises Ankle Circles/Pumps: AROM;Strengthening;Both;10 reps Quad Sets: AROM;Strengthening;Both;10 reps Gluteal Sets: AROM;Strengthening;Both;10 reps Heel Slides: AAROM;Strengthening;10 reps;Right Hip ABduction/ADduction: AAROM;Strengthening;Right;10 reps Other Exercises Other Exercises: with mobility pt reported light headedness, BP assessed in sitting as 89/41, further mobility deferred due to this and patient symptoms   Assessment/Plan    PT Assessment Patient needs continued PT services  PT Problem List Decreased strength;Decreased mobility;Decreased knowledge of precautions;Decreased activity tolerance;Decreased balance;Decreased knowledge of use of DME;Pain       PT Treatment Interventions DME instruction;Therapeutic exercise;Gait training;Balance training;Stair training;Neuromuscular re-education;Therapeutic activities;Patient/family education;Functional mobility training    PT Goals (Current goals can be found in the Care Plan section)  Acute Rehab PT Goals Patient Stated Goal: to decrease pain PT Goal Formulation: With patient Time For Goal Achievement: 04/17/20 Potential to Achieve Goals: Good    Frequency BID   Barriers to discharge        Co-evaluation               AM-PAC PT "6 Clicks" Mobility  Outcome Measure Help needed turning from your back to your side while in a flat bed without using bedrails?: A Little Help needed moving from lying on your back to sitting on the side of a flat bed without using bedrails?: A Little Help needed moving to and from a bed to a chair (including a wheelchair)?: A Little Help needed standing up from a chair using your arms (e.g., wheelchair or bedside chair)?: A Little Help needed to walk in hospital room?: A Little Help needed  climbing 3-5 steps with a railing? : A Lot 6 Click Score: 17    End of Session Equipment Utilized During Treatment: Gait belt Activity Tolerance: Patient limited by fatigue;Other (comment) (limited by BP) Patient left: in bed;with call bell/phone within reach;with bed alarm set;with SCD's reapplied;with family/visitor present Nurse Communication: Mobility status PT Visit Diagnosis: Other abnormalities of gait and mobility (R26.89);Muscle weakness (generalized) (M62.81);Difficulty in walking, not elsewhere classified (R26.2);Pain Pain - Right/Left: Right Pain - part of body: Hip    Time: 0802-2336 PT Time Calculation (min) (ACUTE ONLY): 36 min   Charges:   PT Evaluation $PT Eval Low Complexity: 1 Low PT Treatments $Therapeutic Exercise: 23-37 mins       Lieutenant Diego PT, DPT 4:07 PM,04/03/20

## 2020-04-03 NOTE — Progress Notes (Signed)
PHARMACIST - PHYSICIAN ORDER COMMUNICATION  CONCERNING: P&T Medication Policy on Herbal Medications  DESCRIPTION:  This patient's order for:  Hair/skin/nails vitamin  has been noted.  This product(s) is classified as an "herbal" or natural product. Due to a lack of definitive safety studies or FDA approval, nonstandard manufacturing practices, plus the potential risk of unknown drug-drug interactions while on inpatient medications, the Pharmacy and Therapeutics Committee does not permit the use of "herbal" or natural products of this type within [facility].   ACTION TAKEN: The pharmacy department is unable to verify this order at this time.  Please reevaluate patient's clinical condition at discharge and address if the herbal or natural product(s) should be resumed at that time.

## 2020-04-03 NOTE — Op Note (Signed)
04/03/2020  9:07 AM  PATIENT:  Carly Roberts  54 y.o. female  PRE-OPERATIVE DIAGNOSIS:  Primary osteoarthritis of right hip M16.11  POST-OPERATIVE DIAGNOSIS:  Primary osteoarthritis of right hip M16.11  PROCEDURE:  Procedure(s): TOTAL HIP ARTHROPLASTY ANTERIOR APPROACH (Right)  SURGEON: Laurene Footman, MD  ASSISTANTS: None  ANESTHESIA:   spinal  EBL:  Total I/O In: 850 [I.V.:800; IV Piggyback:50] Out: 300 [Urine:150; Blood:150]  BLOOD ADMINISTERED:none  DRAINS: Incisional wound VAC   LOCAL MEDICATIONS USED:  MARCAINE    and OTHER Exparel  SPECIMEN:  Source of Specimen:  Right femoral head  DISPOSITION OF SPECIMEN:  PATHOLOGY  COUNTS:  YES  TOURNIQUET:  * No tourniquets in log *  IMPLANTS: Medacta Masterloc 2 lateralized stem with 50 mm Mpact DM cup and liner with ceramic S 28 mm head  DICTATION: .Dragon Dictation   The patient was brought to the operating room and after spinal anesthesia was obtained patient was placed on the operative table with the ipsilateral foot into the Medacta attachment, contralateral leg on a well-padded table. C-arm was brought in and preop template x-ray taken. After prepping and draping in usual sterile fashion appropriate patient identification and timeout procedures were completed. Anterior approach to the hip was obtained and centered over the greater trochanter and TFL muscle. The subcutaneous tissue was incised hemostasis being achieved by electrocautery. TFL fascia was incised and the muscle retracted laterally deep retractor placed. The lateral femoral circumflex vessels were identified and ligated. The anterior capsule was exposed and a capsulotomy performed. The neck was identified and a femoral neck cut carried out with a saw. The head was removed without difficulty and showed sclerotic femoral head and acetabulum. Reaming was carried out to 48 mm and a 50 mm cup trial gave appropriate tightness to the acetabular component a 50 DM cup  was impacted into position. The leg was then externally rotated and ischiofemoral and pubofemoral releases carried out. The femur was sequentially broached to a size 2, size 2 lateralized stem with S head trials were placed and the final components chosen. The 2 lateralized Masterloc stem was inserted along with a ceramic S 28 mm head and 50 mm liner. The hip was reduced and was stable the wound was thoroughly irrigated with fibrillar placed along the posterior capsule and medial neck. The deep fascia ws closed using a heavy Quill after infiltration of 30 cc of quarter percent Sensorcaine with epinephrine with Exparel was injected throughout the case, .3-0 V-loc to close the skin with skin staples.  Incisional wound VAC applied and patient was sent to recovery in stable condition.   PLAN OF CARE: Admit to inpatient

## 2020-04-03 NOTE — Anesthesia Procedure Notes (Signed)
Spinal  Patient location during procedure: OR Staffing Performed: resident/CRNA  Anesthesiologist: Gunnar Fusi, MD Resident/CRNA: Rolla Plate, CRNA Preanesthetic Checklist Completed: patient identified, IV checked, site marked, risks and benefits discussed, surgical consent, monitors and equipment checked, pre-op evaluation and timeout performed Spinal Block Patient position: sitting Prep: ChloraPrep and site prepped and draped Patient monitoring: heart rate, continuous pulse ox, blood pressure and cardiac monitor Approach: midline Location: L4-5 Injection technique: single-shot Needle Needle type: Whitacre and Introducer  Needle gauge: 24 G Needle length: 9 cm Additional Notes Negative paresthesia. Negative blood return. Positive free-flowing CSF. Expiration date of kit checked and confirmed. Patient tolerated procedure well, without complications.

## 2020-04-04 ENCOUNTER — Encounter: Payer: Self-pay | Admitting: Orthopedic Surgery

## 2020-04-04 LAB — BASIC METABOLIC PANEL
Anion gap: 7 (ref 5–15)
BUN: 10 mg/dL (ref 6–20)
CO2: 25 mmol/L (ref 22–32)
Calcium: 8.5 mg/dL — ABNORMAL LOW (ref 8.9–10.3)
Chloride: 104 mmol/L (ref 98–111)
Creatinine, Ser: 0.8 mg/dL (ref 0.44–1.00)
GFR, Estimated: >60 mL/min (ref >60)
Glucose, Bld: 106 mg/dL — ABNORMAL HIGH (ref 70–99)
Potassium: 3.8 mmol/L (ref 3.5–5.1)
Sodium: 136 mmol/L (ref 135–145)

## 2020-04-04 LAB — CBC
HCT: 30.4 % — ABNORMAL LOW (ref 36.0–46.0)
Hemoglobin: 10 g/dL — ABNORMAL LOW (ref 12.0–15.0)
MCH: 29.7 pg (ref 26.0–34.0)
MCHC: 32.9 g/dL (ref 30.0–36.0)
MCV: 90.2 fL (ref 80.0–100.0)
Platelets: 195 10*3/uL (ref 150–400)
RBC: 3.37 MIL/uL — ABNORMAL LOW (ref 3.87–5.11)
RDW: 12.3 % (ref 11.5–15.5)
WBC: 7.5 10*3/uL (ref 4.0–10.5)
nRBC: 0 % (ref 0.0–0.2)

## 2020-04-04 MED ORDER — HYDROCODONE-ACETAMINOPHEN 5-325 MG PO TABS
1.0000 | ORAL_TABLET | ORAL | 0 refills | Status: DC | PRN
Start: 2020-04-04 — End: 2021-08-19

## 2020-04-04 MED ORDER — ENOXAPARIN SODIUM 40 MG/0.4ML ~~LOC~~ SOLN: 40.0000 mg | SUBCUTANEOUS | 0 refills | Status: DC

## 2020-04-04 MED ORDER — SODIUM CHLORIDE 0.9 % IV BOLUS
500.0000 mL | Freq: Once | INTRAVENOUS | Status: AC
  Administered 2020-04-04: 500 mL via INTRAVENOUS

## 2020-04-04 MED ORDER — TRAMADOL HCL 50 MG PO TABS: 50.0000 mg | ORAL_TABLET | Freq: Four times a day (QID) | ORAL | 0 refills | Status: DC | PRN

## 2020-04-04 MED ORDER — DOCUSATE SODIUM 100 MG PO CAPS: 100.0000 mg | ORAL_CAPSULE | Freq: Two times a day (BID) | ORAL | 0 refills | Status: DC

## 2020-04-04 NOTE — Anesthesia Postprocedure Evaluation (Signed)
Anesthesia Post Note  Patient: Carly Roberts  Procedure(s) Performed: TOTAL HIP ARTHROPLASTY ANTERIOR APPROACH (Right Hip)  Patient location during evaluation: Nursing Unit Anesthesia Type: Spinal Level of consciousness: oriented and awake and alert Pain management: pain level controlled Vital Signs Assessment: post-procedure vital signs reviewed and stable Respiratory status: spontaneous breathing, respiratory function stable and patient connected to nasal cannula oxygen Cardiovascular status: blood pressure returned to baseline and stable Postop Assessment: no headache, no backache and no apparent nausea or vomiting Anesthetic complications: no   No complications documented.   Last Vitals:  Vitals:   04/04/20 0424 04/04/20 0729  BP: (!) 108/50 (!) 109/59  Pulse: 63 75  Resp: 15 16  Temp: 37.1 C 37.1 C  SpO2: 94% 96%    Last Pain:  Vitals:   04/04/20 0729  TempSrc: Oral  PainSc:                  Estill Batten

## 2020-04-04 NOTE — Evaluation (Signed)
Occupational Therapy Evaluation Patient Details Name: Carly Roberts MRN: [id number] DOB: 10/16/1965 Today's Date: 04/04/2020    History of Present Illness Pt is a 54  yo female s/p R THA, WBAT. PMH of anxiety, depression.   Clinical Impression   Carly Roberts was seen for OT evaluation this date, POD#1 from above surgery. Pt was active and independent in all ADLs prior to surgery. Pt lives with her spouse and adult daughter in a 1 level ranch-style home with a basement and ~9 steps to enter the main level. Pt is eager to return to PLOF with less pain and improved safety and independence. Pt currently requires minimal assist for LB dressing and bathing while in seated position due to pain and limited AROM of R hip. Pt instructed in self care skills, falls prevention strategies, home/routines modifications, DME/AE for LB bathing and dressing tasks, pet management strategies and compression stocking mgt strategies. Pt would benefit from additional instruction in self care skills and techniques to help maintain precautions with or without assistive devices to support recall and carryover prior to discharge. Recommend HHOT upon discharge.    Follow Up Recommendations  Home health OT;Supervision/Assistance - 24 hour    Equipment Recommendations  None recommended by OT (Pt has necessary equipment.)    Recommendations for Other Services       Precautions / Restrictions Precautions Precautions: Anterior Hip Precaution Booklet Issued: Yes (comment) Precaution Comments: orthostatics Restrictions Weight Bearing Restrictions: Yes RLE Weight Bearing: Weight bearing as tolerated      Mobility Bed Mobility Overal bed mobility: Modified Independent Bed Mobility: Supine to Sit     Supine to sit: Supervision;Min guard     General bed mobility comments: Deferred. Pt in recliner at start/end of session.    Transfers Overall transfer level: Needs assistance Equipment used: Rolling walker  (2 wheeled) Transfers: Sit to/from Stand Sit to Stand: Min guard;Supervision              Balance Overall balance assessment: Needs assistance Sitting-balance support: Feet supported;No upper extremity supported Sitting balance-Leahy Scale: Good Sitting balance - Comments: Steady static/dynamic sitting during functional task. No LOB appreciated with weight shift during session.   Standing balance support: Bilateral upper extremity supported Standing balance-Leahy Scale: Fair Standing balance comment: reliant on UE support - supersision required due to nausea and some orhostatic BP's                           ADL either performed or assessed with clinical judgement   ADL Overall ADL's : Needs assistance/impaired                                       General ADL Comments: Pt functionally limited by increased pain, decreased AROM of her RLE. She requires MIN A for LB ADL management in seated position. With cueing for safety/technique, pt is able to use sock aid to don LLE hospital sock. She declines to trial on operative extremity. Will benefit from additional reinforcement of safe use of AE for ADL mgt.     Vision Baseline Vision/History: Wears glasses Wears Glasses: Reading only Patient Visual Report: No change from baseline       Perception     Praxis      Pertinent Vitals/Pain Pain Assessment: 0-10 Pain Score: 5  Pain Location: R hip through knee. Pain Descriptors / Indicators:  Aching;Sore Pain Intervention(s): Limited activity within patient's tolerance;Monitored during session;Repositioned;Utilized relaxation techniques     Hand Dominance Right   Extremity/Trunk Assessment Upper Extremity Assessment Upper Extremity Assessment: Overall WFL for tasks assessed   Lower Extremity Assessment Lower Extremity Assessment: RLE deficits/detail;Defer to PT evaluation RLE Deficits / Details: s/p R THA   Cervical / Trunk Assessment Cervical /  Trunk Assessment: Normal   Communication Communication Communication: No difficulties   Cognition Arousal/Alertness: Awake/alert Behavior During Therapy: WFL for tasks assessed/performed Overall Cognitive Status: Within Functional Limits for tasks assessed                                     General Comments  RLE wound vac/bandages in place at start/end of session.    Exercises Total Joint Exercises Ankle Circles/Pumps: AROM;Strengthening;Both;10 reps Quad Sets: AROM;Strengthening;Both;10 reps Gluteal Sets: AROM;Strengthening;Both;10 reps Heel Slides: AAROM;Strengthening;10 reps;Right Hip ABduction/ADduction: AAROM;Strengthening;Right;10 reps Other Exercises Other Exercises: Pt/caregiver (husband) educated on role of OT in acute care setting, safe use of AE/DME for ADL management, falls prevention strategies, pet management strategies, and routines modifications to support safety and functional independence upon hospital DC. Other Exercises: OT facilitates safe use of AE to don LB clothing. See ADL section for additional details.   Shoulder Instructions      Home Living Family/patient expects to be discharged to:: Private residence Living Arrangements: Spouse/significant other;Children (Daughter is an OT) Available Help at Discharge: Family;Available 24 hours/day Type of Home: House Home Access: Stairs to enter [employer] of Steps: 5 + 4 (5 outside, 4 from basement up to main living space) Entrance Stairs-Rails: Right Home Layout: Multi-level;Laundry or work area in basement;Able to live on main level with bedroom/bathroom Alternate Level Stairs-Number of Steps: home entrance on "basement level". needs to climb 5 + 4 steps to get into the home   Bathroom Shower/Tub: Walk-in shower   Bathroom Toilet: Handicapped height     Home Equipment: Shower seat - built in;Hand held shower head   Additional Comments: Husband voices plans to purchase [employer] and sock aid.      Prior Functioning/Environment Level of Independence: Independent                 OT Problem List: Decreased coordination;Pain;Decreased range of motion;Decreased activity tolerance;Decreased safety awareness;Impaired balance (sitting and/or standing);Decreased knowledge of use of DME or AE;Decreased knowledge of precautions      OT Treatment/Interventions: Self-care/ADL training;Therapeutic exercise;DME and/or AE instruction;Balance training;Patient/family education;Therapeutic activities    OT Goals(Current goals can be found in the care plan section) Acute Rehab OT Goals Patient Stated Goal: to decrease pain OT Goal Formulation: With patient Time For Goal Achievement: 04/18/20 Potential to Achieve Goals: Good ADL Goals Pt Will Perform Lower Body Dressing: sit to/from stand;with modified independence (c LRAD PRN for improved safety and functional indep.) Pt Will Transfer to Toilet: bedside commode;regular height toilet;ambulating;with modified independence (c LRAD PRN for improved safety and functional indep.) Pt Will Perform Toileting - Clothing Manipulation and hygiene: sit to/from stand;with modified independence;with adaptive equipment (c LRAD PRN for improved safety and functional indep.)  OT Frequency: Min 1X/week   Barriers to D/C:            Co-evaluation              AM-PAC OT "6 Clicks" Daily Activity     Outcome Measure Help from another person eating meals?: None Help from another  person taking care of personal grooming?: A Little Help from another person toileting, which includes using toliet, bedpan, or urinal?: A Little Help from another person bathing (including washing, rinsing, drying)?: A Little Help from another person to put on and taking off regular upper body clothing?: A Little Help from another person to put on and taking off regular lower body clothing?: A Little 6 Click Score: 19   End of Session Equipment  Utilized During Treatment: Rolling walker;Other (comment) (Sock Aid, long handle reacher.)  Activity Tolerance: Patient tolerated treatment well Patient left: in chair;with chair alarm set;with family/visitor present  OT Visit Diagnosis: Other abnormalities of gait and mobility (R26.89);Pain Pain - Right/Left: Right Pain - part of body: Hip;Knee                Time: 3646-8032 OT Time Calculation (min): 28 min Charges:  OT General Charges $OT Visit: 1 Visit OT Evaluation $OT Eval Moderate Complexity: 1 Mod OT Treatments $Self Care/Home Management : 8-22 mins $Therapeutic Activity: 8-22 mins  Shara Blazing, M.S., OTR/L Ascom: [phone] 04/04/20, 1:29 PM

## 2020-04-04 NOTE — Progress Notes (Signed)
   Subjective: 1 Day Post-Op Procedure(s) (LRB): TOTAL HIP ARTHROPLASTY ANTERIOR APPROACH (Right) Patient reports pain as mild.   Patient is well, and has had no acute complaints or problems Denies any CP, SOB, ABD pain. We will continue therapy today.  Plan is to go Home after hospital stay.  Objective: Vital signs in last 24 hours: Temp:  [97.6 F (36.4 C)-98.7 F (37.1 C)] 98.7 F (37.1 C) (11/19 0729) Pulse Rate:  [47-80] 75 (11/19 0729) Resp:  [9-18] 16 (11/19 0729) BP: (99-125)/(44-71) 109/59 (11/19 0729) SpO2:  [94 %-100 %] 96 % (11/19 0729)  Intake/Output from previous day: 11/18 0701 - 11/19 0700 In: 2236.4 [P.O.:240; I.V.:1896.4; IV Piggyback:100] Out: 3450 [Urine:3300; Blood:150] Intake/Output this shift: No intake/output data recorded.  Recent Labs    04/03/20 1212 04/04/20 0329  HGB 10.9* 10.0*   Recent Labs    04/03/20 1212 04/04/20 0329  WBC 11.2* 7.5  RBC 3.69* 3.37*  HCT 33.2* 30.4*  PLT 197 195   Recent Labs    04/03/20 1212 04/04/20 0329  NA  --  136  K  --  3.8  CL  --  104  CO2  --  25  BUN  --  10  CREATININE 0.83 0.80  GLUCOSE  --  106*  CALCIUM  --  8.5*   No results for input(s): LABPT, INR in the last 72 hours.  EXAM General - Patient is Alert, Appropriate and Oriented Extremity - Neurovascular intact Sensation intact distally Intact pulses distally Dorsiflexion/Plantar flexion intact No cellulitis present Compartment soft Dressing - dressing C/D/I and no drainage Praveena intact without drainage. Motor Function - intact, moving foot and toes well on exam.   Past Medical History:  Diagnosis Date  . Anemia   . Anxiety and depression   . Cancer ([place]) 2018   Non Hodgkin's follicular lymphoma  . Chicken pox   . Fibrocystic breast 2011  . GERD (gastroesophageal reflux disease)   . Heart murmur   . History of mammogram 05/27/2015   birads2  . Hypertension   . Lymphocytic colitis 04/2018   Dr. Vira Agar  .  Menorrhagia   . Osteoarthritis   . Uterine fibroid     Assessment/Plan:   1 Day Post-Op Procedure(s) (LRB): TOTAL HIP ARTHROPLASTY ANTERIOR APPROACH (Right) Active Problems:   S/P hip replacement  Estimated body mass index is 23.78 kg/m as calculated from the following:   Height as of this encounter: 5\' 2"  (1.575 m).   Weight as of this encounter: 59 kg. Advance diet Up with therapy  Work on bowel movement Labs and vital signs are stable Pain well controlled. Care management to assist with discharge to home with home health PT today or tomorrow pending progress with PT today.  DVT Prophylaxis - Lovenox, TED hose and SCDs Weight-Bearing as tolerated to right leg   T. Rachelle Hora, PA-C [place] 04/04/2020, 7:55 AM

## 2020-04-04 NOTE — Progress Notes (Signed)
Physical Therapy Treatment Patient Details Name: Carly Roberts MRN: [id number] DOB: 01-17-1966 Today's Date: 04/04/2020    History of Present Illness Pt is a 54  yo female s/p R THA, WBAT. PMH of anxiety, depression.    PT Comments    Feeling ok in chair, ready to try.  Orthostatics 115/48 supine,, 109/60 sitting, 103/54 standing, 95/58 standing 3 min.  Also documented in flow sheets.  She is able to progress gait this session.  She completes 200' and stair training with RW with ease. Husband in attendance and feels comfortable with mobility.  BP's discussed with LPN.  No further mobility concerns in regards to PT session but remains limited somewhat due to BP and nausea.    Follow Up Recommendations  Home health PT;Supervision/Assistance - 24 hour     Equipment Recommendations  Rolling walker with 5" wheels    Recommendations for Other Services       Precautions / Restrictions Precautions Precautions: Anterior Hip Precaution Booklet Issued: Yes (comment) Precaution Comments: orthostatics Restrictions Weight Bearing Restrictions: Yes RLE Weight Bearing: Weight bearing as tolerated    Mobility  Bed Mobility Overal bed mobility: Modified Independent Bed Mobility: Supine to Sit     Supine to sit: Supervision;Min guard     General bed mobility comments: in reclienr before and after session  Transfers Overall transfer level: Needs assistance Equipment used: Rolling walker (2 wheeled) Transfers: Sit to/from Stand Sit to Stand: Min guard;Supervision            Ambulation/Gait Ambulation/Gait assistance: Min Gaffer (Feet): 200 Feet Assistive device: Rolling walker (2 wheeled) Gait Pattern/deviations: Step-through pattern;Decreased step length - right;Decreased step length - left Gait velocity: decreased       Stairs Stairs: Yes Stairs assistance: Min guard;Supervision Stair Management: With walker;Backwards;Forwards Number of  Stairs: 3 General stair comments: with ease   Wheelchair Mobility    Modified Rankin (Stroke Patients Only)       Balance Overall balance assessment: Needs assistance Sitting-balance support: Feet supported Sitting balance-Leahy Scale: Good     Standing balance support: Bilateral upper extremity supported Standing balance-Leahy Scale: Fair Standing balance comment: reliant on UE support - supersision required due to nausea and some orhostatic BP's                            Cognition Arousal/Alertness: Awake/alert Behavior During Therapy: WFL for tasks assessed/performed Overall Cognitive Status: Within Functional Limits for tasks assessed                                        Exercises Total Joint Exercises Ankle Circles/Pumps: AROM;Strengthening;Both;10 reps Quad Sets: AROM;Strengthening;Both;10 reps Gluteal Sets: AROM;Strengthening;Both;10 reps Heel Slides: AAROM;Strengthening;10 reps;Right Hip ABduction/ADduction: AAROM;Strengthening;Right;10 reps    General Comments        Pertinent Vitals/Pain Pain Assessment: 0-10 Pain Score: 5  Pain Descriptors / Indicators: Aching;Sore Pain Intervention(s): Limited activity within patient's tolerance;Monitored during session;Repositioned    Home Living                      Prior Function            PT Goals (current goals can now be found in the care plan section) Progress towards PT goals: Progressing toward goals    Frequency    BID      PT  Plan      Co-evaluation              AM-PAC PT "6 Clicks" Mobility   Outcome Measure  Help needed turning from your back to your side while in a flat bed without using bedrails?: A Little Help needed moving from lying on your back to sitting on the side of a flat bed without using bedrails?: A Little Help needed moving to and from a bed to a chair (including a wheelchair)?: A Little Help needed standing up from a chair  using your arms (e.g., wheelchair or bedside chair)?: A Little Help needed to walk in hospital room?: A Little Help needed climbing 3-5 steps with a railing? : A Little 6 Click Score: 18    End of Session Equipment Utilized During Treatment: Gait belt Activity Tolerance: Patient limited by fatigue;Other (comment) (limited by BP) Patient left: in bed;with call bell/phone within reach;with bed alarm set;with SCD's reapplied;with family/visitor present Nurse Communication: Mobility status PT Visit Diagnosis: Other abnormalities of gait and mobility (R26.89);Muscle weakness (generalized) (M62.81);Difficulty in walking, not elsewhere classified (R26.2);Pain Pain - Right/Left: Right Pain - part of body: Hip     Time: 6151-8343 PT Time Calculation (min) (ACUTE ONLY): 24 min  Charges:  $Gait Training: 8-22 mins $Therapeutic Exercise: 8-22 mins $Therapeutic Activity: 8-22 mins                    Chesley Noon, PTA 04/04/20, 12:22 PM

## 2020-04-04 NOTE — Discharge Summary (Addendum)
Physician Discharge Summary  Patient ID: Carly Roberts MRN: [id number] DOB/AGE: 06/19/1965 54 y.o.  Admit date: 04/03/2020 Discharge date: 04/05/2020 Admission Diagnoses:  S/P hip replacement [Z96.649]   Discharge Diagnoses: Patient Active Problem List   Diagnosis Date Noted  . S/P hip replacement 04/03/2020  . Lymphocytic colitis 11/02/2018  . Uterine fibroid 01/17/2018  . Goals of care, counseling/discussion 10/13/2017  . 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 Elevated LDL cholesterol level 11/30/2016  . Family history of coronary artery disease 11/30/2016  . Murmur 11/30/2016  . Bradycardia 11/23/2016  . Soft tissue lesion 11/23/2016  . Hordeolum externum of right lower eyelid 11/23/2016  . Bilateral breast cysts 11/08/2016  . Hair loss 03/03/2016  . Anxiety and depression 03/03/2016  . Hoarseness 03/03/2016    Past Medical History:  Diagnosis Date  . Anemia   . Anxiety and depression   . Cancer ([place]) 2018   Non Hodgkin's follicular lymphoma  . Chicken pox   . Fibrocystic breast 2011  . GERD (gastroesophageal reflux disease)   . Heart murmur   . History of mammogram 05/27/2015   birads2  . Hypertension   . Lymphocytic colitis 04/2018   Dr. Vira Agar  . Menorrhagia   . Osteoarthritis   . Uterine fibroid      Transfusion: none   Consultants (if any):   Discharged Condition: Improved  Hospital Course: Carly Roberts is an 54 y.o. female who was admitted 04/03/2020 with a diagnosis of right hip OA and went to the operating room on 04/03/2020 and underwent the above named procedures.    Surgeries: Procedure(s): TOTAL HIP ARTHROPLASTY ANTERIOR APPROACH on 04/03/2020 Patient tolerated the surgery well. Taken to PACU where she was stabilized and then transferred to the orthopedic floor.  Started on Lovenox 40 mg  q 24 hrs. Foot pumps applied bilaterally at 80 mm. Heels elevated on bed with rolled towels. No evidence  of DVT. Negative [place]oman. Physical therapy started on day #1 for gait training and transfer. OT started day #1 for ADL and assisted devices.  Patient's foley was d/c on day #1.  On postop day 1 patient made excellent progress of physical therapy.  She was able to walk 200 feet, perform stairs.  She was noted to be slightly hypotensive and orthostatic.  She was given IV fluids and narcotics were slightly decreased and patient tolerated PT well.  On post op day # 3, patient made good progress of physical therapy.  Vital signs were stable.  Patient stable and ready for discharge to home with home health PT.  Implants: Medacta Masterloc 2 lateralized stem with 50 mm Mpact DM cup and liner with ceramic S 28 mm head   She was given perioperative antibiotics:  Anti-infectives (From admission, onward)   Start     Dose/Rate Route Frequency Ordered Stop   04/03/20 1330  ceFAZolin (ANCEF) IVPB 1 g/50 mL premix        1 g 100 mL/hr over 30 Minutes Intravenous Every 6 hours 04/03/20 1135 04/03/20 2026   04/03/20 0629  ceFAZolin (ANCEF) 1-4 GM/50ML-% IVPB       Note to Pharmacy: Sharmon Leyden   : cabinet override      04/03/20 0629 04/03/20 0730   04/03/20 0600  ceFAZolin (ANCEF) IVPB 1 g/50 mL premix        1 g 100 mL/hr over 30 Minutes Intravenous On call to O.R. 04/02/20 2223 04/03/20 0800    .  She was given sequential compression devices, early ambulation, and Lovenox TEDs for DVT prophylaxis.  She benefited maximally from the hospital stay and there were no complications.    Recent vital signs:  Vitals:   04/05/20 0749 04/05/20 1123  BP: (!) 113/49 (!) 127/46  Pulse: 98 63  Resp: 15 16  Temp: 98.9 F (37.2 C) 98.2 F (36.8 C)  SpO2: 97% 98%    Recent laboratory studies:  Lab Results  Component Value Date   HGB 10.0 (L) 04/05/2020   HGB 10.0 (L) 04/04/2020   HGB 10.9 (L) 04/03/2020   Lab Results  Component Value Date   WBC 7.8 04/05/2020   PLT 190 04/05/2020   Lab  Results  Component Value Date   INR 0.91 12/20/2016   Lab Results  Component Value Date   NA 136 04/04/2020   K 3.8 04/04/2020   CL 104 04/04/2020   CO2 25 04/04/2020   BUN 10 04/04/2020   CREATININE 0.80 04/04/2020   GLUCOSE 106 (H) 04/04/2020    Discharge Medications:   Allergies as of 04/05/2020      Reactions   Acrylic Polymer [carbomer] Rash   Merthiolate [thimerosal] Rash   Sulfa Antibiotics Rash   Sulfasalazine Rash      Medication List    STOP taking these medications   nabumetone 500 MG tablet Commonly known as: RELAFEN     TAKE these medications   acetaminophen 325 MG tablet Commonly known as: TYLENOL Take 1-2 tablets (325-650 mg total) by mouth every 6 (six) hours as needed for mild pain (pain score 1-3 or temp > 100.5). What changed:   medication strength  how much to take  when to take this  reasons to take this   docusate sodium 100 MG capsule Commonly known as: COLACE Take 1 capsule (100 mg total) by mouth 2 (two) times daily.   enoxaparin 40 MG/0.4ML injection Commonly known as: LOVENOX Inject 0.4 mLs (40 mg total) into the skin daily for 14 days.   Hair/Skin/Nails Caps Take 1 capsule by mouth every evening.   HYDROcodone-acetaminophen 5-325 MG tablet Commonly known as: NORCO/VICODIN Take 1-2 tablets by mouth every 4 (four) hours as needed for moderate pain (pain score 4-6).   magnesium oxide 400 MG tablet Commonly known as: MAG-OX Take 400 mg by mouth every evening.   melatonin 5 MG Tabs Take 5 mg by mouth at bedtime as needed (sleep).   pantoprazole 20 MG tablet Commonly known as: PROTONIX Take 40 mg by mouth daily.   traMADol 50 MG tablet Commonly known as: ULTRAM Take 1 tablet (50 mg total) by mouth every 6 (six) hours as needed.   vitamin B-12 1000 MCG tablet Commonly known as: CYANOCOBALAMIN Take 1,000 mcg by mouth daily.   Vitamin D3 50 MCG (2000 UT) Tabs Take 2,000 Units by mouth every evening.             Durable Medical Equipment  (From admission, onward)         Start     Ordered   04/03/20 1136  DME Walker rolling  Once       Question Answer Comment  Walker: With 5 Inch Wheels   Patient needs a walker to treat with the following condition S/P hip replacement      04/03/20 1135   04/03/20 1136  DME 3 n 1  Once        04/03/20 1135   04/03/20 1136  DME Bedside commode  Once  Question:  Patient needs a bedside commode to treat with the following condition  Answer:  S/P hip replacement   04/03/20 1135          Diagnostic Studies: DG HIP OPERATIVE UNILAT W OR W/O PELVIS RIGHT  Result Date: 04/03/2020 CLINICAL DATA:  Right hip arthroplasty EXAM: OPERATIVE RIGHT HIP (WITH PELVIS IF PERFORMED) 10 VIEWS TECHNIQUE: Fluoroscopic spot image(s) were submitted for interpretation post-operatively. COMPARISON:  Right hip MRI dated 01/11/2020 FINDINGS: The initial frontal images of the right hip demonstrate normal appearing bones and soft tissues. A subsequent image demonstrates a right hip prosthesis in satisfactory position and alignment. The distal portion of the prosthesis is not included. No visible fracture or dislocation on this image. IMPRESSION: Satisfactory postoperative appearance of a right hip prosthesis. Electronically Signed   By: Claudie Revering M.D.   On: 04/03/2020 09:01   DG HIP UNILAT W OR W/O PELVIS 2-3 VIEWS RIGHT  Result Date: 04/03/2020 CLINICAL DATA:  Right hip arthroplasty EXAM: DG HIP (WITH OR WITHOUT PELVIS) 2-3V RIGHT COMPARISON:  None. FINDINGS: Post right total hip arthroplasty. No fracture, dislocation or other complication. IMPRESSION: Standard appearance of right total hip arthroplasty. Electronically Signed   By: Macy Mis M.D.   On: 04/03/2020 09:51    Disposition: Discharge disposition: 06-Home-Health Care Svc          Follow-up Information    Duanne Guess, PA-C Follow up in 2 week(s).   Specialties: Orthopedic Surgery, Emergency  Medicine Contact information: [place] [address] [phone]                Signed: Feliberto Gottron 04/05/2020, 12:59 PM

## 2020-04-04 NOTE — TOC Initial Note (Signed)
Transition of Care [facility]) - Initial/Assessment Note    Patient Details  Name: Carly Roberts MRN: [id number] Date of Birth: 1965-08-16  Transition of Care [facility]) CM/SW Contact:    Carly Cline, LCSW Phone Number: 04/04/2020, 12:17 PM  Clinical Narrative:    CSW met with patient and spouse at bedside. Patient lives with her spouse who will be taking her to appointments as she recovers. At baseline she drives herself to appointments. PCP is Dr. Hyacinth Roberts. [facility] [place], [place]. Patient has a 3 in 1 at home. Patient is on [facility] list for HHPT that Surgeon's office made referral for. Patient confirmed this is the agency she wants to use. CSW confirmed with Carly Roberts that they will see patient for HHPT, informed Carly Roberts that patient wants staff to contact her spouse's phone and would like PT to call them. Patient agreeable to RW being ordered as long as there is not a high copay, per [employer] Rep Carly Roberts copay is estimated $20, informed patient who is fine with this. Referral to [employer] Representative Carly Roberts for RW. Patient and spouse denied additional needs.       Expected Discharge Plan: Home w Home Health Services Barriers to Discharge: Continued Medical Work up   Patient Goals and CMS Choice Patient states their goals for this hospitalization and ongoing recovery are:: home with home health PT CMS Medicare.gov Compare Post Acute Care list provided to:: Patient Choice offered to / list presented to : Patient, Spouse  Expected Discharge Plan and Services Expected Discharge Plan: Home w Home Health Services       Living arrangements for the past 2 months: Single Family Home                 DME Arranged: Walker rolling DME Agency: [employer] Date DME Agency Contacted: 04/04/20   Representative spoke with at DME Agency: Carly Roberts HH Arranged: PT HH Agency: [facility] Date HH Agency Contacted: 04/04/20   Representative spoke with at [facility] Agency:  Carly Roberts  Prior Living Arrangements/Services Living arrangements for the past 2 months: Single Family Home Lives with:: Spouse Patient language and need for interpreter reviewed:: Yes Do you feel safe going back to the place where you live?: Yes      Need for Family Participation in Patient Care: Yes (Comment) Care giver support system in place?: Yes (comment) Current home services: DME Criminal Activity/Legal Involvement Pertinent to Current Situation/Hospitalization: No - Comment as needed  Activities of Daily Living Home Assistive Devices/Equipment: Eyeglasses ADL Screening (condition at time of admission) Patient's cognitive ability adequate to safely complete daily activities?: Yes Is the patient deaf or have difficulty hearing?: No Does the patient have difficulty seeing, even when wearing glasses/contacts?: No Does the patient have difficulty concentrating, remembering, or making decisions?: No Patient able to express need for assistance with ADLs?: Yes Does the patient have difficulty dressing or bathing?: No Independently performs ADLs?: Yes (appropriate for developmental age) Does the patient have difficulty walking or climbing stairs?: Yes Weakness of Legs: Right Weakness of Arms/Hands: None  Permission Sought/Granted Permission sought to share information with : Facility Medical sales representative, Family Supports Permission granted to share information with : Yes, Verbal Permission Granted     Permission granted to share info w AGENCY: HH, DME agencies  Permission granted to share info w Relationship: spouse Carly Roberts     Emotional Assessment Appearance:: Appears stated age Attitude/Demeanor/Rapport: Engaged Affect (typically observed): Calm Orientation: : Oriented to Self, Oriented to  Place, Oriented to  Time, Oriented to Situation Alcohol / Substance Use: Not Applicable Psych Involvement: No (comment)  Admission diagnosis:  S/P hip replacement [Z96.649] Patient Active  Problem List   Diagnosis Date Noted  . S/P hip replacement 04/03/2020  . Lymphocytic colitis 11/02/2018  . Uterine fibroid 01/17/2018  . Goals of care, counseling/discussion 10/13/2017  . 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 Elevated LDL cholesterol level 11/30/2016  . Family history of coronary artery disease 11/30/2016  . Murmur 11/30/2016  . Bradycardia 11/23/2016  . Soft tissue lesion 11/23/2016  . Hordeolum externum of right lower eyelid 11/23/2016  . Bilateral breast cysts 11/08/2016  . Hair loss 03/03/2016  . Anxiety and depression 03/03/2016  . Hoarseness 03/03/2016   PCP:  Rusty Aus, MD Pharmacy:   [id number] [place], [place] [address] [place] [address] Phone: [phone] Fax: [phone]     Social Determinants of Health (SDOH) Interventions    Readmission Risk Interventions No flowsheet data found.

## 2020-04-04 NOTE — Discharge Instructions (Signed)

## 2020-04-04 NOTE — Progress Notes (Signed)
Physical Therapy Treatment Patient Details Name: Carly Roberts MRN: [id number] DOB: 12-Oct-1965 Today's Date: 04/04/2020    History of Present Illness Pt is a 54  yo female s/p R THA, WBAT. PMH of anxiety, depression.    PT Comments    Participated in exercises as described below.  To EOB with rail and min guard/supervsion.  Stood with min guard to RW.  Pt reported feeling "pale" and nauseous again that did not clear or get worse with time.  She is able to transfer to recliner 3' with RW and min guard.  She remains in recliner.  BP 97/56 after dynamap obtained.  Remained in recliner and will return for orthostatics around lunchtime.   Follow Up Recommendations  Home health PT;Supervision/Assistance - 24 hour     Equipment Recommendations  Rolling walker with 5" wheels    Recommendations for Other Services       Precautions / Restrictions Precautions Precautions: Anterior Hip Precaution Booklet Issued: Yes (comment) Restrictions Weight Bearing Restrictions: Yes RLE Weight Bearing: Weight bearing as tolerated    Mobility  Bed Mobility Overal bed mobility: Modified Independent Bed Mobility: Supine to Sit     Supine to sit: Supervision;Min guard     General bed mobility comments: self assist for RLE  Transfers Overall transfer level: Needs assistance Equipment used: Rolling walker (2 wheeled) Transfers: Sit to/from Stand Sit to Stand: Min guard;From elevated surface            Ambulation/Gait Ambulation/Gait assistance: Min guard Gait Distance (Feet): 3 Feet Assistive device: Rolling walker (2 wheeled) Gait Pattern/deviations: Step-to pattern;Decreased stance time - right Gait velocity: decreased       Stairs             Wheelchair Mobility    Modified Rankin (Stroke Patients Only)       Balance Overall balance assessment: Needs assistance Sitting-balance support: Feet supported Sitting balance-Leahy Scale: Good     Standing balance  support: Bilateral upper extremity supported Standing balance-Leahy Scale: Fair Standing balance comment: reliant on UE support                            Cognition Arousal/Alertness: Awake/alert Behavior During Therapy: WFL for tasks assessed/performed Overall Cognitive Status: Within Functional Limits for tasks assessed                                        Exercises Total Joint Exercises Ankle Circles/Pumps: AROM;Strengthening;Both;10 reps Quad Sets: AROM;Strengthening;Both;10 reps Gluteal Sets: AROM;Strengthening;Both;10 reps Heel Slides: AAROM;Strengthening;10 reps;Right Hip ABduction/ADduction: AAROM;Strengthening;Right;10 reps    General Comments        Pertinent Vitals/Pain Pain Assessment: 0-10 Pain Score: 5  Pain Descriptors / Indicators: Aching;Sore Pain Intervention(s): Limited activity within patient's tolerance;Monitored during session;Premedicated before session;Repositioned    Home Living                      Prior Function            PT Goals (current goals can now be found in the care plan section) Progress towards PT goals: Progressing toward goals    Frequency    BID      PT Plan      Co-evaluation              AM-PAC PT "6 Clicks" Mobility   Outcome  Measure  Help needed turning from your back to your side while in a flat bed without using bedrails?: A Little Help needed moving from lying on your back to sitting on the side of a flat bed without using bedrails?: A Little Help needed moving to and from a bed to a chair (including a wheelchair)?: A Little Help needed standing up from a chair using your arms (e.g., wheelchair or bedside chair)?: A Little Help needed to walk in hospital room?: A Little Help needed climbing 3-5 steps with a railing? : A Lot 6 Click Score: 17    End of Session Equipment Utilized During Treatment: Gait belt Activity Tolerance: Patient limited by fatigue;Other  (comment) (limited by BP) Patient left: in bed;with call bell/phone within reach;with bed alarm set;with SCD's reapplied;with family/visitor present Nurse Communication: Mobility status PT Visit Diagnosis: Other abnormalities of gait and mobility (R26.89);Muscle weakness (generalized) (M62.81);Difficulty in walking, not elsewhere classified (R26.2);Pain Pain - Right/Left: Right Pain - part of body: Hip     Time: 7209-4709 PT Time Calculation (min) (ACUTE ONLY): 19 min  Charges:  $Therapeutic Exercise: 8-22 mins                    Chesley Noon, PTA 04/04/20, 10:41 AM

## 2020-04-05 LAB — CBC
HCT: 29.9 % — ABNORMAL LOW (ref 36.0–46.0)
Hemoglobin: 10 g/dL — ABNORMAL LOW (ref 12.0–15.0)
MCH: 29.7 pg (ref 26.0–34.0)
MCHC: 33.4 g/dL (ref 30.0–36.0)
MCV: 88.7 fL (ref 80.0–100.0)
Platelets: 190 10*3/uL (ref 150–400)
RBC: 3.37 MIL/uL — ABNORMAL LOW (ref 3.87–5.11)
RDW: 12.5 % (ref 11.5–15.5)
WBC: 7.8 10*3/uL (ref 4.0–10.5)
nRBC: 0 % (ref 0.0–0.2)

## 2020-04-05 MED ORDER — ACETAMINOPHEN 325 MG PO TABS
325.0000 mg | ORAL_TABLET | Freq: Four times a day (QID) | ORAL | Status: DC | PRN
Start: 2020-04-05 — End: 2021-08-19

## 2020-04-05 MED ORDER — MAGNESIUM HYDROXIDE 400 MG/5ML PO SUSP
30.0000 mL | Freq: Once | ORAL | Status: AC
  Administered 2020-04-05: 30 mL via ORAL
  Filled 2020-04-05: qty 30

## 2020-04-05 NOTE — Progress Notes (Signed)
   Subjective: 2 Days Post-Op Procedure(s) (LRB): TOTAL HIP ARTHROPLASTY ANTERIOR APPROACH (Right) Patient reports pain as mild.   Patient is well, and has had no acute complaints or problems.  Yesterday noted to have urinary retention, last night states she has been able to get up 3-4 times to go to the restroom, no issues with urination.  She denies any dizziness or lightheadedness. Denies any CP, SOB, ABD pain. We will continue therapy today.  Plan is to go Home after hospital stay.  Objective: Vital signs in last 24 hours: Temp:  [98.3 F (36.8 C)-98.7 F (37.1 C)] 98.7 F (37.1 C) (11/20 0646) Pulse Rate:  [59-75] 71 (11/20 0646) Resp:  [14-16] 14 (11/20 0646) BP: (109-143)/(45-59) 118/45 (11/20 0646) SpO2:  [95 %-98 %] 96 % (11/20 0646)  Intake/Output from previous day: 11/19 0701 - 11/20 0700 In: 1043.6 [P.O.:360; I.V.:178.5; IV Piggyback:505.1] Out: 575 [Urine:575] Intake/Output this shift: No intake/output data recorded.  Recent Labs    04/03/20 1212 04/04/20 0329 04/05/20 0626  HGB 10.9* 10.0* 10.0*   Recent Labs    04/04/20 0329 04/05/20 0626  WBC 7.5 7.8  RBC 3.37* 3.37*  HCT 30.4* 29.9*  PLT 195 190   Recent Labs    04/03/20 1212 04/04/20 0329  NA  --  136  K  --  3.8  CL  --  104  CO2  --  25  BUN  --  10  CREATININE 0.83 0.80  GLUCOSE  --  106*  CALCIUM  --  8.5*   No results for input(s): LABPT, INR in the last 72 hours.  EXAM General - Patient is Alert, Appropriate and Oriented Extremity - Neurovascular intact Sensation intact distally Intact pulses distally Dorsiflexion/Plantar flexion intact No cellulitis present Compartment soft Dressing - dressing C/D/I and no drainage Praveena intact without drainage. Motor Function - intact, moving foot and toes well on exam.   Past Medical History:  Diagnosis Date  . Anemia   . Anxiety and depression   . Cancer ([place]) 2018   Non Hodgkin's follicular lymphoma  . Chicken pox   .  Fibrocystic breast 2011  . GERD (gastroesophageal reflux disease)   . Heart murmur   . History of mammogram 05/27/2015   [id number]  . Hypertension   . Lymphocytic colitis 04/2018   Dr. Vira Agar  . Menorrhagia   . Osteoarthritis   . Uterine fibroid     Assessment/Plan:   2 Days Post-Op Procedure(s) (LRB): TOTAL HIP ARTHROPLASTY ANTERIOR APPROACH (Right) Active Problems:   S/P hip replacement  Estimated body mass index is 23.78 kg/m as calculated from the following:   Height as of this encounter: 5\' 2"  (1.575 m).   Weight as of this encounter: 59 kg. Advance diet Up with therapy  Work on bowel movement Labs and vital signs are stable Pain well controlled. Care management to assist with discharge to home with home health PT today pending good progress with physical therapy today. DVT Prophylaxis - Lovenox, TED hose and SCDs Weight-Bearing as tolerated to right leg   T. Rachelle Hora, PA-C [place] 04/05/2020, 7:18 AM

## 2020-04-05 NOTE — Progress Notes (Signed)
Pt was DC with Prevena. DC paper was given to pt and husband was included in the teaching. No BM post op but passing gas, MD was aware, and ok with it, pt was ordered bowel regimen.

## 2020-04-05 NOTE — Progress Notes (Signed)
Physical Therapy Treatment Patient Details Name: Carly Roberts MRN: [id number] DOB: 08/09/1965 Today's Date: 04/05/2020    History of Present Illness Pt is a 54  yo female s/p R THA, WBAT. PMH of anxiety, depression.    PT Comments    Patient agrees to PT treatment. She has 6/10 pain to right hip. She needs min assist for supine to sit bed mobility, mod assist for sit to supine bed mobility and min assist for transfers sit to stand. She ambulates 200 feet with min assist with RW. She is able to perform supine exercises including: SAQ, assisted heel slides, QS, GS, assisted SLR x 10. Patient will continue to benefit from skilled PT to improve mobility and strength.   Follow Up Recommendations  Home health PT     Equipment Recommendations  Rolling walker with 5" wheels    Recommendations for Other Services       Precautions / Restrictions Precautions Precautions: Anterior Hip Restrictions Weight Bearing Restrictions: Yes RLE Weight Bearing: Weight bearing as tolerated    Mobility  Bed Mobility Overal bed mobility: Needs Assistance Bed Mobility: Supine to Sit;Sit to Supine     Supine to sit: Min assist Sit to supine: Mod assist      Transfers Overall transfer level: Needs assistance Equipment used: Rolling walker (2 wheeled) Transfers: Sit to/from Stand Sit to Stand: Min guard            Ambulation/Gait Ambulation/Gait assistance: Min guard Gait Distance (Feet): 200 Feet Assistive device: Rolling walker (2 wheeled) Gait Pattern/deviations: Step-to pattern         Stairs             Wheelchair Mobility    Modified Rankin (Stroke Patients Only)       Balance Overall balance assessment: Needs assistance Sitting-balance support: Feet supported;No upper extremity supported Sitting balance-Leahy Scale: Good Sitting balance - Comments: Steady static/dynamic sitting during functional task. No LOB appreciated with weight shift during  session.     Standing balance-Leahy Scale: Fair Standing balance comment: reliant on UE support - supersision required due to nausea and some orhostatic BP's                            Cognition Arousal/Alertness: Awake/alert Behavior During Therapy: WFL for tasks assessed/performed Overall Cognitive Status: Within Functional Limits for tasks assessed                                        Exercises Total Joint Exercises Ankle Circles/Pumps: AROM;Both;15 reps Quad Sets: AROM;Both;15 reps Gluteal Sets: AROM;Both;15 reps Heel Slides: AAROM;Left;10 reps Other Exercises Other Exercises: slt x 10 AAROM    General Comments        Pertinent Vitals/Pain Pain Assessment: 0-10 Pain Score: 6  Pain Location: R hip  pain Pain Descriptors / Indicators: Aching Pain Intervention(s): Limited activity within patient's tolerance    Home Living                      Prior Function            PT Goals (current goals can now be found in the care plan section) Acute Rehab PT Goals Patient Stated Goal:  (decrease pain) PT Goal Formulation: With patient Progress towards PT goals: Progressing toward goals    Frequency    BID  PT Plan Current plan remains appropriate    Co-evaluation              AM-PAC PT "6 Clicks" Mobility   Outcome Measure  Help needed turning from your back to your side while in a flat bed without using bedrails?: A Little Help needed moving from lying on your back to sitting on the side of a flat bed without using bedrails?: A Little Help needed moving to and from a bed to a chair (including a wheelchair)?: A Little Help needed standing up from a chair using your arms (e.g., wheelchair or bedside chair)?: A Little Help needed to walk in hospital room?: A Little Help needed climbing 3-5 steps with a railing? : A Little 6 Click Score: 18    End of Session Equipment Utilized During Treatment: Gait  belt Activity Tolerance: Patient tolerated treatment well Patient left: in bed;with bed alarm set Nurse Communication: Mobility status PT Visit Diagnosis: Muscle weakness (generalized) (M62.81);Difficulty in walking, not elsewhere classified (R26.2) Pain - Right/Left: Right Pain - part of body: Hip     Time: 5916-3846 PT Time Calculation (min) (ACUTE ONLY): 33 min  Charges:  $Gait Training: 8-22 mins $Therapeutic Exercise: 8-22 mins                        Arelia Sneddon S, PT DPT 04/05/2020, 11:13 AM

## 2020-04-07 LAB — SURGICAL PATHOLOGY

## 2020-06-06 ENCOUNTER — Encounter: Payer: Self-pay | Admitting: Oncology

## 2020-06-06 ENCOUNTER — Telehealth: Payer: Self-pay | Admitting: Oncology

## 2020-06-06 NOTE — Telephone Encounter (Signed)
06/06/2020 Left VM informing pt appts have been r/s per her request. Labs r/s for 07/21/20, PET moved to 07/22/20 and f/u MD visit on 07/28/20. Left call back info in the event these dates and times do not work for her. MyChart message sent as well. SRW

## 2020-06-11 ENCOUNTER — Other Ambulatory Visit: Payer: BC Managed Care – PPO

## 2020-06-12 ENCOUNTER — Encounter: Admission: RE | Admit: 2020-06-12 | Payer: BC Managed Care – PPO | Source: Ambulatory Visit

## 2020-06-20 ENCOUNTER — Ambulatory Visit: Payer: BC Managed Care – PPO | Admitting: Oncology

## 2020-07-21 ENCOUNTER — Other Ambulatory Visit: Payer: Self-pay

## 2020-07-21 ENCOUNTER — Inpatient Hospital Stay: Payer: BC Managed Care – PPO | Attending: Oncology

## 2020-07-21 DIAGNOSIS — C8298 Follicular lymphoma, unspecified, lymph nodes of multiple sites: Secondary | ICD-10-CM

## 2020-07-21 DIAGNOSIS — Z79899 Other long term (current) drug therapy: Secondary | ICD-10-CM | POA: Diagnosis not present

## 2020-07-21 DIAGNOSIS — K219 Gastro-esophageal reflux disease without esophagitis: Secondary | ICD-10-CM | POA: Insufficient documentation

## 2020-07-21 DIAGNOSIS — Z8052 Family history of malignant neoplasm of bladder: Secondary | ICD-10-CM | POA: Diagnosis not present

## 2020-07-21 DIAGNOSIS — R531 Weakness: Secondary | ICD-10-CM | POA: Insufficient documentation

## 2020-07-21 DIAGNOSIS — I10 Essential (primary) hypertension: Secondary | ICD-10-CM | POA: Insufficient documentation

## 2020-07-21 DIAGNOSIS — M2550 Pain in unspecified joint: Secondary | ICD-10-CM | POA: Insufficient documentation

## 2020-07-21 DIAGNOSIS — Z801 Family history of malignant neoplasm of trachea, bronchus and lung: Secondary | ICD-10-CM | POA: Diagnosis not present

## 2020-07-21 DIAGNOSIS — M1990 Unspecified osteoarthritis, unspecified site: Secondary | ICD-10-CM | POA: Diagnosis not present

## 2020-07-21 DIAGNOSIS — R5383 Other fatigue: Secondary | ICD-10-CM | POA: Insufficient documentation

## 2020-07-21 DIAGNOSIS — C8200 Follicular lymphoma grade I, unspecified site: Secondary | ICD-10-CM | POA: Insufficient documentation

## 2020-07-21 DIAGNOSIS — Z8616 Personal history of COVID-19: Secondary | ICD-10-CM | POA: Diagnosis not present

## 2020-07-21 LAB — CBC WITH DIFFERENTIAL/PLATELET
Abs Immature Granulocytes: 0.03 10*3/uL (ref 0.00–0.07)
Basophils Absolute: 0.1 10*3/uL (ref 0.0–0.1)
Basophils Relative: 1 %
Eosinophils Absolute: 0.2 10*3/uL (ref 0.0–0.5)
Eosinophils Relative: 3 %
HCT: 38.3 % (ref 36.0–46.0)
Hemoglobin: 12.7 g/dL (ref 12.0–15.0)
Immature Granulocytes: 1 %
Lymphocytes Relative: 24 %
Lymphs Abs: 1.5 10*3/uL (ref 0.7–4.0)
MCH: 29.3 pg (ref 26.0–34.0)
MCHC: 33.2 g/dL (ref 30.0–36.0)
MCV: 88.5 fL (ref 80.0–100.0)
Monocytes Absolute: 0.5 10*3/uL (ref 0.1–1.0)
Monocytes Relative: 8 %
Neutro Abs: 4 10*3/uL (ref 1.7–7.7)
Neutrophils Relative %: 63 %
Platelets: 303 10*3/uL (ref 150–400)
RBC: 4.33 MIL/uL (ref 3.87–5.11)
RDW: 13.1 % (ref 11.5–15.5)
WBC: 6.2 10*3/uL (ref 4.0–10.5)
nRBC: 0 % (ref 0.0–0.2)

## 2020-07-21 LAB — COMPREHENSIVE METABOLIC PANEL
ALT: 15 U/L (ref 0–44)
AST: 19 U/L (ref 15–41)
Albumin: 4.4 g/dL (ref 3.5–5.0)
Alkaline Phosphatase: 55 U/L (ref 38–126)
Anion gap: 10 (ref 5–15)
BUN: 18 mg/dL (ref 6–20)
CO2: 25 mmol/L (ref 22–32)
Calcium: 9.3 mg/dL (ref 8.9–10.3)
Chloride: 102 mmol/L (ref 98–111)
Creatinine, Ser: 0.88 mg/dL (ref 0.44–1.00)
GFR, Estimated: >60 mL/min (ref >60)
Glucose, Bld: 86 mg/dL (ref 70–99)
Potassium: 3.7 mmol/L (ref 3.5–5.1)
Sodium: 137 mmol/L (ref 135–145)
Total Bilirubin: 0.6 mg/dL (ref 0.3–1.2)
Total Protein: 7.5 g/dL (ref 6.5–8.1)

## 2020-07-21 LAB — LACTATE DEHYDROGENASE: LDH: 160 U/L (ref 98–192)

## 2020-07-22 ENCOUNTER — Other Ambulatory Visit: Payer: Self-pay

## 2020-07-22 ENCOUNTER — Ambulatory Visit
Admission: RE | Admit: 2020-07-22 | Discharge: 2020-07-22 | Disposition: A | Discharge status: Home / Self Care | Payer: BC Managed Care – PPO | Source: Ambulatory Visit | Attending: Oncology | Admitting: Oncology

## 2020-07-22 DIAGNOSIS — D259 Leiomyoma of uterus, unspecified: Secondary | ICD-10-CM | POA: Insufficient documentation

## 2020-07-22 DIAGNOSIS — Z5181 Encounter for therapeutic drug level monitoring: Secondary | ICD-10-CM | POA: Insufficient documentation

## 2020-07-22 DIAGNOSIS — C8298 Follicular lymphoma, unspecified, lymph nodes of multiple sites: Secondary | ICD-10-CM | POA: Insufficient documentation

## 2020-07-22 LAB — GLUCOSE, CAPILLARY: Glucose-Capillary: 89 mg/dL (ref 70–99)

## 2020-07-22 MED ORDER — FLUDEOXYGLUCOSE F - 18 (FDG) INJECTION
6.7000 | Freq: Once | INTRAVENOUS | Status: AC | PRN
  Administered 2020-07-22: 7.078 via INTRAVENOUS

## 2020-07-23 LAB — KAPPA/LAMBDA LIGHT CHAINS
Kappa free light chain: 10.8 mg/L (ref 3.3–19.4)
Kappa, lambda light chain ratio: 1.02 (ref 0.26–1.65)
Lambda free light chains: 10.6 mg/L (ref 5.7–26.3)

## 2020-07-25 LAB — MULTIPLE MYELOMA PANEL, SERUM
Albumin SerPl Elph-Mcnc: 3.8 g/dL (ref 2.9–4.4)
Albumin/Glob SerPl: 1.4 (ref 0.7–1.7)
Alpha 1: 0.2 g/dL (ref 0.0–0.4)
Alpha2 Glob SerPl Elph-Mcnc: 0.8 g/dL (ref 0.4–1.0)
B-Globulin SerPl Elph-Mcnc: 1 g/dL (ref 0.7–1.3)
Gamma Glob SerPl Elph-Mcnc: 0.8 g/dL (ref 0.4–1.8)
Globulin, Total: 2.9 g/dL (ref 2.2–3.9)
IgA: 70 mg/dL — ABNORMAL LOW (ref 87–352)
IgG (Immunoglobin G), Serum: 777 mg/dL (ref 586–1602)
IgM (Immunoglobulin M), Srm: 145 mg/dL (ref 26–217)
Total Protein ELP: 6.7 g/dL (ref 6.0–8.5)

## 2020-07-28 ENCOUNTER — Inpatient Hospital Stay (HOSPITAL_BASED_OUTPATIENT_CLINIC_OR_DEPARTMENT_OTHER): Payer: BC Managed Care – PPO | Admitting: Oncology

## 2020-07-28 ENCOUNTER — Encounter: Payer: Self-pay | Admitting: Oncology

## 2020-07-28 VITALS — BP 135/56 | HR 49 | Temp 98.0°F | Resp 18 | Wt 130.7 lb

## 2020-07-28 DIAGNOSIS — M2550 Pain in unspecified joint: Secondary | ICD-10-CM | POA: Diagnosis not present

## 2020-07-28 DIAGNOSIS — C8298 Follicular lymphoma, unspecified, lymph nodes of multiple sites: Secondary | ICD-10-CM

## 2020-07-28 DIAGNOSIS — C8200 Follicular lymphoma grade I, unspecified site: Secondary | ICD-10-CM | POA: Diagnosis not present

## 2020-07-28 NOTE — Progress Notes (Signed)
[place] [facility] Follow Up Visit.   Patient Care Team: Rusty Aus, MD as PCP - General (Internal Medicine) Dalia Heading, CNM (Inactive) as Midwife (Certified Nurse Midwife) Bary Castilla, Forest Gleason, MD (General Surgery) Minna Merritts, MD as Consulting Physician (Cardiology)  REASON FOR VISIT Follow up for management of follicular lymphoma.    HISTORY OF PRESENTING ILLNESS: Carly Roberts 55 y.o. female who is referred by Dr.Oaks to [place] for evaluation and management of recently diagnosed follicular lymphoma. patientWas in her usual state of health until recently when she started to notice enlarging left neck mass she went to her primary care physician and ultrasound was done showing a few small lymph nodes. She continues to feel uncomfortableness in her neck and therefore a CT scan was performed. CT neck and chest with contrast on 12/08/2016 showed mild adenopathy in the left supraclavicular region and bilateral axillary/subpectoral small bilateral lower lobe nodules nonspecific. As followed by a PET scan on 12/10/2016 which showed pathologically enlarged Deauville 2 left supraclavicular and left subpectoral and axillary adenopathy in the chest. Pathologically enlarged nodes or retroperitoneal pelvic adenopathy in the abdomen. Normal size Deauville 2 scattered lymph nodes in the neck. Patient had ultrasound guided right inguinal node biopsy on 12/20/2016 and pathology showed follicular lymphoma, grade 1-2. KI 67 showed a low proliferation index 5%. Patient was referred to see me for discussion about treatment plan  # She was evaluate at [facility] by Dr.Grover who recommends repeating image scan. to get an idea of the pace of progression of her disease. She does not otherwise clearly meet GELF criteria except for mild symptoms related to neck lymphadenopathy as well as some mild anxiety related to deferring treatment, which is also a possible indication for treatment. If her disease  burden continues to be relatively low with slow disease pace, it is reasonable to continue watchful waiting vs discussion of single agent rituximab as a low toxicity alternative. We also discussed common side effects associated with rituximab. If her disease burden has increased, another option for treatment would be rituximab-bendamustine which likely would offer a longer remission than single agent rituximab but is also a more aggressive treatment   # Colonoscopy on 04/06/2018 showed lymphocytic colitis and biopsies of the ascending colon and sigmoid.  She was prescribed on Entocort which she feels not helping with her symptoms, so she self discontinued.  INTERVAL HISTORY Patient with history listed above reviewed by me present for follow-up of management of follicular cell lymphoma. She was accompanied by her husband. She reports feeling well.  Denies any constitutional symptoms. Continues to have waxing and waning of her cervical lymphadenopathy Patient had COVID-19 positivity in January 2022.    Review of Systems  Constitutional: Positive for malaise/fatigue. Negative for chills, fever and weight loss.  HENT: Negative for congestion, ear discharge, ear pain, nosebleeds, sinus pain and sore throat.        Neck lymph nodes swelling, wax and wane.   Eyes: Negative for double vision, photophobia, pain, discharge and redness.  Respiratory: Negative for cough, hemoptysis, sputum production, shortness of breath and wheezing.   Cardiovascular: Negative for chest pain, palpitations, orthopnea, claudication and leg swelling.  Gastrointestinal: Negative for abdominal pain, blood in stool, constipation, heartburn, melena, nausea and vomiting.  Genitourinary: Negative for dysuria, flank pain, frequency and hematuria.  Musculoskeletal: Negative for back pain and myalgias.  Skin: Negative for itching and rash.  Neurological: Negative for dizziness, tingling, tremors, focal weakness, weakness and  headaches.  Endo/Heme/Allergies: Negative for environmental allergies. Does not bruise/bleed easily.  Psychiatric/Behavioral: Negative for depression, hallucinations, substance abuse and suicidal ideas. The patient is not nervous/anxious.     MEDICAL HISTORY: Past Medical History:  Diagnosis Date  . Anemia   . Anxiety and depression   . Cancer ([place]) 2018   Non Hodgkin's follicular lymphoma  . Chicken pox   . Fibrocystic breast 2011  . GERD (gastroesophageal reflux disease)   . Heart murmur   . History of mammogram 05/27/2015   [id number]  . Hypertension   . Lymphocytic colitis 04/2018   Dr. Vira Agar  . Menorrhagia   . Osteoarthritis   . Uterine fibroid     SURGICAL HISTORY: Past Surgical History:  Procedure Laterality Date  . ABLATION  2008   endometrial ablation  . BREAST CYST ASPIRATION Bilateral 12/31/2015   FNA done by Dr. Bary Castilla  . BREAST EXCISIONAL BIOPSY Right 2005   Fibroadenoma, duct adenoma, right breast or o'clock.  . COLONOSCOPY  08/25/2011   removed 3 mm polyp Dr. Vira Agar  . COLONOSCOPY  04/2018   lymphocytic colitis  . DILATION AND CURETTAGE OF UTERUS    . HYSTEROSCOPY    . LYMPH NODE BIOPSY  2018  . TOTAL HIP ARTHROPLASTY Right 04/03/2020   Procedure: TOTAL HIP ARTHROPLASTY ANTERIOR APPROACH;  Surgeon: Hessie Knows, MD;  Location: [facility] ORS;  Service: Orthopedics;  Laterality: Right;  . UTERINE FIBROID SURGERY  09/2019  . WISDOM TOOTH EXTRACTION      SOCIAL HISTORY: Social History   Socioeconomic History  . Marital status: Married    Spouse name: Not on file  . Number of children: 2  . Years of education: 75  . Highest education level: Not on file  Occupational History  . Occupation: Armed forces training and education officer  Tobacco Use  . Smoking status: Never Smoker  . Smokeless tobacco: Never Used  Vaping Use  . Vaping Use: Never used  Substance and Sexual Activity  . Alcohol use: Yes    Alcohol/week: 0.0 standard drinks    Comment: rare  . Drug use:  No  . Sexual activity: Yes    Partners: Male    Birth control/protection: Surgical    Comment: vasectomy  Other Topics Concern  . Not on file  Social History Narrative  . Not on file   Social Determinants of Health   Financial Resource Strain: Not on file  Food Insecurity: Not on file  Transportation Needs: Not on file  Physical Activity: Not on file  Stress: Not on file  Social Connections: Not on file  Intimate Partner Violence: Not on file    FAMILY HISTORY Family History  Problem Relation Age of Onset  . Multiple sclerosis Mother   . Hypothyroidism Mother   . Diverticulosis Mother        intestinal obstruction  . Hyperlipidemia Father   . Heart disease Father        Had MI 2016  . Hypertension Father   . Skin cancer Father   . Lung cancer Maternal Aunt        39s  . Alcohol abuse Maternal Uncle   . Lung cancer Maternal Uncle        86s  . Cancer Maternal Uncle 60       kidney  . Stroke Maternal Grandmother   . Hypertension Maternal Grandmother   . Kidney disease Maternal Grandmother        had a kidney removed  . Diabetes Maternal Grandmother   . Hypertension  Paternal Grandfather   . Colon cancer Paternal Grandfather   . Autoimmune disease Sister        alopecia   . Prostate cancer Maternal Grandfather   . Colon cancer Maternal Uncle   . Breast cancer Neg Hx     ALLERGIES:  is allergic to acrylic polymer [carbomer], merthiolate [thimerosal], sulfa antibiotics, and sulfasalazine.  MEDICATIONS:  Current Outpatient Medications  Medication Sig Dispense Refill  . acetaminophen (TYLENOL) 325 MG tablet Take 1-2 tablets (325-650 mg total) by mouth every 6 (six) hours as needed for mild pain (pain score 1-3 or temp > 100.5).    Marland Kitchen Cholecalciferol (VITAMIN D3) 50 MCG (2000 UT) TABS Take 2,000 Units by mouth every evening.    . gabapentin (NEURONTIN) 300 MG capsule Take 300 mg by mouth daily as needed.    . magnesium oxide (MAG-OX) 400 MG tablet Take 400 mg by  mouth every evening.    . melatonin 5 MG TABS Take 5 mg by mouth at bedtime as needed (sleep).    . Multiple Vitamins-Minerals (HAIR/SKIN/NAILS) CAPS Take 1 capsule by mouth every evening.     . pantoprazole (PROTONIX) 20 MG tablet Take 40 mg by mouth daily.    . vitamin B-12 (CYANOCOBALAMIN) 1000 MCG tablet Take 1,000 mcg by mouth daily.    Marland Kitchen docusate sodium (COLACE) 100 MG capsule Take 1 capsule (100 mg total) by mouth 2 (two) times daily. (Patient not taking: Reported on 07/28/2020) 10 capsule 0  . enoxaparin (LOVENOX) 40 MG/0.4ML injection Inject 0.4 mLs (40 mg total) into the skin daily for 14 days. (Patient not taking: Reported on 07/28/2020) 5.6 mL 0  . HYDROcodone-acetaminophen (NORCO/VICODIN) 5-325 MG tablet Take 1-2 tablets by mouth every 4 (four) hours as needed for moderate pain (pain score 4-6). (Patient not taking: Reported on 07/28/2020) 40 tablet 0  . traMADol (ULTRAM) 50 MG tablet Take 1 tablet (50 mg total) by mouth every 6 (six) hours as needed. (Patient not taking: Reported on 07/28/2020) 30 tablet 0   No current facility-administered medications for this visit.    PHYSICAL EXAMINATION:  ECOG PERFORMANCE STATUS: 0 - Asymptomatic  Vitals:   07/28/20 1406  BP: (!) 135/56  Pulse: (!) 49  Resp: 18  Temp: 98 F (36.7 C)    Filed Weights   07/28/20 1406  Weight: 130 lb 11.2 oz (59.3 kg)     Physical Exam Constitutional:      General: She is not in acute distress.    Appearance: She is not diaphoretic.  HENT:     Head: Normocephalic and atraumatic.     Nose: Nose normal.     Mouth/Throat:     Pharynx: No oropharyngeal exudate.  Eyes:     General: No scleral icterus.       Left eye: No discharge.     Conjunctiva/sclera: Conjunctivae normal.     Pupils: Pupils are equal, round, and reactive to light.  Neck:     Vascular: No JVD.     Comments:  Small soft cervical lymph nodes bilateral, left supraclavicular node (+), appears more prominent.  Cardiovascular:      Rate and Rhythm: Normal rate and regular rhythm.     Heart sounds: Normal heart sounds. No murmur heard.   Pulmonary:     Effort: Pulmonary effort is normal. No respiratory distress.     Breath sounds: Normal breath sounds. No wheezing or rales.  Chest:     Chest wall: No tenderness.  Abdominal:  General: Bowel sounds are normal. There is no distension.     Palpations: Abdomen is soft. There is no mass.     Tenderness: There is no abdominal tenderness. There is no rebound.  Musculoskeletal:        General: No tenderness. Normal range of motion.     Cervical back: Normal range of motion and neck supple.  Lymphadenopathy:     Cervical: No cervical adenopathy.  Skin:    General: Skin is warm and dry.     Findings: No erythema or rash.  Neurological:     Mental Status: She is alert and oriented to person, place, and time.     Cranial Nerves: No cranial nerve deficit.     Motor: No abnormal muscle tone.     Coordination: Coordination normal.  Psychiatric:        Mood and Affect: Affect normal.        Judgment: Judgment normal.     LABORATORY DATA: I have personally reviewed the data as listed: CBC    Component Value Date/Time   WBC 6.2 07/21/2020 1521   RBC 4.33 07/21/2020 1521   HGB 12.7 07/21/2020 1521   HCT 38.3 07/21/2020 1521   PLT 303 07/21/2020 1521   MCV 88.5 07/21/2020 1521   MCH 29.3 07/21/2020 1521   MCHC 33.2 07/21/2020 1521   RDW 13.1 07/21/2020 1521   LYMPHSABS 1.5 07/21/2020 1521   MONOABS 0.5 07/21/2020 1521   EOSABS 0.2 07/21/2020 1521   BASOSABS 0.1 07/21/2020 1521   CMP Latest Ref Rng & Units 07/21/2020 04/04/2020 04/03/2020  Glucose 70 - 99 mg/dL 86 106(H) -  BUN 6 - 20 mg/dL 18 10 -  Creatinine 0.44 - 1.00 mg/dL 0.88 0.80 0.83  Sodium 135 - 145 mmol/L 137 136 -  Potassium 3.5 - 5.1 mmol/L 3.7 3.8 -  Chloride 98 - 111 mmol/L 102 104 -  CO2 22 - 32 mmol/L 25 25 -  Calcium 8.9 - 10.3 mg/dL 9.3 8.5(L) -  Total Protein 6.5 - 8.1 g/dL 7.5 - -   Total Bilirubin 0.3 - 1.2 mg/dL 0.6 - -  Alkaline Phos 38 - 126 U/L 55 - -  AST 15 - 41 U/L 19 - -  ALT 0 - 44 U/L 15 - -  #  Negative hepatitis panel and HIV.   Surgical Pathology 12/20/2016 CASE: [id number]  PATIENT: Rivky LONG  Surgical Pathology Report DIAGNOSIS:  A. LYMPH NODE, RIGHT GROIN; NEEDLE CORE BIOPSY:  - FOLLICULAR LYMPHOMA, GRADE 1-2, SEE COMMENT.   Comment: A sample was sent for flow cytometry ([employer]/[facility], Accession [id number]) with the following interpretation: CD10+ clonal B-cell population detected.  The case was sent to [facility] for consultation. The findings of the external consultant Dr. Angelena Form are incorporated above. This is the comment of the external consultant: Thank you for the opportunity to review this case. HE sections demonstrate small core needle biopsy tissue fragments with a dense lymphoid proliferation with a vaguely follicular architecture. The lymphocytes are composed of small mature-appearing forms. No significant population of large lymphocytes is detected. Immunohistochemical stains are performed and evaluated to further characterize this lymphoid proliferation. CD3 marks T cells and negatively highlights B-cell rich follicles. CD20 marks numerous B-cell rich follicles, as well as numerous interfollicular B cells. The B cells are positive for CD10, BCL 6 and BCL-2. CD5 marks T cells without overt coexpression in B cells, while a cyclin D1 immunostain is negative in lymphocytes. MV67 highlights follicular  dendritic cell mesh works underlying most of the follicles. Ki-67 showed a low proliferation index (5%).  Per report, flow cytometric analysis reveals a CD10 positive clonal B-cell population. For details, see [place] report. The  morphologic and immunophenotypic findings support a diagnosis of follicular lymphoma, grade 1-2, with a follicular pattern in this small submitted  sample. Selected slides from this case were reviewed and discussed at our hematopathology consensus conference on 12/23/16.      RADIOGRAPHIC STUDIES: I have personally reviewed the radiological images as listed and agree with the findings in the report NM PET Image Restag (PS) Skull Base To Thigh  Result Date: 07/22/2020 CLINICAL DATA:  Subsequent treatment strategy for follicular cell lymphoma. EXAM: NUCLEAR MEDICINE PET SKULL BASE TO THIGH TECHNIQUE: 7.08 mCi F-18 FDG was injected intravenously. Full-ring PET imaging was performed from the skull base to thigh after the radiotracer. CT data was obtained and used for attenuation correction and anatomic localization. Fasting blood glucose: 89 mg/dl COMPARISON:  PET-CT 05/22/2019 and 09/05/2017. FINDINGS: Mediastinal blood pool activity: SUV max 2.3 Liver activity: SUV max 3.7 NECK: No hypermetabolic or enlarged cervical lymph nodes are identified. A 6 mm left supraclavicular node on image 48/3 previously measured 7 mm. This has an SUV max 1.4 (less than blood pool).There are no lesions of the pharyngeal mucosal space. Incidental CT findings: none CHEST: There are no hypermetabolic mediastinal, hilar or axillary lymph nodes. There are small lymph nodes with fatty hila in both axilla. There is a calcified right subcarinal node. No hypermetabolic pulmonary activity or suspicious nodularity. Incidental CT findings: Calcified right lower lobe granuloma and additional tiny noncalcified pulmonary nodules bilaterally are unchanged. ABDOMEN/PELVIS: There is no hypermetabolic activity within the liver, adrenal glands, spleen or pancreas. The spleen is normal in size with an SUV max of 2.6. There are no enlarged or hypermetabolic abdominal lymph nodes. Mildly prominent common iliac lymph nodes bilaterally are unchanged in size. 1.0 cm short axis right common iliac node on image 174/3 has an SUV max of 3.2 (previously 3.1). There are additional mildly enlarged and mildly  hypermetabolic external iliac and inguinal lymph nodes bilaterally. The previously demonstrated most hypermetabolic right inferior inguinal lymph node has significantly decreased in size, measuring 6 mm short axis on image 243/3 (previously 13 mm). This is no longer hypermetabolic. 8 mm left inguinal node on image 221/3 has slightly enlarged and has an SUV max 3.0. The other nodes have not significantly changed. Incidental CT findings: Grossly stable left renal cortical scarring. No hydronephrosis. Developing calcification within a posterior right uterine fibroid. SKELETON: There is no hypermetabolic activity to suggest osseous metastatic disease. Incidental CT findings: Interval right total hip arthroplasty. Mild lower lumbar spondylosis. IMPRESSION: 1. Overall continued slight improvement in adenopathy with residual mildly hypermetabolic pelvic and inguinal adenopathy. Within the pelvis, there is a slightly mixed response with improvement in size and metabolic activity within an inferior right inguinal node and slight enlargement and increased metabolic activity within a node superiorly in the left groin. Deauville 3. 2. No significant residual or recurrent disease in the neck, chest or abdomen. 3. Left renal cortical scarring and calcifying right uterine fibroid. Electronically Signed   By: Richardean Sale M.D.   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 Arthralgia, unspecified joint    #Follicular lymphoma, Patient is  doing well clinically Labs are reviewed and discussed with patient. 07/22/2020 PET scan images were independently reviewed by me and discussed with patient Overall slight improvement of adenopathy with mild hypermetabolic pelvic and inguinal adenopathy.  Slight enlargement increase activity within node superiorly in the left groin.  Deauville 3 Continue observation and watchful waiting. Since she has been doing very well, I recommend patient follow-up in 1 year.  Assuming she is doing well at that point, we will repeat PET scan in 2 years.  #Right hip arthritis status post arthroplasty in November 2021.  #Her kidney function stable at 0.88.  Avoid nephrotoxins.  Encourage oral hydration.  Negative multiple myeloma panel.  Follow-up in 1 year with CBC, CMP, LDH.  Orders Placed This Encounter  Procedures  . CBC with Differential/Platelet    Standing Status:   Future    Standing Expiration Date:   07/28/2021  . Comprehensive metabolic panel    Standing Status:   Future    Standing Expiration Date:   07/28/2021  . Lactate dehydrogenase    Standing Status:   Future    Standing Expiration Date:   07/28/2021   We spent sufficient time to discuss many aspect of care, questions were answered to patient's satisfaction.    Earlie Server, MD, PhD Hematology Oncology [facility] Pager- [phone] 07/28/2020

## 2020-07-28 NOTE — Progress Notes (Signed)
Patient here for follow up. No new concerns voiced.  °

## 2021-01-26 ENCOUNTER — Telehealth: Payer: BC Managed Care – PPO

## 2021-01-26 NOTE — Telephone Encounter (Signed)
Pt calling; has annual scheduled for 02/09/21; would like to have blood work done ahead of time so can discuss at appt; labs for menopause.  [phone]

## 2021-02-09 ENCOUNTER — Ambulatory Visit (INDEPENDENT_AMBULATORY_CARE_PROVIDER_SITE_OTHER): Payer: BC Managed Care – PPO | Admitting: Obstetrics

## 2021-02-09 ENCOUNTER — Other Ambulatory Visit: Payer: Self-pay

## 2021-02-09 VITALS — BP 122/88 | Ht 62.0 in | Wt 137.0 lb

## 2021-02-09 DIAGNOSIS — Z01419 Encounter for gynecological examination (general) (routine) without abnormal findings: Secondary | ICD-10-CM | POA: Diagnosis not present

## 2021-02-09 DIAGNOSIS — R232 Flushing: Secondary | ICD-10-CM

## 2021-02-09 NOTE — Progress Notes (Signed)
Gynecology Annual Exam  PCP: Rusty Aus, MD  Chief Complaint:  Chief Complaint  Patient presents with   Annual Exam    History of Present Illness:Patient is a 55 y.o. G2P2002 presents for annual exam. The patient has no complaints today. She shares that she has had a hip replacement a few months ago, and is doing very well since the surgery. She has noticed a slight sensation in her right adnexal area and  on her lower right side since the surgery.  She does not plan on a pap smear today, but is requesting some lab work to evaluate her menopausal status. She does not get periods due to having an ablation.  LMP: No LMP recorded. Patient has had an ablation.  The patient is sexually active. She denies dyspareunia.  The patient does perform self breast exams.  There is no notable family history of breast or ovarian cancer in her family.  The patient wears seatbelts: yes.   The patient has regular exercise: yes.    The patient denies current symptoms of depression.     Review of Systems: Review of Systems  Constitutional: Negative.   HENT: Negative.    Eyes: Negative.   Respiratory: Negative.    Cardiovascular: Negative.   Gastrointestinal: Negative.   Genitourinary: Negative.   Musculoskeletal: Negative.   Skin: Negative.   Neurological: Negative.   Endo/Heme/Allergies: Negative.        Hot flashes and changes in sleep patterns  Psychiatric/Behavioral: Negative.         Hx of anxiety and depression, but denies active sxs today.   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 Bary Castilla   BREAST EXCISIONAL BIOPSY Right 2005   Fibroadenoma, duct adenoma, right breast or o'clock.   COLONOSCOPY  08/25/2011   removed 3 mm polyp Dr. Vira Agar   COLONOSCOPY  04/2018   lymphocytic colitis   DILATION AND CURETTAGE OF UTERUS     HYSTEROSCOPY     LYMPH NODE BIOPSY  2018   TOTAL HIP ARTHROPLASTY Right 04/03/2020   Procedure: TOTAL HIP ARTHROPLASTY ANTERIOR APPROACH;  Surgeon: Hessie Knows, MD;  Location: [facility] ORS;  Service: Orthopedics;  Laterality: Right;   UTERINE FIBROID SURGERY  09/2019   WISDOM TOOTH EXTRACTION      Gynecologic History:  No LMP recorded. Patient has had an ablation. Last Pap: Results were: NILM no abnormalities  Last mammogram: 2021 Results were: BI-RAD I  Obstetric History: [id number]  Family History:  Family History  Problem Relation Age of Onset   Multiple sclerosis Mother    Hypothyroidism Mother  Diverticulosis Mother        intestinal obstruction   Hyperlipidemia Father    Heart disease Father        Had MI 2016   Hypertension Father    Skin cancer Father    Lung cancer Maternal Aunt        22s   Alcohol abuse Maternal Uncle    Lung cancer Maternal Uncle        70s   Cancer Maternal Uncle 60       kidney   Stroke Maternal Grandmother    Hypertension Maternal Grandmother    Kidney disease Maternal Grandmother        had a kidney removed   Diabetes Maternal Grandmother    Hypertension Paternal Grandfather    Colon cancer Paternal Grandfather    Autoimmune disease Sister         alopecia    Prostate cancer Maternal Grandfather    Colon cancer Maternal Uncle    Breast cancer Neg Hx     Social History:  Social History   Socioeconomic History   Marital status: Married    Spouse name: Not on file   Number of children: 2   Years of education: 14   Highest education level: Not on file  Occupational History   Occupation: Armed forces training and education officer  Tobacco Use   Smoking status: Never   Smokeless tobacco: Never  Vaping Use   Vaping Use: Never used  Substance and Sexual Activity   Alcohol use: Yes    Alcohol/week: 0.0 standard drinks    Comment: rare   Drug use: No   Sexual activity: Yes    Partners: Male    Birth control/protection: Surgical    Comment: vasectomy  Other Topics Concern   Not on file  Social History Narrative   Not on file   Social Determinants of Health   Financial Resource Strain: Not on file  Food Insecurity: Not on file  Transportation Needs: Not on file  Physical Activity: Not on file  Stress: Not on file  Social Connections: Not on file  Intimate Partner Violence: Not on file    Allergies:  Allergies  Allergen Reactions   Acrylic Polymer [Carbomer] Rash   Merthiolate [Thimerosal] Rash   Sulfa Antibiotics Rash   Sulfasalazine Rash    Medications: Prior to Admission medications   Medication Sig Start Date End Date Taking? Authorizing Provider  gabapentin (NEURONTIN) 300 MG capsule Take 300 mg by mouth daily as needed. 05/14/20  Yes [provider]  pantoprazole (PROTONIX) 20 MG tablet Take 40 mg by mouth daily.   Yes [provider]  acetaminophen (TYLENOL) 325 MG tablet Take 1-2 tablets (325-650 mg total) by mouth every 6 (six) hours as needed for mild pain (pain score 1-3 or temp > 100.5). Patient not taking: Reported on 02/09/2021 04/05/20   Duanne Guess, PA-C  Cholecalciferol (VITAMIN D3) 50 MCG (2000 UT) TABS Take 2,000 Units by mouth every evening. Patient not taking: Reported on  02/09/2021    [provider]  docusate sodium (COLACE) 100 MG capsule Take 1 capsule (100 mg total) by mouth 2 (two) times daily. Patient not taking: No sig reported 04/04/20   Duanne Guess, PA-C  enoxaparin (LOVENOX) 40 MG/0.4ML injection Inject 0.4 mLs (40 mg total) into the skin daily for 14 days. Patient not taking: Reported on 07/28/2020 04/05/20 04/19/20  Duanne Guess, PA-C  HYDROcodone-acetaminophen (NORCO/VICODIN) 5-325 MG tablet Take 1-2 tablets by mouth every 4 (  four) hours as needed for moderate pain (pain score 4-6). Patient not taking: No sig reported 04/04/20   Duanne Guess, PA-C  magnesium oxide (MAG-OX) 400 MG tablet Take 400 mg by mouth every evening. Patient not taking: Reported on 02/09/2021    [provider]  melatonin 5 MG TABS Take 5 mg by mouth at bedtime as needed (sleep). Patient not taking: Reported on 02/09/2021    [provider]  Multiple Vitamins-Minerals (HAIR/SKIN/NAILS) CAPS Take 1 capsule by mouth every evening.  Patient not taking: Reported on 02/09/2021 04/17/19   [provider]  traMADol (ULTRAM) 50 MG tablet Take 1 tablet (50 mg total) by mouth every 6 (six) hours as needed. Patient not taking: No sig reported 04/04/20   Duanne Guess, PA-C  vitamin B-12 (CYANOCOBALAMIN) 1000 MCG tablet Take 1,000 mcg by mouth daily. Patient not taking: Reported on 02/09/2021    [provider]    Physical Exam Vitals: Blood pressure 122/88, height 5\' 2"  (1.575 m), weight 137 lb (62.1 kg).  General: NAD HEENT: normocephalic, anicteric Thyroid: no enlargement, no palpable nodules Pulmonary: No increased work of breathing, CTAB Cardiovascular: RRR, distal pulses 2+ Breast: Breast symmetrical, no tenderness, no palpable nodules or masses, no skin or nipple retraction present, no nipple discharge.  No axillary or supraclavicular lymphadenopathy. Abdomen: NABS, soft, non-tender, non-distended.  Umbilicus without  lesions.  No hepatomegaly, splenomegaly or masses palpable. No evidence of hernia  Genitourinary:  External: Normal external female genitalia.  Normal urethral meatus, normal Bartholin's and Skene's glands.    Vagina: Normal vaginal mucosa, no evidence of prolapse.    Cervix: Grossly normal in appearance, no bleeding  Uterus: Non-enlarged, mobile, normal contour.  No CMT  Adnexa: ovaries non-enlarged, no adnexal masses  Rectal: deferred  Lymphatic: no evidence of inguinal lymphadenopathy Extremities: no edema, erythema, or tenderness Neurologic: Grossly intact Psychiatric: mood appropriate, affect full  Female chaperone present for pelvic and breast  portions of the physical exam     Assessment: 55 y.o. G2P2002 routine annual exam  Plan: Problem List Items Addressed This Visit   None Visit Diagnoses     Hot flashes    -  Primary   Relevant Orders   TSH + free T4   FSH/LH   Women's annual routine gynecological examination           1) Mammogram - recommend yearly screening mammogram.  Mammogram Was ordered today  2) STI screening  wasoffered and declined  3) ASCCP guidelines and rational discussed.  Patient opts for every 3 years screening interval  4) Osteoporosis  - per USPTF routine screening DEXA at age 76 -   Consider FDA-approved medical therapies in postmenopausal women and men aged 25 years and older, based on the following: a) A hip or vertebral (clinical or morphometric) fracture b) T-score ? -2.5 at the femoral neck or spine after appropriate evaluation to exclude secondary causes C) Low bone mass (T-score between -1.0 and -2.5 at the femoral neck or spine) and a 10-year probability of a hip fracture ? 3% or a 10-year probability of a major osteoporosis-related fracture ? 20% based on the [place]-adapted WHO algorithm   5) Routine healthcare maintenance including cholesterol, diabetes screening discussed managed by PCP  6) Colonoscopy per her PCP.  Screening  recommended starting at age 67 for average risk individuals, age 62 for individuals deemed at increased risk (including African Americans) and recommended to continue until age 13.  For patient age 64-85 individualized  approach is recommended.  Gold standard screening is via colonoscopy, Cologuard screening is an acceptable alternative for patient unwilling or unable to undergo colonoscopy.  "Colorectal cancer screening for average?risk adults: 2018 guideline update from the American Cancer Society"[place]: A Cancer Journal for Clinicians: Oct 13, 2016   7) No follow-ups on file.  8) TSH, FSH and Prolactin, LH ordered today. We will call her with the results.    Imagene Riches, CNM  02/09/2021 5:40 PM   [facility] 02/09/2021, 5:30 PM

## 2021-02-10 LAB — TSH+FREE T4
Free T4: 0.74 ng/dL — ABNORMAL LOW (ref 0.82–1.77)
TSH: 2.46 u[IU]/mL (ref 0.450–4.500)

## 2021-02-10 LAB — FSH/LH
FSH: 43.7 m[IU]/mL
LH: 44.9 m[IU]/mL

## 2021-02-11 NOTE — Progress Notes (Signed)
Patient aware. Left a brief VM letting her know the results and that she can follow up with [place] if she desires.  Imagene Riches, CNM  02/11/2021 4:23 PM

## 2021-03-23 ENCOUNTER — Other Ambulatory Visit: Payer: Self-pay | Admitting: Obstetrics

## 2021-03-23 DIAGNOSIS — Z1231 Encounter for screening mammogram for malignant neoplasm of breast: Secondary | ICD-10-CM

## 2021-04-06 ENCOUNTER — Other Ambulatory Visit: Payer: Self-pay | Admitting: Orthopedic Surgery

## 2021-04-06 DIAGNOSIS — M7062 Trochanteric bursitis, left hip: Secondary | ICD-10-CM

## 2021-04-06 DIAGNOSIS — Z96641 Presence of right artificial hip joint: Secondary | ICD-10-CM

## 2021-04-06 DIAGNOSIS — M76892 Other specified enthesopathies of left lower limb, excluding foot: Secondary | ICD-10-CM

## 2021-04-20 ENCOUNTER — Other Ambulatory Visit: Payer: Self-pay

## 2021-04-20 ENCOUNTER — Ambulatory Visit
Admission: RE | Admit: 2021-04-20 | Discharge: 2021-04-20 | Disposition: A | Discharge status: Home / Self Care | Payer: BC Managed Care – PPO | Source: Ambulatory Visit | Attending: Orthopedic Surgery | Admitting: Orthopedic Surgery

## 2021-04-20 DIAGNOSIS — M76892 Other specified enthesopathies of left lower limb, excluding foot: Secondary | ICD-10-CM | POA: Insufficient documentation

## 2021-04-20 DIAGNOSIS — Z96641 Presence of right artificial hip joint: Secondary | ICD-10-CM | POA: Insufficient documentation

## 2021-04-20 DIAGNOSIS — M7062 Trochanteric bursitis, left hip: Secondary | ICD-10-CM | POA: Diagnosis present

## 2021-05-28 ENCOUNTER — Ambulatory Visit
Admission: RE | Admit: 2021-05-28 | Discharge: 2021-05-28 | Disposition: A | Discharge status: Home / Self Care | Payer: BC Managed Care – PPO | Source: Ambulatory Visit | Attending: Obstetrics | Admitting: Obstetrics

## 2021-05-28 ENCOUNTER — Other Ambulatory Visit: Payer: Self-pay

## 2021-05-28 DIAGNOSIS — Z1231 Encounter for screening mammogram for malignant neoplasm of breast: Secondary | ICD-10-CM | POA: Diagnosis present

## 2021-05-29 ENCOUNTER — Other Ambulatory Visit: Payer: Self-pay | Admitting: Obstetrics

## 2021-05-29 DIAGNOSIS — R928 Other abnormal and inconclusive findings on diagnostic imaging of breast: Secondary | ICD-10-CM

## 2021-05-29 DIAGNOSIS — R921 Mammographic calcification found on diagnostic imaging of breast: Secondary | ICD-10-CM

## 2021-06-09 ENCOUNTER — Encounter: Payer: Self-pay | Admitting: Oncology

## 2021-06-09 ENCOUNTER — Ambulatory Visit: Admission: RE | Admit: 2021-06-09 | Payer: BC Managed Care – PPO | Source: Ambulatory Visit

## 2021-06-11 ENCOUNTER — Other Ambulatory Visit: Payer: Self-pay

## 2021-06-11 DIAGNOSIS — C8298 Follicular lymphoma, unspecified, lymph nodes of multiple sites: Secondary | ICD-10-CM

## 2021-06-11 NOTE — Telephone Encounter (Signed)
PET scan order in and scheduling message has been sent.

## 2021-06-15 ENCOUNTER — Telehealth: Payer: Self-pay

## 2021-06-15 NOTE — Telephone Encounter (Signed)
Pt returned call and left VM for someone to contact about rescheduling her appt per Dr. Tasia Catchings request.

## 2021-06-15 NOTE — Telephone Encounter (Signed)
Per Dr. 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 Next available.

## 2021-06-16 ENCOUNTER — Other Ambulatory Visit: Payer: Self-pay

## 2021-06-16 ENCOUNTER — Ambulatory Visit
Admission: RE | Admit: 2021-06-16 | Discharge: 2021-06-16 | Disposition: A | Discharge status: Home / Self Care | Payer: BC Managed Care – PPO | Source: Ambulatory Visit | Attending: Obstetrics | Admitting: Obstetrics

## 2021-06-16 DIAGNOSIS — R921 Mammographic calcification found on diagnostic imaging of breast: Secondary | ICD-10-CM | POA: Diagnosis present

## 2021-06-16 DIAGNOSIS — R928 Other abnormal and inconclusive findings on diagnostic imaging of breast: Secondary | ICD-10-CM | POA: Insufficient documentation

## 2021-06-17 ENCOUNTER — Other Ambulatory Visit: Payer: Self-pay | Admitting: Obstetrics

## 2021-06-17 DIAGNOSIS — R921 Mammographic calcification found on diagnostic imaging of breast: Secondary | ICD-10-CM

## 2021-06-17 DIAGNOSIS — R928 Other abnormal and inconclusive findings on diagnostic imaging of breast: Secondary | ICD-10-CM

## 2021-06-23 ENCOUNTER — Other Ambulatory Visit: Payer: Self-pay

## 2021-06-23 ENCOUNTER — Ambulatory Visit
Admission: RE | Admit: 2021-06-23 | Discharge: 2021-06-23 | Disposition: A | Discharge status: Home / Self Care | Payer: BC Managed Care – PPO | Source: Ambulatory Visit | Attending: Oncology | Admitting: Oncology

## 2021-06-23 DIAGNOSIS — C8298 Follicular lymphoma, unspecified, lymph nodes of multiple sites: Secondary | ICD-10-CM | POA: Diagnosis present

## 2021-06-23 DIAGNOSIS — Z96641 Presence of right artificial hip joint: Secondary | ICD-10-CM | POA: Diagnosis not present

## 2021-06-23 DIAGNOSIS — N2889 Other specified disorders of kidney and ureter: Secondary | ICD-10-CM | POA: Insufficient documentation

## 2021-06-23 DIAGNOSIS — D259 Leiomyoma of uterus, unspecified: Secondary | ICD-10-CM | POA: Insufficient documentation

## 2021-06-23 LAB — GLUCOSE, CAPILLARY: Glucose-Capillary: 87 mg/dL (ref 70–99)

## 2021-06-23 MED ORDER — FLUDEOXYGLUCOSE F - 18 (FDG) INJECTION
7.1000 | Freq: Once | INTRAVENOUS | Status: AC | PRN
  Administered 2021-06-23: 7.45 via INTRAVENOUS

## 2021-06-26 ENCOUNTER — Inpatient Hospital Stay (HOSPITAL_BASED_OUTPATIENT_CLINIC_OR_DEPARTMENT_OTHER): Payer: BC Managed Care – PPO | Admitting: Oncology

## 2021-06-26 ENCOUNTER — Encounter: Payer: Self-pay | Admitting: Oncology

## 2021-06-26 ENCOUNTER — Inpatient Hospital Stay: Payer: BC Managed Care – PPO | Attending: Oncology

## 2021-06-26 ENCOUNTER — Other Ambulatory Visit: Payer: Self-pay

## 2021-06-26 VITALS — BP 135/54 | HR 53 | Temp 97.7°F | Resp 18 | Wt 137.7 lb

## 2021-06-26 DIAGNOSIS — R1909 Other intra-abdominal and pelvic swelling, mass and lump: Secondary | ICD-10-CM | POA: Insufficient documentation

## 2021-06-26 DIAGNOSIS — Z7189 Other specified counseling: Secondary | ICD-10-CM | POA: Diagnosis not present

## 2021-06-26 DIAGNOSIS — Z800 Family history of malignant neoplasm of digestive organs: Secondary | ICD-10-CM | POA: Insufficient documentation

## 2021-06-26 DIAGNOSIS — M1990 Unspecified osteoarthritis, unspecified site: Secondary | ICD-10-CM | POA: Diagnosis not present

## 2021-06-26 DIAGNOSIS — Z79899 Other long term (current) drug therapy: Secondary | ICD-10-CM | POA: Insufficient documentation

## 2021-06-26 DIAGNOSIS — Z801 Family history of malignant neoplasm of trachea, bronchus and lung: Secondary | ICD-10-CM | POA: Diagnosis not present

## 2021-06-26 DIAGNOSIS — I10 Essential (primary) hypertension: Secondary | ICD-10-CM | POA: Insufficient documentation

## 2021-06-26 DIAGNOSIS — Z803 Family history of malignant neoplasm of breast: Secondary | ICD-10-CM | POA: Insufficient documentation

## 2021-06-26 DIAGNOSIS — K219 Gastro-esophageal reflux disease without esophagitis: Secondary | ICD-10-CM | POA: Diagnosis not present

## 2021-06-26 DIAGNOSIS — I700 Atherosclerosis of aorta: Secondary | ICD-10-CM | POA: Diagnosis not present

## 2021-06-26 DIAGNOSIS — R590 Localized enlarged lymph nodes: Secondary | ICD-10-CM | POA: Diagnosis not present

## 2021-06-26 DIAGNOSIS — C8298 Follicular lymphoma, unspecified, lymph nodes of multiple sites: Secondary | ICD-10-CM

## 2021-06-26 DIAGNOSIS — C8208 Follicular lymphoma grade I, lymph nodes of multiple sites: Secondary | ICD-10-CM | POA: Diagnosis not present

## 2021-06-26 DIAGNOSIS — R61 Generalized hyperhidrosis: Secondary | ICD-10-CM | POA: Insufficient documentation

## 2021-06-26 LAB — COMPREHENSIVE METABOLIC PANEL
ALT: 20 U/L (ref 0–44)
AST: 21 U/L (ref 15–41)
Albumin: 4.3 g/dL (ref 3.5–5.0)
Alkaline Phosphatase: 65 U/L (ref 38–126)
Anion gap: 9 (ref 5–15)
BUN: 17 mg/dL (ref 6–20)
CO2: 27 mmol/L (ref 22–32)
Calcium: 9.6 mg/dL (ref 8.9–10.3)
Chloride: 101 mmol/L (ref 98–111)
Creatinine, Ser: 0.98 mg/dL (ref 0.44–1.00)
GFR, Estimated: >60 mL/min (ref >60)
Glucose, Bld: 103 mg/dL — ABNORMAL HIGH (ref 70–99)
Potassium: 4.3 mmol/L (ref 3.5–5.1)
Sodium: 137 mmol/L (ref 135–145)
Total Bilirubin: 0.3 mg/dL (ref 0.3–1.2)
Total Protein: 7.3 g/dL (ref 6.5–8.1)

## 2021-06-26 LAB — CBC WITH DIFFERENTIAL/PLATELET
Abs Immature Granulocytes: 0.02 10*3/uL (ref 0.00–0.07)
Basophils Absolute: 0 10*3/uL (ref 0.0–0.1)
Basophils Relative: 1 %
Eosinophils Absolute: 0.1 10*3/uL (ref 0.0–0.5)
Eosinophils Relative: 2 %
HCT: 38 % (ref 36.0–46.0)
Hemoglobin: 12.5 g/dL (ref 12.0–15.0)
Immature Granulocytes: 0 %
Lymphocytes Relative: 24 %
Lymphs Abs: 1.4 10*3/uL (ref 0.7–4.0)
MCH: 29.3 pg (ref 26.0–34.0)
MCHC: 32.9 g/dL (ref 30.0–36.0)
MCV: 89 fL (ref 80.0–100.0)
Monocytes Absolute: 0.5 10*3/uL (ref 0.1–1.0)
Monocytes Relative: 8 %
Neutro Abs: 3.7 10*3/uL (ref 1.7–7.7)
Neutrophils Relative %: 65 %
Platelets: 265 10*3/uL (ref 150–400)
RBC: 4.27 MIL/uL (ref 3.87–5.11)
RDW: 12.8 % (ref 11.5–15.5)
WBC: 5.7 10*3/uL (ref 4.0–10.5)
nRBC: 0 % (ref 0.0–0.2)

## 2021-06-26 LAB — LACTATE DEHYDROGENASE: LDH: 166 U/L (ref 98–192)

## 2021-06-26 NOTE — Progress Notes (Signed)
Patient here for follow up. Pt reports having burning sensation to right groin area, that has been going on for 4-5 months.

## 2021-06-26 NOTE — Progress Notes (Signed)
[place] [facility] Follow Up Visit.   Carly Roberts Care Team: Rusty Aus, MD as PCP - General (Internal Medicine) Dalia Heading, CNM (Inactive) as Midwife (Certified Nurse Midwife) Bary Castilla, Forest Gleason, MD (General Surgery) Minna Merritts, MD as Consulting Physician (Cardiology)  REASON FOR VISIT Follow up for management of follicular lymphoma.    HISTORY OF PRESENTING ILLNESS: Carly Roberts 56 y.o. female who is referred by Dr.Oaks to [place] for evaluation and management of recently diagnosed follicular lymphoma. patientWas in her usual state of health until recently when she started to notice enlarging left neck mass she went to her primary care physician and ultrasound was done showing a few small lymph nodes. She continues to feel uncomfortableness in her neck and therefore a CT scan was performed. CT neck and chest with contrast on 12/08/2016 showed mild adenopathy in the left supraclavicular region and bilateral axillary/subpectoral small bilateral lower lobe nodules nonspecific. As followed by a PET scan on 12/10/2016 which showed pathologically enlarged Deauville 2 left supraclavicular and left subpectoral and axillary adenopathy in the chest. Pathologically enlarged nodes or retroperitoneal pelvic adenopathy in the abdomen. Normal size Deauville 2 scattered lymph nodes in the neck. Carly Roberts had ultrasound guided right inguinal node biopsy on 12/20/2016 and pathology showed follicular lymphoma, grade 1-2. KI 67 showed a low proliferation index 5%. Carly Roberts was referred to see me for discussion about treatment plan  # She was evaluate at [facility] by Dr.Grover who recommends repeating image scan. to get an idea of the pace of progression of her disease. She does not otherwise clearly meet GELF criteria except for mild symptoms related to neck lymphadenopathy as well as some mild anxiety related to deferring treatment, which is also a possible indication for treatment. If her disease  burden continues to be relatively low with slow disease pace, it is reasonable to continue watchful waiting vs discussion of single agent rituximab as a low toxicity alternative. We also discussed common side effects associated with rituximab. If her disease burden has increased, another option for treatment would be rituximab-bendamustine which likely would offer a longer remission than single agent rituximab but is also a more aggressive treatment   # Colonoscopy on 04/06/2018 showed lymphocytic colitis and biopsies of the ascending colon and sigmoid.  She was prescribed on Entocort which she feels not helping with her symptoms, so she self discontinued.  07/22/2020 PET scan images were independently reviewed by me and discussed with Carly Roberts Overall slight improvement of adenopathy with mild hypermetabolic pelvic and inguinal adenopathy.  Slight enlargement increase activity within node superiorly in the left groin.  Deauville 3  INTERVAL HISTORY Carly Roberts with history listed above reviewed by me present for follow-up of management of follicular cell lymphoma. She was accompanied by her husband. Carly Roberts reports right groin swelling lymph node, burning sensation. + Night sweats Denies any unintentional weight loss, fever.    Review of Systems  Constitutional:  Positive for malaise/fatigue. Negative for chills, fever and weight loss.       Night sweats  HENT:  Negative for congestion, ear discharge, ear pain, nosebleeds, sinus pain and sore throat.        Neck lymph nodes swelling, wax and wane.   Eyes:  Negative for double vision, photophobia, pain, discharge and redness.  Respiratory:  Negative for cough, hemoptysis, sputum production, shortness of breath and wheezing.   Cardiovascular:  Negative for chest pain, palpitations, orthopnea, claudication and leg swelling.  Gastrointestinal:  Negative for abdominal pain, blood  in stool, constipation, heartburn, melena, nausea and vomiting.   Genitourinary:  Negative for dysuria, flank pain, frequency and hematuria.       Groin lymph node swelling   Musculoskeletal:  Negative for back pain and myalgias.  Skin:  Negative for itching and rash.  Neurological:  Negative for dizziness, tingling, tremors, focal weakness, weakness and headaches.  Endo/Heme/Allergies:  Negative for environmental allergies. Does not bruise/bleed easily.  Psychiatric/Behavioral:  Negative for depression, hallucinations, substance abuse and suicidal ideas. The Carly Roberts is not nervous/anxious.    MEDICAL HISTORY: Past Medical History:  Diagnosis Date   Anemia    Anxiety and depression    Cancer ([place]) 2018   Non Hodgkin's follicular lymphoma   Chicken pox    Fibrocystic breast 2011   GERD (gastroesophageal reflux disease)    Heart murmur    History of mammogram 05/27/2015   [id number]   Hypertension    Lymphocytic colitis 04/2018   Dr. Vira Agar   Menorrhagia    Osteoarthritis    Uterine fibroid     SURGICAL HISTORY: Past Surgical History:  Procedure Laterality Date   ABLATION  2008   endometrial ablation   BREAST CYST ASPIRATION Bilateral 12/31/2015   FNA done by Dr. Bary Castilla   BREAST EXCISIONAL BIOPSY Right 2005   Fibroadenoma, duct adenoma, right breast or o'clock.   COLONOSCOPY  08/25/2011   removed 3 mm polyp Dr. Vira Agar   COLONOSCOPY  04/2018   lymphocytic colitis   DILATION AND CURETTAGE OF UTERUS     HYSTEROSCOPY     LYMPH NODE BIOPSY  2018   TOTAL HIP ARTHROPLASTY Right 04/03/2020   Procedure: TOTAL HIP ARTHROPLASTY ANTERIOR APPROACH;  Surgeon: Hessie Knows, MD;  Location: [facility] ORS;  Service: Orthopedics;  Laterality: Right;   UTERINE FIBROID SURGERY  09/2019   WISDOM TOOTH EXTRACTION      SOCIAL HISTORY: Social History   Socioeconomic History   Marital status: Married    Spouse name: Not on file   Number of children: 2   Years of education: 14   Highest education level: Not on file  Occupational History   Occupation:  Armed forces training and education officer  Tobacco Use   Smoking status: Never   Smokeless tobacco: Never  Vaping Use   Vaping Use: Never used  Substance and Sexual Activity   Alcohol use: Yes    Alcohol/week: 0.0 standard drinks    Comment: rare   Drug use: No   Sexual activity: Yes    Partners: Male    Birth control/protection: Surgical    Comment: vasectomy  Other Topics Concern   Not on file  Social History Narrative   Not on file   Social Determinants of Health   Financial Resource Strain: Not on file  Food Insecurity: Not on file  Transportation Needs: Not on file  Physical Activity: Not on file  Stress: Not on file  Social Connections: Not on file  Intimate Partner Violence: Not on file    FAMILY HISTORY Family History  Problem Relation Age of Onset   Multiple sclerosis Mother    Hypothyroidism Mother    Diverticulosis Mother        intestinal obstruction   Hyperlipidemia Father    Heart disease Father        Had MI 2016   Hypertension Father    Skin cancer Father    Lung cancer Maternal Aunt        60s   Alcohol abuse Maternal Uncle    Lung cancer  Maternal Uncle        49s   Cancer Maternal Uncle 60       kidney   Stroke Maternal Grandmother    Hypertension Maternal Grandmother    Kidney disease Maternal Grandmother        had a kidney removed   Diabetes Maternal Grandmother    Hypertension Paternal Grandfather    Colon cancer Paternal Grandfather    Autoimmune disease Sister        alopecia    Prostate cancer Maternal Grandfather    Colon cancer Maternal Uncle    Breast cancer Neg Hx     ALLERGIES:  is allergic to acrylic polymer [carbomer], merthiolate [thimerosal], sulfa antibiotics, and sulfasalazine.  MEDICATIONS:  Current Outpatient Medications  Medication Sig Dispense Refill   gabapentin (NEURONTIN) 300 MG capsule Take 300 mg by mouth daily as needed.     pantoprazole (PROTONIX) 20 MG tablet Take 40 mg by mouth daily.     acetaminophen (TYLENOL) 325  MG tablet Take 1-2 tablets (325-650 mg total) by mouth every 6 (six) hours as needed for mild pain (pain score 1-3 or temp > 100.5). (Carly Roberts not taking: Reported on 02/09/2021)     Cholecalciferol (VITAMIN D3) 50 MCG (2000 UT) TABS Take 2,000 Units by mouth every evening. (Carly Roberts not taking: Reported on 02/09/2021)     docusate sodium (COLACE) 100 MG capsule Take 1 capsule (100 mg total) by mouth 2 (two) times daily. (Carly Roberts not taking: Reported on 07/28/2020) 10 capsule 0   enoxaparin (LOVENOX) 40 MG/0.4ML injection Inject 0.4 mLs (40 mg total) into the skin daily for 14 days. (Carly Roberts not taking: Reported on 07/28/2020) 5.6 mL 0   HYDROcodone-acetaminophen (NORCO/VICODIN) 5-325 MG tablet Take 1-2 tablets by mouth every 4 (four) hours as needed for moderate pain (pain score 4-6). (Carly Roberts not taking: Reported on 07/28/2020) 40 tablet 0   magnesium oxide (MAG-OX) 400 MG tablet Take 400 mg by mouth every evening. (Carly Roberts not taking: Reported on 02/09/2021)     melatonin 5 MG TABS Take 5 mg by mouth at bedtime as needed (sleep). (Carly Roberts not taking: Reported on 02/09/2021)     Multiple Vitamins-Minerals (HAIR/SKIN/NAILS) CAPS Take 1 capsule by mouth every evening.  (Carly Roberts not taking: Reported on 02/09/2021)     traMADol (ULTRAM) 50 MG tablet Take 1 tablet (50 mg total) by mouth every 6 (six) hours as needed. (Carly Roberts not taking: Reported on 07/28/2020) 30 tablet 0   vitamin B-12 (CYANOCOBALAMIN) 1000 MCG tablet Take 1,000 mcg by mouth daily. (Carly Roberts not taking: Reported on 02/09/2021)     No current facility-administered medications for this visit.    PHYSICAL EXAMINATION:  ECOG PERFORMANCE STATUS: 0 - Asymptomatic  Vitals:   06/26/21 1155  BP: (!) 135/54  Pulse: (!) 53  Resp: 18  Temp: 97.7 F (36.5 C)    Filed Weights   06/26/21 1155  Weight: 137 lb 11.2 oz (62.5 kg)     Physical Exam Constitutional:      General: She is not in acute distress.    Appearance: She is not diaphoretic.   HENT:     Head: Normocephalic and atraumatic.     Nose: Nose normal.     Mouth/Throat:     Pharynx: No oropharyngeal exudate.  Eyes:     General: No scleral icterus.       Left eye: No discharge.     Conjunctiva/sclera: Conjunctivae normal.     Pupils: Pupils are equal, round, and reactive  to light.  Neck:     Vascular: No JVD.     Comments:  Small soft cervical lymph nodes bilateral, left supraclavicular node (+), appears more prominent.  Cardiovascular:     Rate and Rhythm: Normal rate and regular rhythm.     Heart sounds: Normal heart sounds. No murmur heard. Pulmonary:     Effort: Pulmonary effort is normal. No respiratory distress.     Breath sounds: Normal breath sounds. No wheezing or rales.  Chest:     Chest wall: No tenderness.  Abdominal:     General: Bowel sounds are normal. There is no distension.     Palpations: Abdomen is soft. There is no mass.     Tenderness: There is no abdominal tenderness. There is no rebound.  Musculoskeletal:        General: No tenderness. Normal range of motion.     Cervical back: Normal range of motion and neck supple.  Lymphadenopathy:     Cervical: No cervical adenopathy.  Skin:    General: Skin is warm and dry.     Findings: No erythema or rash.  Neurological:     Mental Status: She is alert and oriented to person, place, and time.     Cranial Nerves: No cranial nerve deficit.     Motor: No abnormal muscle tone.     Coordination: Coordination normal.  Psychiatric:        Mood and Affect: Affect normal.        Judgment: Judgment normal.    LABORATORY DATA: I have personally reviewed the data as listed: CBC    Component Value Date/Time   WBC 5.7 06/26/2021 1141   RBC 4.27 06/26/2021 1141   HGB 12.5 06/26/2021 1141   HCT 38.0 06/26/2021 1141   PLT 265 06/26/2021 1141   MCV 89.0 06/26/2021 1141   MCH 29.3 06/26/2021 1141   MCHC 32.9 06/26/2021 1141   RDW 12.8 06/26/2021 1141   LYMPHSABS 1.4 06/26/2021 1141   MONOABS 0.5  06/26/2021 1141   EOSABS 0.1 06/26/2021 1141   BASOSABS 0.0 06/26/2021 1141   CMP Latest Ref Rng & Units 06/26/2021 07/21/2020 04/04/2020  Glucose 70 - 99 mg/dL 103(H) 86 106(H)  BUN 6 - 20 mg/dL _0 Creatinine 0.44 - 1.00 mg/dL 0.98 0.88 0.80  Sodium 135 - 145 mmol/L 137 137 136  Potassium 3.5 - 5.1 mmol/L 4.3 3.7 3.8  Chloride 98 - 111 mmol/L 101 102 104  CO2 22 - 32 mmol/L _1 Calcium 8.9 - 10.3 mg/dL 9.6 9.3 8.5(L)  Total Protein 6.5 - 8.1 g/dL 7.3 7.5 -  Total Bilirubin 0.3 - 1.2 mg/dL 0.3 0.6 -  Alkaline Phos 38 - 126 U/L 65 55 -  AST 15 - 41 U/L 21 19 -  ALT 0 - 44 U/L 20 15 -  #  Negative hepatitis panel and HIV.   Surgical Pathology 12/20/2016 CASE: [id number]  Carly Roberts: [facility] LONG  Surgical Pathology Report DIAGNOSIS:  A.  LYMPH NODE, RIGHT GROIN; NEEDLE CORE BIOPSY:  - FOLLICULAR LYMPHOMA, GRADE 1-2, SEE COMMENT.   Comment: A sample was sent for flow cytometry ([employer], Accession [id number]) with the following interpretation: CD10+ clonal B-cell population detected.  The case was sent to [facility] for consultation.  The findings of the external consultant Dr. Angelena Form are incorporated above.  This is the comment of the external consultant: Thank you for the opportunity to review this case.  HE sections  demonstrate small core needle biopsy tissue fragments with a dense lymphoid proliferation with a vaguely follicular architecture.  The lymphocytes are composed of small mature-appearing forms.  No significant population of large lymphocytes is detected. Immunohistochemical stains are performed and evaluated to further characterize this lymphoid proliferation.  CD3 marks T cells and negatively highlights B-cell rich follicles.  CD20 marks numerous B-cell rich follicles, as well as numerous interfollicular B cells.  The B cells are positive for CD10, BCL 6 and BCL-2.  CD5 marks T cells without overt coexpression in  B cells, while a cyclin D1 immunostain is negative in lymphocytes.  RP59 highlights follicular dendritic cell mesh works underlying most of the follicles.  Ki-67 showed a low proliferation index (5%).  Per report, flow cytometric analysis reveals a CD10 positive clonal B-cell population.  For details, see [place] report.  The  morphologic and immunophenotypic findings support a diagnosis of follicular lymphoma, grade 1-2, with a follicular pattern in this small submitted sample.  Selected slides from this case were reviewed and discussed at our hematopathology consensus conference on 12/23/16.      RADIOGRAPHIC STUDIES: I have personally reviewed the radiological images as listed and agree with the findings in the report MR HIP LEFT WO CONTRAST  Addendum Date: 04/21/2021   ADDENDUM REPORT: 04/21/2021 09:40 ADDENDUM: Not mentioned above: 6.3 cm right posterior uterine fibroid. Electronically Signed   By: Kathreen Devoid M.D.   On: 04/21/2021 09:40   Result Date: 04/21/2021 CLINICAL DATA:  Left hip pain extending down the leg for 1 year EXAM: MR OF THE LEFT HIP WITHOUT CONTRAST TECHNIQUE: Multiplanar, multisequence MR imaging was performed. No intravenous contrast was administered. COMPARISON:  None. FINDINGS: Bones: Right total hip arthroplasty without overt failure complication. Susceptibility artifact obscuring the adjacent soft tissue and osseous structures. No periarticular fluid collection or osteolysis. No left hip fracture, dislocation or avascular necrosis. No periosteal reaction or bone destruction. No aggressive osseous lesion. Normal sacrum and sacroiliac joints. No SI joint widening or erosive changes. Degenerative disease with disc height loss at L5-S1. Articular cartilage and labrum Articular cartilage:  No chondral defect. Labrum:  Left labral degeneration. Joint or bursal effusion Joint effusion:  No hip joint effusion.  No SI joint effusion. Bursae: Trace amount of fluid in the left  greater trochanteric bursa. Muscles and tendons Flexors: Normal. Extensors: Normal. Abductors: Normal. Adductors: Normal. Gluteals: Mild tendinosis of the left gluteus minimus and medius tendons without a tear. Hamstrings: Normal. Other findings No pelvic free fluid. No fluid collection or hematoma. No inguinal lymphadenopathy. No inguinal hernia. IMPRESSION: 1. Mild tendinosis of the left gluteus minimus and medius tendons without a tear. 2. Trace amount of fluid in the left greater trochanteric bursa. 3. No hip fracture, dislocation or avascular necrosis. Electronically Signed: By: Kathreen Devoid M.D. On: 04/21/2021 09:09   NM PET Image Restag (PS) Skull Base To Thigh  Result Date: 06/24/2021 CLINICAL DATA:  Subsequent treatment strategy for follicular lymphoma. No recent radiation therapy or chemotherapy. EXAM: NUCLEAR MEDICINE PET SKULL BASE TO THIGH TECHNIQUE: 7.5 mCi F-18 FDG was injected intravenously. Full-ring PET imaging was performed from the skull base to thigh after the radiotracer. CT data was obtained and used for attenuation correction and anatomic localization. Fasting blood glucose: 87 mg/dl COMPARISON:  07/22/2020 FINDINGS: Mediastinal blood pool activity: SUV max 2.4 Liver activity: SUV max 3.0 NECK: A right supraclavicular node measures 8 mm and a S.U.V. max of 4.6 on 58/3. This node measured 2-3 mm  and was not hypermetabolic on the prior. Incidental CT findings: No other enlarged cervical nodes. CHEST: No pulmonary parenchymal or thoracic nodal hypermetabolism. Incidental CT findings: Borderline cardiomegaly. No thoracic adenopathy. Calcified right lower lobe granuloma. ABDOMEN/PELVIS: No abdominopelvic parenchymal hypermetabolism, including within the spleen. Right common iliac node measures 8 mm and a S.U.V. max of 3.2 on 180/3. 10 mm and a S.U.V. max of 3.2 on the prior exam. A high left inguinal node which measured 8 mm and a S.U.V. max of 3.0 on the prior PET has resolved. However, there  is a new high lateral right inguinal node of 1.0 cm and a S.U.V. max of 7.1 on 215/3. A more caudal right inguinal node measures 11 mm and a S.U.V. max of 4.2 on 244/3 versus 10 mm and a S.U.V. max of 2.8 on the prior exam. Incidental CT findings: Colonic stool burden suggests constipation. Normal adrenal glands. Left renal cortical scarring involving the upper pole is similar. Abdominal aortic atherosclerosis. Calcified right-sided uterine fibroid of 5.2 cm. SKELETON: No abnormal marrow activity. Incidental CT findings: Right hip arthroplasty. IMPRESSION: 1. New active lymphoma within a right supraclavicular lymph node. 2. Mild disease progression within the pelvis. Although some nodes have decreased in size and hypermetabolism, there are new and progressive right inguinal nodes as detailed above. 3.  ([place]auville) 5. 4. Incidental findings, including: Left renal scarring. Uterine fibroid. Possible constipation. Electronically Signed   By: Abigail Miyamoto M.D.   On: 06/24/2021 09:10   MM DIAG BREAST TOMO UNI RIGHT  Result Date: 06/16/2021 CLINICAL DATA:  56 year old female presenting as a recall from screening for right breast calcifications. EXAM: DIGITAL DIAGNOSTIC UNILATERAL RIGHT MAMMOGRAM WITH TOMOSYNTHESIS AND CAD TECHNIQUE: Right digital diagnostic mammography and breast tomosynthesis was performed. The images were evaluated with computer-aided detection. COMPARISON:  Previous exam(s). ACR Breast Density Category c: The breast tissue is heterogeneously dense, which may obscure small masses. FINDINGS: Spot 2D magnification views and full field mL tomosynthesis views of the right breast were performed. There is persistence of a group of amorphous calcifications in the upper outer right breast posterior depth spanning approximately 0.7 cm. No associated mass or distortion. IMPRESSION: Indeterminate grouped calcifications spanning 0.7 cm in the upper outer right breast. RECOMMENDATION: 1. Stereotactic core needle  biopsy of the right breast calcifications. 2. If the biopsy returns as atypia or DCIS may consider additional biopsy of calcifications more anteriorly in the breast which have not significantly changed compared to priors but are similar in appearance to the group in question. I have discussed the findings and recommendations with the Carly Roberts. The Carly Roberts will be contacted by our scheduler to arrange the biopsy appointment. BI-RADS CATEGORY  4: Suspicious. Electronically Signed   By: Audie Pinto M.D.   On: 06/16/2021 15:28  MM 3D SCREEN BREAST BILATERAL  Result Date: 05/28/2021 CLINICAL DATA:  Screening. EXAM: DIGITAL SCREENING BILATERAL MAMMOGRAM WITH TOMOSYNTHESIS AND CAD TECHNIQUE: Bilateral screening digital craniocaudal and mediolateral oblique mammograms were obtained. Bilateral screening digital breast tomosynthesis was performed. The images were evaluated with computer-aided detection. COMPARISON:  Previous exam(s). ACR Breast Density Category c: The breast tissue is heterogeneously dense, which may obscure small masses. FINDINGS: In the right breast, calcifications warrant further evaluation with magnified views. In the left breast, no findings suspicious for malignancy. IMPRESSION: Further evaluation is suggested for calcifications in the right breast. RECOMMENDATION: Diagnostic mammogram of the right breast. (Code:[id number]-R-32M) The Carly Roberts will be contacted regarding the findings, and additional imaging will be scheduled. BI-RADS CATEGORY  0: Incomplete. Need additional imaging evaluation and/or prior mammograms for comparison. Electronically Signed   By: Abelardo Diesel M.D.   On: 05/28/2021 16:43        ASSESSMENT/PLAN  Cancer Staging  Follicular lymphoma of lymph nodes of multiple regions [facility]) Staging form: Hodgkin and Non-Hodgkin Lymphoma, AJCC 8th Edition - Clinical stage from 8/41/6606: Stage III (Follicular lymphoma) - Signed by Earlie Server, MD on 12/27/2016 GELF tumor disease burden:  Low Follicular Lymphoma Prognostic Index (FLIPI) score: Score 2 FLIPI-1 risk group: Intermediate risk  1. Follicular lymphoma of lymph nodes of multiple regions, unspecified follicular lymphoma type ([place])   2. 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  Mild disease progression within the pelvis.  Some nodes have decreased in size and hypermetabolic them, they are new and progressive right inguinal nodes.  Deauville 5. Carly Roberts has night sweats, symptoms from right  inguinal lymph node enlargement.  No unintentional weight loss or fever. Labs reviewed and discussed with Carly Roberts.  No overt evidence of organ damages She does not have cytopenias. Given that she starts to experience constitutional symptoms, we discussed about chemotherapy with BR x 6 cycles.  Carly Roberts would like to hold off chemotherapy treatment which I think is also reasonable.  We discussed about if her constitutional symptoms getting worse, or lymph node progressively enlarge to bulky size, or lymphoma is causing organ damages, we can trigger treatment at that time. Repeat PET scan in 1 year Carly Roberts Continue observation and watchful waiting. Carly Roberts knows to call clinic if her symptoms are progressing.  Follow-up in 1 year with CBC, CMP, LDH.  Orders Placed This Encounter  Procedures   NM PET Image Restage (PS) Skull Base to Thigh (F-18 FDG)    Standing Status:   Future    Standing Expiration Date:   06/26/2022    Order Specific Question:   If indicated for the ordered procedure, I authorize the administration of a radiopharmaceutical per Radiology protocol    Answer:   Yes     Order Specific Question:   Preferred imaging location?    Answer:   [facility]    Order Specific Question:   Radiology Contrast Protocol - do NOT remove file path    Answer:   [email or web address]    Order Specific Question:   Is the Carly Roberts pregnant?    Answer:   No   Comprehensive metabolic panel    Standing Status:   Future    Standing Expiration Date:   06/26/2022   CBC with Differential/Platelet    Standing Status:   Future    Standing Expiration Date:   06/26/2022   Lactate dehydrogenase    Standing Status:   Future    Standing Expiration Date:   06/26/2022   We spent sufficient time to discuss many aspect of care, questions were answered to Carly Roberts's satisfaction.    Earlie Server, MD, PhD Hematology Oncology  06/26/2021

## 2021-07-01 ENCOUNTER — Other Ambulatory Visit: Payer: Self-pay

## 2021-07-01 ENCOUNTER — Ambulatory Visit
Admission: RE | Admit: 2021-07-01 | Discharge: 2021-07-01 | Disposition: A | Discharge status: Home / Self Care | Payer: BC Managed Care – PPO | Source: Ambulatory Visit | Attending: Obstetrics | Admitting: Obstetrics

## 2021-07-01 DIAGNOSIS — R921 Mammographic calcification found on diagnostic imaging of breast: Secondary | ICD-10-CM

## 2021-07-01 DIAGNOSIS — R928 Other abnormal and inconclusive findings on diagnostic imaging of breast: Secondary | ICD-10-CM | POA: Diagnosis not present

## 2021-07-01 HISTORY — PX: BREAST BIOPSY: SHX20

## 2021-07-02 LAB — SURGICAL PATHOLOGY

## 2021-07-27 ENCOUNTER — Other Ambulatory Visit: Payer: BC Managed Care – PPO

## 2021-07-27 ENCOUNTER — Other Ambulatory Visit: Payer: Self-pay | Admitting: General Surgery

## 2021-07-27 ENCOUNTER — Ambulatory Visit: Payer: BC Managed Care – PPO | Admitting: Oncology

## 2021-07-27 DIAGNOSIS — N6091 Unspecified benign mammary dysplasia of right breast: Secondary | ICD-10-CM

## 2021-07-27 NOTE — Progress Notes (Signed)
Subjective:     Patient ID: Carly Roberts is a 56 y.o. female.   HPI   The following portions of the patient's history were reviewed and updated as appropriate.   This a new patient is here today for: office visit. Here for breast evaluation recommend by radiologist.  She could not feel anything different in the breast prior to the mammogram. The biopsy 07-01-21, showed ALH, minimal to no bruising. Denies any breast injury or trauma.   She is here with her husband, Ed.   The patient was seen in 2016, 2017 in regards to symptomatic breast cyst.   Review of Systems  Constitutional: Negative for chills and fever.  Respiratory: Negative for cough.          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 O\bservation at present.   GERD (gastroesophageal reflux disease) 2019    Take pantoprazole   History of abnormal cervical Pap smear     Hypertensive disorder 01/31/2020   Lymphocytic colitis 04/2018    Identified on random biopsies during colonoscopy for diarrhea.   Murmur, unspecified     Osteoarthritis             Past Surgical History:  Procedure Laterality Date   MASTECTOMY PARTIAL / LUMPECTOMY Right 2006   COLONOSCOPY   2013   ASPIRATION CYST BREAST Bilateral 2017   lymphnode biopsy   11/2016    right groin   COLONOSCOPY   04/06/2018    Lymphocytic Colitis, identified on random biopsy for diarrhea: [facility] 03/2019 Recall ltr mailed   EGD   04/06/2018    Gastritis: No repeat    UFB   2021   JOINT REPLACEMENT Right  04/03/2020    hip   ARTHROPLASTY HIP TOTAL Right 04/03/2020    [facility]   BREAST EXCISIONAL BIOPSY   07/01/2021   DILATION AND CURETTAGE, DIAGNOSTIC / THERAPEUTIC       HYSTEROSCOPY       wisdom teeth extraction                    OB History     Gravida  2   Para  2   Term      Preterm      AB      Living         SAB      IAB      Ectopic      Molar      Multiple      Live Births           Obstetric Comments  Age at first period 22 Age of first pregnancy 81             Social History  Socioeconomic History   Marital status: Married   Number of children: 2  Occupational History   Occupation: [employer]  Tobacco Use   Smoking status: Never   Smokeless tobacco: Never  Vaping Use   Vaping Use: Never used  Substance and Sexual Activity   Alcohol use: No   Drug use: No   Sexual activity: Defer            Allergies  Allergen Reactions   Acrylates/Beheneth-25 Methacrylate Copolymer Itching and Rash   Merthiolate (Benzalkonium) [Benzalkonium Chloride] Rash   Sulfa (Sulfonamide Antibiotics) Rash   Sulfasalazine Rash   Thimerosal Rash      Current Medications        Current Outpatient Medications  Medication Sig Dispense Refill   acetaminophen (TYLENOL) 500 MG tablet Take by mouth       gabapentin (NEURONTIN) 300 MG capsule Take 1 capsule (300 mg total) by mouth at bedtime 90 capsule 1   melatonin 5 mg Cap Take by mouth          meloxicam (MOBIC) 15 MG tablet Take 1 tablet (15 mg total) by mouth once daily 90 tablet 3   pantoprazole (PROTONIX) 20 MG DR tablet TAKE 1 TABLET (20 MG TOTAL) BY MOUTH ONCE DAILY TAKE 30 MINUTES BEFORE BREAKFAST. 90 tablet 0    No current facility-administered medications for this visit.             Family History  Problem Relation Age of Onset   Multiple sclerosis Mother     Hyperlipidemia (Elevated cholesterol) Father     High blood pressure (Hypertension) Father     Myocardial Infarction (Heart attack)  Father     Heart disease Father     Rheum arthritis Sister     Diabetes Maternal Grandmother     High blood pressure (Hypertension) Maternal Grandmother     Stroke Maternal Grandmother     Kidney disease Maternal Grandmother     High blood pressure (Hypertension) Paternal Grandfather     Colon cancer Paternal Grandfather     Lung cancer Maternal Aunt     Lung cancer Maternal Uncle     Alcohol abuse Maternal Uncle     Breast cancer Neg Hx          Labs and Radiology:    Mammogram review:   January 16, 2019 through July 01, 2021 imaging studies reviewed.   There is persistence of a group of amorphous calcifications in the upper outer right breast posterior depth spanning approximately 0.7 cm. No associated mass or distortion.   July 09, 2021 pathology:   DIAGNOSIS:  A. BREAST, RIGHT UPPER OUTER QUADRANT; STEREOTACTIC CORE NEEDLE BIOPSY:  - ATYPICAL LOBULAR HYPERPLASIA.  - BACKGROUND BENIGN MAMMARY PARENCHYMA WITH FIBROCYSTIC AND COLUMNAR  CELL CHANGES, WITH ASSOCIATED CALCIFICATIONS.  - NEGATIVE FOR DUCTAL CARCINOMA IN SITU AND MALIGNANCY.    PET/CT June 23, 2021:   IMPRESSION: 1. New active lymphoma within a right supraclavicular lymph node. 2. Mild disease progression within the pelvis. Although some nodes have decreased in size and hypermetabolism, there are new and progressive right inguinal nodes as detailed above. 3.  ([place]) 5. 4. Incidental findings, including: Left renal scarring. Uterine fibroid. Possible constipation.   Laboratory testing February 09, 2021:   Component Ref Range & Units 5 mo ago 2 yr ago 4 yr ago  LH mIU/mL 44.9    2.0 CM   Comment:  Adult Female:                        Follicular phase      2.4 -  12.6                        Ovulation phase      14.0 -  95.6                        Luteal phase          1.0 -  11.4                        Postmenopausal        7.7 -  58.5   FSH mIU/mL 43.7  12.8 CM   4.0 CM   Comment:                     Adult Female:                        Follicular phase      3.5 -  12.5                        Ovulation phase       4.7 -  21.5                        Luteal phase          1.7 -   7.7                        Postmenopausal       25.8 - 134.8     July 02, 2021 laboratory: Glucose 70 - 110 mg/dL 102   Sodium 136 - 145 mmol/L 139   Potassium 3.6 - 5.1 mmol/L 4.6   Chloride 97 - 109 mmol/L 107   Carbon Dioxide (CO2) 22.0 - 32.0 mmol/L 22.6   Urea Nitrogen (BUN) 7 - 25 mg/dL 19   Creatinine 0.6 - 1.1 mg/dL 1.0   Glomerular Filtration Rate (eGFR), MDRD Estimate >60 mL/min/1.73sq m 58 Low    Calcium 8.7 - 10.3 mg/dL 9.9   AST  8 - 39 U/L 16   ALT  5 - 38 U/L 13   Alk Phos (alkaline Phosphatase) 34 - 104 U/L 67   Albumin 3.5 - 4.8 g/dL 4.3   Bilirubin, Total 0.3 - 1.2 mg/dL 0.4   Protein, Total 6.1 - 7.9 g/dL 6.7   A/G Ratio 1.0 - 5.0 gm/dL 1.8       WBC (White Blood Cell Count) 4.1 - 10.2 103/uL 5.3   RBC (Red Blood Cell Count) 4.04 - 5.48 106/uL 4.43   Hemoglobin 12.0 - 15.0 gm/dL 12.7   Hematocrit 35.0 - 47.0 % 39.2   MCV (Mean Corpuscular Volume) 80.0 - 100.0 fl 88.5   MCH (Mean Corpuscular Hemoglobin) 27.0 - 31.2 pg 28.7   MCHC (Mean Corpuscular Hemoglobin Concentration) 32.0 - 36.0 gm/dL 32.4   Platelet Count 150 - 450 103/uL 293   RDW-CV (Red Cell Distribution Width) 11.6 - 14.8 % 12.8   MPV (Mean Platelet Volume) 9.4 - 12.4 fl 9.4   Neutrophils 1.50 - 7.80 103/uL 3.38   Lymphocytes 1.00 - 3.60 103/uL 1.19  Monocytes 0.00 - 1.50 103/uL 0.45   Eosinophils 0.00 - 0.55 103/uL 0.15   Basophils 0.00 - 0.09 103/uL 0.07   Neutrophil % 32.0 - 70.0 % 64.2   Lymphocyte % 10.0 - 50.0 % 22.6   Monocyte % 4.0 - 13.0 % 8.6   Eosinophil % 1.0 - 5.0 % 2.9   Basophil% 0.0 - 2.0 % 1.3   Immature Granulocyte % <=0.7 % 0.4   Immature Granulocyte Count <=0.06 10^3/L 0.02            Objective:   Physical Exam Exam conducted with a  chaperone present.  Constitutional:      Appearance: Normal appearance.  Cardiovascular:     Rate and Rhythm: Normal rate and regular rhythm.     Pulses: Normal pulses.     Heart sounds: Murmur heard.   Systolic murmur is present with a grade of 1/6. Pulmonary:     Effort: Pulmonary effort is normal.     Breath sounds: Normal breath sounds.  Chest:  Breasts:    Right: Normal.     Left: Normal.  Musculoskeletal:     Cervical back: Neck supple.  Lymphadenopathy:     Upper Body:     Right upper body: No supraclavicular or axillary adenopathy.     Left upper body: No supraclavicular or axillary adenopathy.  Skin:    General: Skin is warm and dry.  Neurological:     Mental Status: She is alert and oriented to person, place, and time.  Psychiatric:        Mood and Affect: Mood normal.        Behavior: Behavior normal.           Assessment:     Atypical lobular hyperplasia identified on biopsy for microcalcifications.   Past history lymphocytic colitis.   Follicular lymphoma with slow progression on serial PET scans.  Minimal symptoms.   Low-grade, asymptomatic cardiac murmur.    Plan:     Mild "B" symptoms with night sweats potentially related to perimenopausal state.  (Post endometrial ablation 2008).   Atypical lobular hyperplasia, excisional biopsy to clarify if any invasive component or frank LCIS is present.   Based on review of her postbiopsy studies wire localization will be appropriate.          This note is partially prepared by Karie Fetch, RN, acting as a scribe in the presence of Dr. Hervey Ard, MD.  The documentation recorded by the scribe accurately reflects the service I personally performed and the decisions made by me.    [name], MD FACS

## 2021-07-28 ENCOUNTER — Other Ambulatory Visit: Payer: Self-pay | Admitting: General Surgery

## 2021-07-28 DIAGNOSIS — N6091 Unspecified benign mammary dysplasia of right breast: Secondary | ICD-10-CM

## 2021-08-20 ENCOUNTER — Encounter
Admission: RE | Admit: 2021-08-20 | Discharge: 2021-08-20 | Disposition: A | Discharge status: Home / Self Care | Payer: BC Managed Care – PPO | Source: Ambulatory Visit | Attending: General Surgery | Admitting: General Surgery

## 2021-08-20 ENCOUNTER — Other Ambulatory Visit: Payer: Self-pay

## 2021-08-20 NOTE — Progress Notes (Signed)
Patient does not need an EKG for surgery due to not being placed on blood pressure meds and that she has never been informed she has HTN only that they are watching her Bps. ?

## 2021-08-20 NOTE — Patient Instructions (Addendum)
Your procedure is scheduled on: Friday 08/28/21 ?Report to the Registration Desk on the 1st floor of the [facility]. ?To find out your arrival time, please call [phone] between 1PM - 3PM on: Thursday 08/27/21 ? ?REMEMBER: ?Instructions that are not followed completely may result in serious medical risk, up to and including death; or upon the discretion of your surgeon and anesthesiologist your surgery may need to be rescheduled. ? ?Do not eat food after midnight the night before surgery.  ?No gum chewing, lozengers or hard candies. ? ?You may however, drink CLEAR liquids up to 2 hours before you are scheduled to arrive for your surgery. Do not drink anything within 2 hours of your scheduled arrival time. ? ?Clear liquids include: ?- water  ?- apple juice without pulp ?- gatorade (not RED colors) ?- black coffee or tea (Do NOT add milk or creamers to the coffee or tea) ?Do NOT drink anything that is not on this list. ? ?TAKE THESE MEDICATIONS THE MORNING OF SURGERY WITH A SIP OF WATER: ?pantoprazole (PROTONIX) 20 MG tablet (take one the night before and one on the morning of surgery - helps to prevent nausea after surgery.) ? ?One week prior to surgery: ?Stop Anti-inflammatories (NSAIDS) such as Meloxicam, Advil, Aleve, Ibuprofen, Motrin, Naproxen, Naprosyn and Aspirin based products such as Excedrin, Goodys Powder, BC Powder. ? ?Stop taking your Cholecalciferol (VITAMIN D3) 25 MCG (1000 UT) CAPS and ANY other OVER THE COUNTER supplements until after surgery. ? ?You may however, continue to take Tylenol if needed for pain up until the day of surgery. ? ?No Alcohol for 24 hours before or after surgery. ? ?No Smoking including e-cigarettes for 24 hours prior to surgery.  ?No chewable tobacco products for at least 6 hours prior to surgery.  ?No nicotine patches on the day of surgery. ? ?Do not use any "recreational" drugs for at least a week prior to your surgery.  ?Please be advised that the combination of  cocaine and anesthesia may have negative outcomes, up to and including death. ?If you test positive for cocaine, your surgery will be cancelled. ? ?On the morning of surgery brush your teeth with toothpaste and water, you may rinse your mouth with mouthwash if you wish. ?Do not swallow any toothpaste or mouthwash. ? ?Use Dial soap  ? ?Do not wear jewelry, make-up, hairpins, clips or nail polish. ? ?Do not wear lotions, powders, or perfumes.  ? ?Do not shave body from the neck down 48 hours prior to surgery just in case you cut yourself which could leave a site for infection.  ?Also, freshly shaved skin may become irritated if using the CHG soap. ? ?Do not bring valuables to the hospital. [facility] is not responsible for any missing/lost belongings or valuables.  ? ?Notify your doctor if there is any change in your medical condition (cold, fever, infection). ? ?Wear comfortable clothing (specific to your surgery type) to the hospital. ? ?If you are being discharged the day of surgery, you will not be allowed to drive home. ?You will need a responsible adult (18 years or older) to drive you home and stay with you that night.  ? ?If you are taking public transportation, you will need to have a responsible adult (18 years or older) with you. ?Please confirm with your physician that it is acceptable to use public transportation.  ? ?Please call the [facility] Dept. at [phone] if you have any questions about these instructions. ? ?Surgery  Visitation Policy: ? ?Patients undergoing a surgery or procedure may have two family members or support persons with them as long as the person is not COVID-19 positive or experiencing its symptoms.  ? ?Inpatient Visitation:   ? ?Visiting hours are 7 a.m. to 8 p.m. ?Up to four visitors are allowed at one time in a patient room, including children. The visitors may rotate out with other people during the day. One designated support person (adult) may remain overnight.   ?

## 2021-08-27 MED ORDER — ORAL CARE MOUTH RINSE: 15.0000 mL | Freq: Once | OROMUCOSAL | Status: AC

## 2021-08-27 MED ORDER — LACTATED RINGERS IV SOLN: INTRAVENOUS | Status: DC

## 2021-08-27 MED ORDER — CHLORHEXIDINE GLUCONATE CLOTH 2 % EX PADS: 6.0000 | MEDICATED_PAD | Freq: Once | CUTANEOUS | Status: DC

## 2021-08-27 MED ORDER — CHLORHEXIDINE GLUCONATE 0.12 % MT SOLN
15.0000 mL | Freq: Once | OROMUCOSAL | Status: AC
Start: 2021-08-27 — End: 2021-08-28

## 2021-08-28 ENCOUNTER — Ambulatory Visit
Admission: RE | Admit: 2021-08-28 | Discharge: 2021-08-28 | Disposition: A | Discharge status: Home / Self Care | Payer: BC Managed Care – PPO | Source: Ambulatory Visit | Attending: General Surgery | Admitting: General Surgery

## 2021-08-28 ENCOUNTER — Encounter
Admission: RE | Disposition: A | Discharge status: Home / Self Care | Payer: Self-pay | Source: Home / Self Care | Attending: General Surgery

## 2021-08-28 ENCOUNTER — Ambulatory Visit: Payer: BC Managed Care – PPO | Admitting: Certified Registered"

## 2021-08-28 ENCOUNTER — Other Ambulatory Visit: Payer: Self-pay

## 2021-08-28 ENCOUNTER — Encounter: Payer: Self-pay | Admitting: General Surgery

## 2021-08-28 ENCOUNTER — Ambulatory Visit
Admission: RE | Admit: 2021-08-28 | Discharge: 2021-08-28 | Disposition: A | Discharge status: Home / Self Care | Payer: BC Managed Care – PPO | Attending: General Surgery | Admitting: General Surgery

## 2021-08-28 DIAGNOSIS — D759 Disease of blood and blood-forming organs, unspecified: Secondary | ICD-10-CM | POA: Insufficient documentation

## 2021-08-28 DIAGNOSIS — D649 Anemia, unspecified: Secondary | ICD-10-CM | POA: Insufficient documentation

## 2021-08-28 DIAGNOSIS — I10 Essential (primary) hypertension: Secondary | ICD-10-CM | POA: Insufficient documentation

## 2021-08-28 DIAGNOSIS — N6021 Fibroadenosis of right breast: Secondary | ICD-10-CM | POA: Insufficient documentation

## 2021-08-28 DIAGNOSIS — N6091 Unspecified benign mammary dysplasia of right breast: Secondary | ICD-10-CM

## 2021-08-28 HISTORY — PX: BREAST BIOPSY: SHX20

## 2021-08-28 SURGERY — BREAST BIOPSY WITH NEEDLE LOCALIZATION
Anesthesia: General | Laterality: Right

## 2021-08-28 MED ORDER — ONDANSETRON HCL 4 MG/2ML IJ SOLN
INTRAMUSCULAR | Status: DC | PRN
Start: 2021-08-28 — End: 2021-08-28
  Administered 2021-08-28: 4 mg via INTRAVENOUS

## 2021-08-28 MED ORDER — OXYCODONE HCL 5 MG/5ML PO SOLN: 5.0000 mg | Freq: Once | ORAL | Status: DC | PRN

## 2021-08-28 MED ORDER — CHLORHEXIDINE GLUCONATE 0.12 % MT SOLN
OROMUCOSAL | Status: AC
  Administered 2021-08-28: 15 mL via OROMUCOSAL
  Filled 2021-08-28: qty 15

## 2021-08-28 MED ORDER — FAMOTIDINE 20 MG PO TABS
ORAL_TABLET | ORAL | Status: AC
  Filled 2021-08-28: qty 1

## 2021-08-28 MED ORDER — LIDOCAINE HCL (CARDIAC) PF 100 MG/5ML IV SOSY
PREFILLED_SYRINGE | INTRAVENOUS | Status: DC | PRN
Start: 2021-08-28 — End: 2021-08-28
  Administered 2021-08-28: 60 mg via INTRAVENOUS

## 2021-08-28 MED ORDER — HYDROCODONE-ACETAMINOPHEN 5-325 MG PO TABS: 1.0000 | ORAL_TABLET | ORAL | 0 refills | Status: DC | PRN

## 2021-08-28 MED ORDER — BUPIVACAINE-EPINEPHRINE (PF) 0.5% -1:200000 IJ SOLN
INTRAMUSCULAR | Status: DC | PRN
  Administered 2021-08-28: 20 mL

## 2021-08-28 MED ORDER — PROPOFOL 500 MG/50ML IV EMUL
INTRAVENOUS | Status: AC
  Filled 2021-08-28: qty 100

## 2021-08-28 MED ORDER — BUPIVACAINE-EPINEPHRINE (PF) 0.5% -1:200000 IJ SOLN
INTRAMUSCULAR | Status: AC
  Filled 2021-08-28: qty 30

## 2021-08-28 MED ORDER — EPHEDRINE SULFATE (PRESSORS) 50 MG/ML IJ SOLN
INTRAMUSCULAR | Status: DC | PRN
  Administered 2021-08-28 (×2): 10 mg via INTRAVENOUS

## 2021-08-28 MED ORDER — MIDAZOLAM HCL 2 MG/2ML IJ SOLN
INTRAMUSCULAR | Status: DC | PRN
  Administered 2021-08-28: 2 mg via INTRAVENOUS

## 2021-08-28 MED ORDER — DEXAMETHASONE SODIUM PHOSPHATE 10 MG/ML IJ SOLN
INTRAMUSCULAR | Status: DC | PRN
  Administered 2021-08-28: 10 mg via INTRAVENOUS

## 2021-08-28 MED ORDER — MIDAZOLAM HCL 2 MG/2ML IJ SOLN
INTRAMUSCULAR | Status: AC
  Filled 2021-08-28: qty 2

## 2021-08-28 MED ORDER — FENTANYL CITRATE (PF) 100 MCG/2ML IJ SOLN
INTRAMUSCULAR | Status: AC
  Filled 2021-08-28: qty 2

## 2021-08-28 MED ORDER — ACETAMINOPHEN 10 MG/ML IV SOLN: 1000.0000 mg | Freq: Once | INTRAVENOUS | Status: DC | PRN

## 2021-08-28 MED ORDER — OXYCODONE HCL 5 MG PO TABS: 5.0000 mg | ORAL_TABLET | Freq: Once | ORAL | Status: DC | PRN

## 2021-08-28 MED ORDER — PROMETHAZINE HCL 25 MG/ML IJ SOLN: 6.2500 mg | INTRAMUSCULAR | Status: DC | PRN

## 2021-08-28 MED ORDER — FENTANYL CITRATE (PF) 100 MCG/2ML IJ SOLN: 25.0000 ug | INTRAMUSCULAR | Status: DC | PRN

## 2021-08-28 MED ORDER — DROPERIDOL 2.5 MG/ML IJ SOLN: 0.6250 mg | Freq: Once | INTRAMUSCULAR | Status: DC | PRN

## 2021-08-28 MED ORDER — LIDOCAINE HCL (PF) 2 % IJ SOLN
INTRAMUSCULAR | Status: AC
  Filled 2021-08-28: qty 5

## 2021-08-28 MED ORDER — FENTANYL CITRATE (PF) 100 MCG/2ML IJ SOLN
INTRAMUSCULAR | Status: DC | PRN
  Administered 2021-08-28: 50 ug via INTRAVENOUS

## 2021-08-28 MED ORDER — PROPOFOL 10 MG/ML IV BOLUS
INTRAVENOUS | Status: DC | PRN
  Administered 2021-08-28: 200 mg via INTRAVENOUS

## 2021-08-28 SURGICAL SUPPLY — 43 items
BINDER BREAST MEDIUM (GAUZE/BANDAGES/DRESSINGS) ×1 IMPLANT
BLADE BOVIE TIP EXT 4 (BLADE) IMPLANT
BLADE SURG 15 STRL SS SAFETY (BLADE) ×4 IMPLANT
CHLORAPREP W/TINT 26 (MISCELLANEOUS) ×3 IMPLANT
CNTNR SPEC 2.5X3XGRAD LEK (MISCELLANEOUS)
CONT SPEC 4OZ STER OR WHT (MISCELLANEOUS)
CONTAINER SPEC 2.5X3XGRAD LEK (MISCELLANEOUS) IMPLANT
COVER PROBE FLX POLY STRL (MISCELLANEOUS) ×2 IMPLANT
DEVICE DUBIN SPECIMEN MAMMOGRA (MISCELLANEOUS) ×2 IMPLANT
DRAPE LAPAROTOMY 100X77 ABD (DRAPES) ×2 IMPLANT
DRSG GAUZE FLUFF 36X18 (GAUZE/BANDAGES/DRESSINGS) ×2 IMPLANT
DRSG TELFA 4X3 1S NADH ST (GAUZE/BANDAGES/DRESSINGS) ×4 IMPLANT
ELECT CAUTERY BLADE TIP 2.5 (TIP) ×2
ELECT REM PT RETURN 9FT ADLT (ELECTROSURGICAL) ×2
ELECTRODE CAUTERY BLDE TIP 2.5 (TIP) ×1 IMPLANT
ELECTRODE REM PT RTRN 9FT ADLT (ELECTROSURGICAL) ×1 IMPLANT
GAUZE 4X4 16PLY ~~LOC~~+RFID DBL (SPONGE) ×2 IMPLANT
GLOVE SURG ENC MOIS LTX SZ7.5 (GLOVE) ×5 IMPLANT
GLOVE SURG UNDER LTX SZ8 (GLOVE) ×5 IMPLANT
GOWN STRL REUS W/ TWL LRG LVL3 (GOWN DISPOSABLE) ×2 IMPLANT
GOWN STRL REUS W/TWL LRG LVL3 (GOWN DISPOSABLE) ×4
KIT TURNOVER KIT A (KITS) ×2 IMPLANT
LABEL OR SOLS (LABEL) ×2 IMPLANT
MANIFOLD NEPTUNE II (INSTRUMENTS) ×2 IMPLANT
MARGIN MAP 10MM (MISCELLANEOUS) ×2 IMPLANT
NDL HYPO 25X1 1.5 SAFETY (NEEDLE) ×1 IMPLANT
NEEDLE HYPO 22GX1.5 SAFETY (NEEDLE) ×3 IMPLANT
NEEDLE HYPO 25X1 1.5 SAFETY (NEEDLE) ×2 IMPLANT
PACK BASIN MINOR ARMC (MISCELLANEOUS) ×2 IMPLANT
PENCIL ELECTRO HAND CTR (MISCELLANEOUS) ×2 IMPLANT
RETRACTOR RING XSMALL (MISCELLANEOUS) IMPLANT
RTRCTR WOUND ALEXIS 13CM XS SH (MISCELLANEOUS) ×2
STRIP CLOSURE SKIN 1/2X4 (GAUZE/BANDAGES/DRESSINGS) ×2 IMPLANT
SUT ETHILON 3-0 FS-10 30 BLK (SUTURE) ×2
SUT VIC AB 2-0 CT1 27 (SUTURE) ×1
SUT VIC AB 2-0 CT1 TAPERPNT 27 (SUTURE) ×1 IMPLANT
SUT VIC AB 4-0 FS2 27 (SUTURE) ×2 IMPLANT
SUTURE EHLN 3-0 FS-10 30 BLK (SUTURE) ×1 IMPLANT
SWABSTK COMLB BENZOIN TINCTURE (MISCELLANEOUS) ×2 IMPLANT
SYR 10ML LL (SYRINGE) ×2 IMPLANT
TAPE TRANSPORE STRL 2 31045 (GAUZE/BANDAGES/DRESSINGS) IMPLANT
WATER STERILE IRR 1000ML POUR (IV SOLUTION) ×2 IMPLANT
WATER STERILE IRR 500ML POUR (IV SOLUTION) ×1 IMPLANT

## 2021-08-28 NOTE — Discharge Instructions (Signed)
AMBULATORY SURGERY  ?DISCHARGE INSTRUCTIONS ? ? ?The drugs that you were given will stay in your system until tomorrow so for the next 24 hours you should not: ? ?Drive an automobile ?Make any legal decisions ?Drink any alcoholic beverage ? ? ?You may resume regular meals tomorrow.  Today it is better to start with liquids and gradually work up to solid foods. ? ?You may eat anything you prefer, but it is better to start with liquids, then soup and crackers, and gradually work up to solid foods. ? ? ?Please notify your doctor immediately if you have any unusual bleeding, trouble breathing, redness and pain at the surgery site, drainage, fever, or pain not relieved by medication. ? ? ? ?Additional Instructions: ? ? ? ?Please contact your physician with any problems or Same Day Surgery at [phone], Monday through Friday 6 am to 4 pm, or [facility] Main number at [phone].  ?

## 2021-08-28 NOTE — Anesthesia Preprocedure Evaluation (Addendum)
Anesthesia Evaluation  ?Patient identified by MRN, date of birth, ID band ?Patient awake ? ? ? ?Reviewed: ?Allergy & Precautions, NPO status , Patient's Chart, lab work & pertinent test results ? ?History of Anesthesia Complications ?Negative for: history of anesthetic complications ? ?Airway ?Mallampati: III ? ? ? ? ? ? Dental ?no notable dental hx. ? ?  ?Pulmonary ?neg sleep apnea, neg COPD, Not current smoker,  ?  ?Pulmonary exam normal ? ? ? ? ? ? ? Cardiovascular ?Exercise Tolerance: Good ?hypertension, (-) Past MI and (-) CHF (-) dysrhythmias + Valvular Problems/Murmurs (as a child, no problems)  ?Rhythm:Regular Rate:Normal ? ? ?  ?Neuro/Psych ?neg Seizures Anxiety Depression   ? GI/Hepatic ?Neg liver ROS, GERD  Medicated and Controlled,  ?Endo/Other  ?neg diabetes ? Renal/GU ?negative Renal ROS  ? ?  ?Musculoskeletal ? ? Abdominal ?Normal abdominal exam  (+)   ?Peds ? Hematology ? ?(+) Blood dyscrasia, anemia ,   ?Anesthesia Other Findings ? ? Reproductive/Obstetrics ? ?  ? ? ? ? ? ? ? ? ? ? ? ? ? ?  ?  ? ? ? ? ? ? ?Anesthesia Physical ? ?Anesthesia Plan ? ?ASA: 2 ? ?Anesthesia Plan: General  ? ?Post-op Pain Management: Minimal or no pain anticipated and Ofirmev IV (intra-op)*  ? ?Induction: Intravenous ? ?PONV Risk Score and Plan: Propofol infusion and Ondansetron ? ?Airway Management Planned: LMA ? ?Additional Equipment:  ? ?Intra-op Plan:  ? ?Post-operative Plan: Extubation in OR ? ?Informed Consent: I have reviewed the patients History and Physical, chart, labs and discussed the procedure including the risks, benefits and alternatives for the proposed anesthesia with the patient or authorized representative who has indicated his/her understanding and acceptance.  ? ? ? ?Dental advisory given ? ?Plan Discussed with: CRNA and Anesthesiologist ? ?Anesthesia Plan Comments:   ? ? ? ? ?Anesthesia Quick Evaluation ? ?

## 2021-08-28 NOTE — Anesthesia Procedure Notes (Signed)
Procedure Name: LMA Insertion ?Date/Time: 08/28/2021 9:43 AM ?Performed by: Johney Maine, CRNA ?Pre-anesthesia Checklist: Patient identified, Patient being monitored, Timeout performed, Emergency Drugs available and Suction available ?Patient Re-evaluated:Patient Re-evaluated prior to induction ?Oxygen Delivery Method: Circle system utilized ?Preoxygenation: Pre-oxygenation with 100% oxygen ?Induction Type: IV induction ?Ventilation: Mask ventilation without difficulty ?LMA: LMA inserted ?LMA Size: 4.0 ?Tube type: Oral ?Number of attempts: 1 ?Placement Confirmation: positive ETCO2 and breath sounds checked- equal and bilateral ?Tube secured with: Tape ?Dental Injury: Teeth and Oropharynx as per pre-operative assessment  ? ? ? ? ?

## 2021-08-28 NOTE — Progress Notes (Addendum)
Patient HR Pre-op was 46. Per patient this is  chronic for her. Surgical team made aware. ?

## 2021-08-28 NOTE — Anesthesia Postprocedure Evaluation (Signed)
Anesthesia Post Note ? ?Patient: Carly Roberts ? ?Procedure(s) Performed: BREAST BIOPSY WITH NEEDLE LOCALIZATION (Right) ? ?Patient location during evaluation: PACU ?Anesthesia Type: General ?Level of consciousness: awake and alert ?Pain management: pain level controlled ?Vital Signs Assessment: post-procedure vital signs reviewed and stable ?Respiratory status: spontaneous breathing, nonlabored ventilation and respiratory function stable ?Cardiovascular status: blood pressure returned to baseline and stable ?Postop Assessment: no apparent nausea or vomiting ?Anesthetic complications: no ? ? ?No notable events documented. ? ? ?Last Vitals:  ?Vitals:  ? 08/28/21 1100 08/28/21 1116  ?BP: 121/61 126/63  ?Pulse: 73 64  ?Resp: 14 15  ?Temp: (!) 36.2 ?C (!) 36.1 ?C  ?SpO2: 100% 99%  ?  ?Last Pain:  ?Vitals:  ? 08/28/21 1116  ?TempSrc: Temporal  ?PainSc: 0-No pain  ? ? ?  ?  ?  ?  ?  ?  ? ?[place] Ouch ? ? ? ? ?

## 2021-08-28 NOTE — Op Note (Signed)
Preoperative diagnosis: Atypical lobular hyperplasia of the right breast. ? ?Postoperative diagnosis: Same. ? ?Operative procedure: Right breast biopsy with wire and ultrasound localization. ? ?Operating surgeon: Hervey Ard, MD. ? ?Anesthesia: General by LMA, Marcaine 0.5% with 1: 200,000 units of epinephrine.  20 cc ? ?Estimated blood loss: Less than 2 cc. ? ?Clinical note: This 56 year old woman recently had a biopsy showing evidence of atypical lobular hyperplasia.  She is brought to the operating room for planned excisional biopsy. ? ?She had SCD stockings were placed for surgery.  No antibiotics indicated.  Wire localization was completed by the radiology service. ? ?Operative note: With the patient under adequate general anesthesia the area was cleansed with ChloraPrep and draped.  Care was taken to protect the wire.  Ultrasound was used to identify the tract of the wire within the of the breast parenchyma.  This was outlined.  Local anesthesia was infiltrated.  A curvilinear incision in the upper outer quadrant of the right breast was made and carried down through skin and subcutaneous tissue with hemostasis bilateral cautery.  The wire was brought into the operating field.  A extra small Alexis wound protector was placed.  A block of tissue approximately 2 x 2 x 5 cm was excised orientated and specimen radiograph confirmed the tip of the wire as well as the previously placed "X" marking clip.  Good hemostasis was noted.  The deep tissue was approximated with interrupted 2-0 Vicryl figure-of-eight sutures.  A second layer was placed to obliterate dead space.  The skin was closed with a running 4-0 Vicryl subcuticular suture.  Benzoin, Steri-Strips, Telfa, fluff gauze and a compressive wrap were applied. ? ?The patient tolerated procedure well and was taken to the PACU in stable condition. ?

## 2021-08-28 NOTE — H&P (Signed)
Carly Roberts ?[id number] ?1965/11/07 ? ?  ? ?HPI: Healthy 56 y/o woman with recently identified atypical lobular hyperplasia in the right breast. For excisional biopsy.  ? ?Medications Prior to Admission  ?Medication Sig Dispense Refill Last Dose  ? Cholecalciferol (VITAMIN D3) 25 MCG (1000 UT) CAPS Take 1,000 Units by mouth every evening.   Past Week  ? gabapentin (NEURONTIN) 300 MG capsule Take 300 mg by mouth at bedtime.   08/27/2021  ? meloxicam (MOBIC) 15 MG tablet Take 15 mg by mouth daily as needed (arthritis).   Past Week  ? pantoprazole (PROTONIX) 20 MG tablet Take 20 mg by mouth in the morning.   08/28/2021  ? ?Allergies  ?Allergen Reactions  ? Acrylic Polymer [Carbomer] Rash  ? Merthiolate [Thimerosal] Rash  ? Sulfa Antibiotics Rash  ? Sulfasalazine Rash  ? ?Past Medical History:  ?Diagnosis Date  ? Anemia   ? Anxiety and depression   ? Cancer [facility]) 2018  ? Non Hodgkin's follicular lymphoma  ? Chicken pox   ? Fibrocystic breast 2011  ? GERD (gastroesophageal reflux disease)   ? Heart murmur   ? History of mammogram 05/27/2015  ? birads2  ? Hypertension   ? Lymphocytic colitis 04/2018  ? Dr. Vira Agar  ? Menorrhagia   ? Osteoarthritis   ? Uterine fibroid   ? ?Past Surgical History:  ?Procedure Laterality Date  ? ABLATION  2008  ? endometrial ablation  ? BREAST BIOPSY Right 07/01/2021  ? X Clip- path pending  ? BREAST CYST ASPIRATION Bilateral 12/31/2015  ? FNA done by Dr. Bary Castilla  ? BREAST EXCISIONAL BIOPSY Right 2005  ? Fibroadenoma, duct adenoma, right breast or o'clock.  ? COLONOSCOPY  08/25/2011  ? removed 3 mm polyp Dr. Vira Agar  ? COLONOSCOPY  04/2018  ? lymphocytic colitis  ? DILATION AND CURETTAGE OF UTERUS    ? HYSTEROSCOPY    ? LYMPH NODE BIOPSY  2018  ? TOTAL HIP ARTHROPLASTY Right 04/03/2020  ? Procedure: TOTAL HIP ARTHROPLASTY ANTERIOR APPROACH;  Surgeon: Hessie Knows, MD;  Location: [facility] ORS;  Service: Orthopedics;  Laterality: Right;  ? UTERINE FIBROID SURGERY  09/2019  ? WISDOM TOOTH  EXTRACTION    ? ?Social History  ? ?Socioeconomic History  ? Marital status: Married  ?  Spouse name: Ed  ? Number of children: 2  ? Years of education: 63  ? Highest education level: Not on file  ?Occupational History  ? Occupation: Armed forces training and education officer  ?Tobacco Use  ? Smoking status: Never  ? Smokeless tobacco: Never  ?Vaping Use  ? Vaping Use: Never used  ?Substance and Sexual Activity  ? Alcohol use: Yes  ?  Alcohol/week: 0.0 standard drinks  ?  Comment: rare  ? Drug use: No  ? Sexual activity: Yes  ?  Partners: Male  ?  Birth control/protection: Surgical  ?  Comment: vasectomy  ?Other Topics Concern  ? Not on file  ?Social History Narrative  ? Not on file  ? ?Social Determinants of Health  ? ?Financial Resource Strain: Not on file  ?Food Insecurity: Not on file  ?Transportation Needs: Not on file  ?Physical Activity: Not on file  ?Stress: Not on file  ?Social Connections: Not on file  ?Intimate Partner Violence: Not on file  ? ?Social History  ? ?Social History Narrative  ? Not on file  ? ? ? ?ROS: Negative.  ? ? ? ?PE: ?HEENT: Negative. ?Lungs: Clear. ?Cardio: RR. ? ?Assessment/Plan: ? ?Proceed with planned right breast  biopsy for atypical lobular hyperplasia. ? ?[name] ?08/28/2021 ? ? ?Assessment/Plan: ? ?Proceed with planned right breast biopsy for atypical lobular hyperplasia. ?

## 2021-08-28 NOTE — Transfer of Care (Signed)
Immediate Anesthesia Transfer of Care Note ? ?Patient: Carly Roberts ? ?Procedure(s) Performed: BREAST BIOPSY WITH NEEDLE LOCALIZATION (Right) ? ?Patient Location: PACU ? ?Anesthesia Type:General ? ?Level of Consciousness: awake and alert  ? ?Airway & Oxygen Therapy: Patient Spontanous Breathing and Patient connected to face mask oxygen ? ?Post-op Assessment: Report given to RN and Post -op Vital signs reviewed and stable ? ?Post vital signs: Reviewed and stable ? ?Last Vitals:  ?Vitals Value Taken Time  ?BP 131/55 08/28/21 1033  ?Temp    ?Pulse 90 08/28/21 1038  ?Resp 16 08/28/21 1038  ?SpO2 97 % 08/28/21 1038  ?Vitals shown include unvalidated device data. ? ?Last Pain:  ?Vitals:  ? 08/28/21 0858  ?TempSrc: Tympanic  ?   ? ?  ? ?Complications: No notable events documented. ?

## 2021-09-01 ENCOUNTER — Encounter: Payer: Self-pay | Admitting: General Surgery

## 2021-09-01 LAB — SURGICAL PATHOLOGY

## 2021-11-20 ENCOUNTER — Encounter: Payer: Self-pay | Admitting: Oncology

## 2021-11-20 ENCOUNTER — Telehealth: Payer: Self-pay | Admitting: Oncology

## 2021-11-20 NOTE — Telephone Encounter (Signed)
Please schedule patient for MD only at this end of this month. Please notify patient of appt. Thanks

## 2021-11-20 NOTE — Telephone Encounter (Signed)
LVM for pt to give [place] a call to be scheduled .Marland KitchenKJ

## 2021-11-24 ENCOUNTER — Inpatient Hospital Stay: Payer: BC Managed Care – PPO | Attending: Oncology | Admitting: Oncology

## 2021-11-24 ENCOUNTER — Encounter: Payer: Self-pay | Admitting: Oncology

## 2021-11-24 VITALS — BP 135/68 | HR 69 | Temp 97.3°F | Ht 62.0 in | Wt 128.0 lb

## 2021-11-24 DIAGNOSIS — Z780 Asymptomatic menopausal state: Secondary | ICD-10-CM

## 2021-11-24 DIAGNOSIS — R634 Abnormal weight loss: Secondary | ICD-10-CM | POA: Insufficient documentation

## 2021-11-24 DIAGNOSIS — N6099 Unspecified benign mammary dysplasia of unspecified breast: Secondary | ICD-10-CM | POA: Insufficient documentation

## 2021-11-24 DIAGNOSIS — C8298 Follicular lymphoma, unspecified, lymph nodes of multiple sites: Secondary | ICD-10-CM

## 2021-11-24 DIAGNOSIS — K52832 Lymphocytic colitis: Secondary | ICD-10-CM | POA: Diagnosis not present

## 2021-11-24 DIAGNOSIS — Z809 Family history of malignant neoplasm, unspecified: Secondary | ICD-10-CM

## 2021-11-24 DIAGNOSIS — L4050 Arthropathic psoriasis, unspecified: Secondary | ICD-10-CM | POA: Insufficient documentation

## 2021-11-24 DIAGNOSIS — Z800 Family history of malignant neoplasm of digestive organs: Secondary | ICD-10-CM | POA: Insufficient documentation

## 2021-11-24 DIAGNOSIS — I10 Essential (primary) hypertension: Secondary | ICD-10-CM | POA: Insufficient documentation

## 2021-11-24 DIAGNOSIS — C8201 Follicular lymphoma grade I, lymph nodes of head, face, and neck: Secondary | ICD-10-CM | POA: Diagnosis not present

## 2021-11-24 DIAGNOSIS — K219 Gastro-esophageal reflux disease without esophagitis: Secondary | ICD-10-CM | POA: Insufficient documentation

## 2021-11-24 DIAGNOSIS — N6021 Fibroadenosis of right breast: Secondary | ICD-10-CM | POA: Insufficient documentation

## 2021-11-24 DIAGNOSIS — Z801 Family history of malignant neoplasm of trachea, bronchus and lung: Secondary | ICD-10-CM | POA: Insufficient documentation

## 2021-11-24 DIAGNOSIS — Z808 Family history of malignant neoplasm of other organs or systems: Secondary | ICD-10-CM | POA: Insufficient documentation

## 2021-11-24 DIAGNOSIS — Z8051 Family history of malignant neoplasm of kidney: Secondary | ICD-10-CM | POA: Diagnosis not present

## 2021-11-24 NOTE — Progress Notes (Signed)
[facility] Follow Up Visit.   Patient Care Team: Rusty Aus, MD as PCP - General (Internal Medicine) Dalia Heading, CNM (Inactive) as Midwife (Certified Nurse Midwife) Bary Castilla, Forest Gleason, MD (General Surgery) Minna Merritts, MD as Consulting Physician (Cardiology)  REASON FOR VISIT Follow up for management of follicular lymphoma.    HISTORY OF PRESENTING ILLNESS: Carly Roberts 56 y.o. female who is referred by Dr.Oaks to [place] for evaluation and management of recently diagnosed follicular lymphoma. patientWas in her usual state of health until recently when she started to notice enlarging left neck mass she went to her primary care physician and ultrasound was done showing a few small lymph nodes. She continues to feel uncomfortableness in her neck and therefore a CT scan was performed. CT neck and chest with contrast on 12/08/2016 showed mild adenopathy in the left supraclavicular region and bilateral axillary/subpectoral small bilateral lower lobe nodules nonspecific. As followed by a PET scan on 12/10/2016 which showed pathologically enlarged Deauville 2 left supraclavicular and left subpectoral and axillary adenopathy in the chest. Pathologically enlarged nodes or retroperitoneal pelvic adenopathy in the abdomen. Normal size Deauville 2 scattered lymph nodes in the neck. Patient had ultrasound guided right inguinal node biopsy on 12/20/2016 and pathology showed follicular lymphoma, grade 1-2. KI 67 showed a low proliferation index 5%. Patient was referred to see me for discussion about treatment plan  # She was evaluate [facility] by Dr.Grover who recommends repeating image scan. to get an idea of the pace of progression of her disease. She does not otherwise clearly meet GELF criteria except for mild symptoms related to neck lymphadenopathy as well as some mild anxiety related to deferring treatment, which is also a possible indication for treatment. If her disease  burden continues to be relatively low with slow disease pace, it is reasonable to continue watchful waiting vs discussion of single agent rituximab as a low toxicity alternative. We also discussed common side effects associated with rituximab. If her disease burden has increased, another option for treatment would be rituximab-bendamustine which likely would offer a longer remission than single agent rituximab but is also a more aggressive treatment   # Colonoscopy on 04/06/2018 showed lymphocytic colitis and biopsies of the ascending colon and sigmoid.  She was prescribed on Entocort which she feels not helping with her symptoms, so she self discontinued.  07/22/2020 PET scan images were independently reviewed by me and discussed with patient Overall slight improvement of adenopathy with mild hypermetabolic pelvic and inguinal adenopathy.  Slight enlargement increase activity within node superiorly in the left groin.  Deauville 3  INTERVAL HISTORY Patient with history listed above reviewed by me present for follow-up for discussion of chemoprevention for atypical lobular hyperplasia.  She also follows up with me for follicular cell lymphoma. Patient has lost weight since last visit. 05/28/2021, bilateral screening mammogram showed right breast calcifications warrants further evaluation 06/16/2021, unilateral right diagnostic mammogram showed indeterminate grouped calcification spanning 0.7 cm in the upper outer right breast. 07/01/2021, right breast upper outer quadrant stereotactic biopsy showed atypical lobular hyperplasia.  Negative for ductal carcinoma in situ and malignancy. 08/28/2021, patient underwent right upper quadrant wire localized excision.  Pathology showed residual: No cell changes with calcifications.  Background breast with sclerosing adenosis, cyst formation and lobular neoplasm.  Negative for atypical ductal hyperplasia, ductal carcinoma in situ and invasive carcinoma. Dr. Bary Castilla  discussed with patient about chemoprevention.  Patient would like to further discuss  about her  options. Patient is postmenopausal, history of endometrial ablation, FSH 89.6. Denies any hormone replacement therapy. History of OCP use for 10 to 15 years   10/29/2021, patient establish care with rheumatology Dr. Posey Pronto for psoriatic arthritis.  She was recommended to start a course of prednisone and Otezla    Review of Systems  Constitutional:  Positive for malaise/fatigue. Negative for chills, fever and weight loss.       Night sweats  HENT:  Negative for congestion, ear discharge, ear pain, nosebleeds, sinus pain and sore throat.        Neck lymph nodes swelling, wax and wane.   Eyes:  Negative for double vision, photophobia, pain, discharge and redness.  Respiratory:  Negative for cough, hemoptysis, sputum production, shortness of breath and wheezing.   Cardiovascular:  Negative for chest pain, palpitations, orthopnea, claudication and leg swelling.  Gastrointestinal:  Negative for abdominal pain, blood in stool, constipation, heartburn, melena, nausea and vomiting.  Genitourinary:  Negative for dysuria, flank pain, frequency and hematuria.       Groin lymph node swelling   Musculoskeletal:  Negative for back pain and myalgias.  Skin:  Negative for itching and rash.  Neurological:  Negative for dizziness, tingling, tremors, focal weakness, weakness and headaches.  Endo/Heme/Allergies:  Negative for environmental allergies. Does not bruise/bleed easily.  Psychiatric/Behavioral:  Negative for depression, hallucinations, substance abuse and suicidal ideas. The patient is not nervous/anxious.     MEDICAL HISTORY: Past Medical History:  Diagnosis Date   Anemia    Anxiety and depression    Cancer ([place]) 2018   Non Hodgkin's follicular lymphoma   Chicken pox    Fibrocystic breast 2011   GERD (gastroesophageal reflux disease)    Heart murmur    History of mammogram 05/27/2015   [email or web address]    Hypertension    Lymphocytic colitis 04/2018   Dr. Vira Agar   Menorrhagia    Osteoarthritis    Uterine fibroid     SURGICAL HISTORY: Past Surgical History:  Procedure Laterality Date   ABLATION  2008   endometrial ablation   BREAST BIOPSY Right 07/01/2021   X Clip- path pending   BREAST BIOPSY Right 08/28/2021   Procedure: BREAST BIOPSY WITH NEEDLE LOCALIZATION;  Surgeon: [name], MD;  Location: [facility] ORS;  Service: General;  Laterality: Right;   BREAST CYST ASPIRATION Bilateral 12/31/2015   FNA done by Dr. Bary Castilla   BREAST EXCISIONAL BIOPSY Right 2005   Fibroadenoma, duct adenoma, right breast or o'clock.   COLONOSCOPY  08/25/2011   removed 3 mm polyp Dr. Vira Agar   COLONOSCOPY  04/2018   lymphocytic colitis   DILATION AND CURETTAGE OF UTERUS     HYSTEROSCOPY     LYMPH NODE BIOPSY  2018   TOTAL HIP ARTHROPLASTY Right 04/03/2020   Procedure: TOTAL HIP ARTHROPLASTY ANTERIOR APPROACH;  Surgeon: Hessie Knows, MD;  Location: [facility] ORS;  Service: Orthopedics;  Laterality: Right;   UTERINE FIBROID SURGERY  09/2019   WISDOM TOOTH EXTRACTION      SOCIAL HISTORY: Social History   Socioeconomic History   Marital status: Married    Spouse name: Ed   Number of children: 2   Years of education: 14   Highest education level: Not on file  Occupational History   Occupation: Armed forces training and education officer  Tobacco Use   Smoking status: Never   Smokeless tobacco: Never  Vaping Use   Vaping Use: Never used  Substance and Sexual Activity   Alcohol use: Yes    Alcohol/week:  0.0 standard drinks of alcohol    Comment: rare   Drug use: No   Sexual activity: Yes    Partners: Male    Birth control/protection: Surgical    Comment: vasectomy  Other Topics Concern   Not on file  Social History Narrative   Not on file   Social Determinants of Health   Financial Resource Strain: Not on file  Food Insecurity: Not on file  Transportation Needs: Not on file  Physical Activity:  Not on file  Stress: Not on file  Social Connections: Not on file  Intimate Partner Violence: Not on file    FAMILY HISTORY Family History  Problem Relation Age of Onset   Multiple sclerosis Mother    Hypothyroidism Mother    Diverticulosis Mother        intestinal obstruction   Hyperlipidemia Father    Heart disease Father        Had MI 2016   Hypertension Father    Skin cancer Father    Lung cancer Maternal Aunt        62s   Alcohol abuse Maternal Uncle    Lung cancer Maternal Uncle        25s   Cancer Maternal Uncle 60       kidney   Stroke Maternal Grandmother    Hypertension Maternal Grandmother    Kidney disease Maternal Grandmother        had a kidney removed   Diabetes Maternal Grandmother    Hypertension Paternal Grandfather    Colon cancer Paternal Grandfather    Autoimmune disease Sister        alopecia    Prostate cancer Maternal Grandfather    Colon cancer Maternal Uncle    Breast cancer Neg Hx     ALLERGIES:  is allergic to acrylic polymer [carbomer], merthiolate [thimerosal], sulfa antibiotics, and sulfasalazine.  MEDICATIONS:  Current Outpatient Medications  Medication Sig Dispense Refill   Apremilast (OTEZLA) 10 & 20 & 30 MG TBPK Take by mouth.     gabapentin (NEURONTIN) 300 MG capsule Take 300 mg by mouth at bedtime.     HYDROcodone-acetaminophen (NORCO/VICODIN) 5-325 MG tablet Take 1 tablet by mouth every 4 (four) hours as needed for moderate pain. 20 tablet 0   meloxicam (MOBIC) 15 MG tablet Take 15 mg by mouth daily as needed (arthritis).     pantoprazole (PROTONIX) 20 MG tablet Take 20 mg by mouth in the morning.     Cholecalciferol (VITAMIN D3) 25 MCG (1000 UT) CAPS Take 1,000 Units by mouth every evening. (Patient not taking: Reported on 11/24/2021)     No current facility-administered medications for this visit.    PHYSICAL EXAMINATION:  ECOG PERFORMANCE STATUS: 0 - Asymptomatic  Vitals:   11/24/21 1422  BP: 135/68  Pulse: 69   Temp: (!) 97.3 F (36.3 C)    Filed Weights   11/24/21 1422  Weight: 128 lb (58.1 kg)     Physical Exam Constitutional:      General: She is not in acute distress.    Appearance: She is not diaphoretic.  HENT:     Head: Normocephalic and atraumatic.     Nose: Nose normal.     Mouth/Throat:     Pharynx: No oropharyngeal exudate.  Eyes:     General: No scleral icterus.       Left eye: No discharge.     Conjunctiva/sclera: Conjunctivae normal.     Pupils: Pupils are equal, round, and reactive to  light.  Neck:     Vascular: No JVD.     Comments:  Small soft cervical lymph nodes bilateral, left supraclavicular node (+), appears more prominent.  Cardiovascular:     Rate and Rhythm: Normal rate and regular rhythm.     Heart sounds: Normal heart sounds. No murmur heard. Pulmonary:     Effort: Pulmonary effort is normal. No respiratory distress.     Breath sounds: Normal breath sounds. No wheezing or rales.  Chest:     Chest wall: No tenderness.  Abdominal:     General: Bowel sounds are normal. There is no distension.     Palpations: Abdomen is soft. There is no mass.     Tenderness: There is no abdominal tenderness. There is no rebound.  Musculoskeletal:        General: No tenderness. Normal range of motion.     Cervical back: Normal range of motion and neck supple.  Lymphadenopathy:     Cervical: No cervical adenopathy.  Skin:    General: Skin is warm and dry.     Findings: No erythema or rash.  Neurological:     Mental Status: She is alert and oriented to person, place, and time.     Cranial Nerves: No cranial nerve deficit.     Motor: No abnormal muscle tone.     Coordination: Coordination normal.  Psychiatric:        Mood and Affect: Affect normal.        Judgment: Judgment normal.     LABORATORY DATA: I have personally reviewed the data as listed:    Latest Ref Rng & Units 06/26/2021   11:41 AM 07/21/2020    3:21 PM 04/05/2020    6:26 AM  CBC  WBC 4.0 -  10.5 K/uL 5.7  6.2  7.8   Hemoglobin 12.0 - 15.0 g/dL 12.5  12.7  10.0   Hematocrit 36.0 - 46.0 % 38.0  38.3  29.9   Platelets 150 - 400 K/uL 265  303  190       Latest Ref Rng & Units 06/26/2021   11:41 AM 07/21/2020    3:21 PM 04/04/2020    3:29 AM  CMP  Glucose 70 - 99 mg/dL 103  86  106   BUN 6 - 20 mg/dL $Remove'17  18  10   'wscXybw$ Creatinine 0.44 - 1.00 mg/dL 0.98  0.88  0.80   Sodium 135 - 145 mmol/L 137  137  136   Potassium 3.5 - 5.1 mmol/L 4.3  3.7  3.8   Chloride 98 - 111 mmol/L 101  102  104   CO2 22 - 32 mmol/L $RemoveB'27  25  25   'TNgHvDCb$ Calcium 8.9 - 10.3 mg/dL 9.6  9.3  8.5   Total Protein 6.5 - 8.1 g/dL 7.3  7.5    Total Bilirubin 0.3 - 1.2 mg/dL 0.3  0.6    Alkaline Phos 38 - 126 U/L 65  55    AST 15 - 41 U/L 21  19    ALT 0 - 44 U/L 20  15       Surgical Pathology 12/20/2016 CASE: [id number]  PATIENT: Althea LONG  Surgical Pathology Report DIAGNOSIS:  A.  LYMPH NODE, RIGHT GROIN; NEEDLE CORE BIOPSY:  - FOLLICULAR LYMPHOMA, GRADE 1-2, SEE COMMENT.   Comment: A sample was sent for flow cytometry ([employer], Accession [id number]) with the following interpretation: CD10+ clonal B-cell population detected.  The case was sent to [facility] for consultation.  The findings of the external consultant Dr. Angelena Form are incorporated above.  This is the comment of the external consultant: Thank you for the opportunity to review this case.  HE sections demonstrate small core needle biopsy tissue fragments with a dense lymphoid proliferation with a vaguely follicular architecture.  The lymphocytes are composed of small mature-appearing forms.  No significant population of large lymphocytes is detected. Immunohistochemical stains are performed and evaluated to further characterize this lymphoid proliferation.  CD3 marks T cells and negatively highlights B-cell rich follicles.  CD20 marks numerous B-cell rich follicles, as well as numerous interfollicular B  cells.  The B cells are positive for CD10, BCL 6 and BCL-2.  CD5 marks T cells without overt coexpression in B cells, while a cyclin D1 immunostain is negative in lymphocytes.  PJ09 highlights follicular dendritic cell mesh works underlying most of the follicles.  Ki-67 showed a low proliferation index (5%).  Per report, flow cytometric analysis reveals a CD10 positive clonal B-cell population.  For details, see [facility] report.  The  morphologic and immunophenotypic findings support a diagnosis of follicular lymphoma, grade 1-2, with a follicular pattern in this small submitted sample.  Selected slides from this case were reviewed and discussed at our hematopathology consensus conference on 12/23/16.      RADIOGRAPHIC STUDIES: I have personally reviewed the radiological images as listed and agree with the findings in the report MM Breast Surgical Specimen  Result Date: 08/28/2021 CLINICAL DATA:  Status post right breast surgical excision. EXAM: SPECIMEN RADIOGRAPH OF THE right BREAST COMPARISON:  Previous exam(s). FINDINGS: Status post excision of the right breast. The wire tip and X biopsy marker clip are present and are marked for pathology. IMPRESSION: Specimen radiograph of the right breast. Electronically Signed   By: Abelardo Diesel M.D.   On: 08/28/2021 10:45  MM RT PLC BREAST LOC DEV   1ST LESION  INC MAMMO GUIDE  Result Date: 08/28/2021 CLINICAL DATA:  Right breast atypical lobular hyperplasia for needle localization prior to surgical excision. EXAM: NEEDLE LOCALIZATION OF THE right BREAST WITH MAMMO GUIDANCE COMPARISON:  Previous exams. PROCEDURE: Patient presents for needle localization prior to right breast surgery. I met with the patient and we discussed the procedure of needle localization including benefits and alternatives. We discussed the high likelihood of a successful procedure. We discussed the risks of the procedure, including infection, bleeding, tissue injury, and further  surgery. Informed, written consent was given. The usual time-out protocol was performed immediately prior to the procedure. Using mammographic guidance, sterile technique, 1% lidocaine and a 7 cm modified Kopans needle, X biopsy clip localized using lateral approach. The images were marked for Dr. Bary Castilla. IMPRESSION: Needle localization right breast. No apparent complications. Electronically Signed   By: Abelardo Diesel M.D.   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 Atypical lobular hyperplasia (ALH) of breast   2. Postmenopausal   3. 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 Psoriatic arthritis ([place])   5. Family history of cancer    Cancer Staging  Follicular lymphoma  of lymph nodes of multiple regions [facility]) Staging form: Hodgkin and Non-Hodgkin Lymphoma, AJCC 8th Edition - Clinical stage from 9/89/2119: Stage III (Follicular lymphoma) - Signed by Earlie Server, MD on 12/27/2016  #Atypical lobular hyperplasia, lobular neoplasia Pathology report was reviewed and discussed with patient Discussed with patient that [facility] is a high risk lesion, associated with increased risk of breast cancer. Options of chemo prevention were reviewed with the patient. Tamoxifen versus aromatase inhibitor Patient may have increased thrombosis risk due to her stage IV follicular lymphoma. Aromatase inhibitor may be better option for her Rationale and potential side effects of aromatase inhibitor was reviewed and discussed with patient Patient is undecided would like to consider  and update me if she decides to proceed with chemoprevention.  #Postmenopausal state, recommend baseline bone density  Recommend calcium and vitamin D supplementation.  #Family history of cancer.  Refer to Dietitian.  #Follicular lymphoma,  Currently on watchful waiting.   Keep current scheduled appointment.   Orders Placed This Encounter  Procedures   DG Bone Density    Standing Status:   Future    Standing Expiration Date:   11/25/2022    Order Specific Question:   Reason for Exam (SYMPTOM  OR DIAGNOSIS REQUIRED)    Answer:   post menopausal state, Atypical lobular hyperplasia    Order Specific Question:   Is the patient pregnant?    Answer:   No    Order Specific Question:   Preferred imaging location?    Answer:   [place]   Ambulatory referral to Genetics    Referral Priority:   Routine    Referral Type:   Consultation    Referral Reason:   Specialty Services Required    Number of Visits Requested:   1   We spent sufficient time to discuss many aspect of care, questions were answered to patient's satisfaction.    Earlie Server, MD, PhD Hematology Oncology  11/24/2021

## 2021-12-16 ENCOUNTER — Inpatient Hospital Stay: Payer: BC Managed Care – PPO

## 2021-12-16 ENCOUNTER — Encounter: Payer: Self-pay | Admitting: Licensed Clinical Social Worker

## 2021-12-16 ENCOUNTER — Inpatient Hospital Stay: Payer: BC Managed Care – PPO | Attending: Oncology | Admitting: Licensed Clinical Social Worker

## 2021-12-16 DIAGNOSIS — Z801 Family history of malignant neoplasm of trachea, bronchus and lung: Secondary | ICD-10-CM

## 2021-12-16 DIAGNOSIS — C8298 Follicular lymphoma, unspecified, lymph nodes of multiple sites: Secondary | ICD-10-CM | POA: Diagnosis not present

## 2021-12-16 DIAGNOSIS — Z8052 Family history of malignant neoplasm of bladder: Secondary | ICD-10-CM

## 2021-12-16 DIAGNOSIS — Z800 Family history of malignant neoplasm of digestive organs: Secondary | ICD-10-CM

## 2021-12-16 DIAGNOSIS — N6099 Unspecified benign mammary dysplasia of unspecified breast: Secondary | ICD-10-CM

## 2021-12-16 DIAGNOSIS — Z8042 Family history of malignant neoplasm of prostate: Secondary | ICD-10-CM

## 2021-12-16 NOTE — Progress Notes (Signed)
REFERRING PROVIDER: Earlie Server, MD [place],  [place] [address]  PRIMARY PROVIDER:  Rusty Aus, MD  PRIMARY REASON FOR VISIT:  1. 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 Atypical lobular hyperplasia (ALH) of breast   3. Family history of prostate cancer   4. Family history of colon cancer   5. Family history of lung cancer   6. Family history of stomach cancer   7. Family history of bladder cancer      HISTORY OF PRESENT ILLNESS:   Carly Roberts, a 56 y.o. female, was seen for a [facility] cancer genetics consultation at the request of Dr. Tasia Catchings due to a personal and family history of cancer.  Carly Roberts presents to clinic today to discuss the possibility of a hereditary predisposition to cancer, genetic testing, and to further clarify her future cancer risks, as well as potential cancer risks for family members.   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  RISK FACTORS:  Menarche was at age 2-14.  First live birth at age 5.   Ovaries intact: yes.  Hysterectomy: no.  Menopausal status: postmenopausal.  Colonoscopy: yes;  1 polyps . Mammogram within the last year: yes. Number of breast biopsies: 2. - atypical lobular hyperplasia on 2023 bx Up to date with pelvic exams: yes.  Past Medical History:  Diagnosis Date   Anemia    Anxiety and depression    Cancer ([place]) 2018   Non Hodgkin's follicular lymphoma   Chicken pox    Fibrocystic breast 2011   GERD (gastroesophageal reflux disease)    Heart murmur    History of mammogram 05/27/2015   [id number]   Hypertension    Lymphocytic colitis 04/2018   Dr. Vira Agar   Menorrhagia    Osteoarthritis    Uterine fibroid     Past Surgical History:  Procedure Laterality Date   ABLATION  2008   endometrial ablation   BREAST BIOPSY Right 07/01/2021   X Clip- path pending    BREAST BIOPSY Right 08/28/2021   Procedure: BREAST BIOPSY WITH NEEDLE LOCALIZATION;  Surgeon: [name], MD;  Location: [facility] ORS;  Service: General;  Laterality: Right;   BREAST CYST ASPIRATION Bilateral 12/31/2015   FNA done by Dr. Bary Castilla   BREAST EXCISIONAL BIOPSY Right 2005   Fibroadenoma, duct adenoma, right breast or o'clock.   COLONOSCOPY  08/25/2011   removed 3 mm polyp Dr. Vira Agar   COLONOSCOPY  04/2018   lymphocytic colitis   DILATION AND CURETTAGE OF UTERUS     HYSTEROSCOPY     LYMPH NODE BIOPSY  2018   TOTAL HIP ARTHROPLASTY Right 04/03/2020   Procedure: TOTAL HIP ARTHROPLASTY ANTERIOR APPROACH;  Surgeon: Hessie Knows, MD;  Location: [facility] ORS;  Service: Orthopedics;  Laterality: Right;   UTERINE FIBROID SURGERY  09/2019   WISDOM TOOTH EXTRACTION      FAMILY HISTORY:  We obtained a detailed, 4-generation family history.  Significant diagnoses are listed below: Family History  Problem Relation Age of Onset   Multiple sclerosis Mother    Hypothyroidism Mother    Diverticulosis Mother        intestinal obstruction   Hyperlipidemia Father    Heart disease Father        Had MI 2016   Hypertension Father    Skin cancer Father    Autoimmune disease Sister  alopecia    Lung cancer Maternal Aunt        60s   Alcohol abuse Maternal Uncle    Lung cancer Maternal Uncle        29s   Colon cancer Maternal Uncle    Stroke Maternal Grandmother    Hypertension Maternal Grandmother    Kidney disease Maternal Grandmother        had a kidney removed   Diabetes Maternal Grandmother    Prostate cancer Maternal Grandfather    Hypertension Paternal Grandfather    Stomach cancer Paternal Grandfather    Bladder Cancer Paternal Grandfather    Breast cancer Neg Hx    Carly Roberts has 1 son, 7, 1 daughter, 42. She has 1 brother, 88, 1 sister, 55.   Carly Roberts mother is living at 22. Patient has 2 maternal aunts who had lung cancer, and a maternal uncle who had  lung cancer, all had history of smoking. An aunt had cervical cancer early in life. An uncle had colon cancer. Maternal grandfather had prostate cancer at 53.   Carly Roberts father has had skin cancer and is living at 24. Paternal grandfather had bladder, stomach and skin cancer in his 39s and died at 26.   Carly Roberts is unaware of previous family history of genetic testing for hereditary cancer risks. There is no reported Ashkenazi Jewish ancestry. There is no known consanguinity.   GENETIC COUNSELING ASSESSMENT: Carly Roberts is a 56 y.o. female with a personal and family history which is somewhat suggestive of a hereditary cancer syndrome and predisposition to cancer. We, therefore, discussed and recommended the following at today's visit.   DISCUSSION: We discussed that approximately 10% of cancer cancer is hereditary. We discussed breast cancer genes such as BRCA1/BRCA2 since she is hoping to find out more about her breast cancer risk as it pertains to chemoprevention. There are other genes associated with hereditary cancer as well. Cancers and risks are gene specific. We discussed that testing is beneficial for several reasons including knowing about cancer risks, identifying potential screening and risk-reduction options that may be appropriate, and to understand if other family members could be at risk for cancer and allow them to undergo genetic testing.   We reviewed the characteristics, features and inheritance patterns of hereditary cancer syndromes. We also discussed genetic testing, including the appropriate family members to test, the process of testing, insurance coverage and turn-around-time for results. We discussed the implications of a negative, positive and/or variant of uncertain significant result. We discussed that she does not meet medical criteria for genetic testing, but could still pursue testing if she wants the information. She may have an out of pocket cost of $250. We  recommended Carly Roberts pursue genetic testing for the [employer] CancerNext-Expanded+RNA gene panel.   PLAN: After considering the risks, benefits, and limitations, Carly Roberts provided informed consent to pursue genetic testing and the blood sample was sent to [facility] for analysis of the CancerNext-Expanded+RNA panel. Results should be available within approximately 2-3 weeks' time, at which point they will be disclosed by telephone to Carly Roberts, as will any additional recommendations warranted by these results. Carly Roberts will receive a summary of her genetic counseling visit and a copy of her results once available. This information will also be available in Epic.   Carly Roberts questions were answered to her satisfaction today. Our contact information was provided should additional questions or concerns arise. Thank you for the referral and allowing [place] to  share in the care of your patient.   Faith Rogue, MS, [facility] Genetic Counselor [email or web address] Phone: [phone]  The patient was seen for a total of 25 minutes in face-to-face genetic counseling. Patient's husband was also present. Dr. Grayland Ormond was available for discussion regarding this case.   _______________________________________________________________________ For Office Staff:  Number of people involved in session: 2 Was an Intern/ student involved with case: no ]

## 2022-01-05 ENCOUNTER — Ambulatory Visit
Admission: RE | Admit: 2022-01-05 | Discharge: 2022-01-05 | Disposition: A | Discharge status: Home / Self Care | Payer: BC Managed Care – PPO | Source: Ambulatory Visit | Attending: Oncology | Admitting: Oncology

## 2022-01-05 DIAGNOSIS — Z780 Asymptomatic menopausal state: Secondary | ICD-10-CM | POA: Diagnosis present

## 2022-01-12 ENCOUNTER — Ambulatory Visit: Payer: Self-pay | Admitting: Licensed Clinical Social Worker

## 2022-01-12 ENCOUNTER — Encounter: Payer: Self-pay | Admitting: Licensed Clinical Social Worker

## 2022-01-12 ENCOUNTER — Telehealth: Payer: Self-pay

## 2022-01-12 ENCOUNTER — Telehealth: Payer: Self-pay | Admitting: Licensed Clinical Social Worker

## 2022-01-12 DIAGNOSIS — Z1379 Encounter for other screening for genetic and chromosomal anomalies: Secondary | ICD-10-CM | POA: Insufficient documentation

## 2022-01-12 NOTE — Telephone Encounter (Signed)
Patient verbalized understanding of the recommendations for calcium and vitamin D supplements.   Patient states she had some genetics testing done and just received the report from that and based on both reports she wants to OPT out of the Tamoxifen and aromatase inhibitor. Patient states there is no risk genetically for breast cancer.

## 2022-01-12 NOTE — Progress Notes (Signed)
HPI:   Carly Roberts was previously seen in the [place] clinic due to a personal and family history of cancer and concerns regarding a hereditary predisposition to cancer. Please refer to our prior cancer genetics clinic note for more information regarding our discussion, assessment and recommendations, at the time. Carly Roberts recent genetic test results were disclosed to her, as were recommendations warranted by these results. These results and recommendations are discussed in more detail below.  CANCER HISTORY:  Oncology History   No history exists.    FAMILY HISTORY:  We obtained a detailed, 4-generation family history.  Significant diagnoses are listed below: Family History  Problem Relation Age of Onset   Multiple sclerosis Mother    Hypothyroidism Mother    Diverticulosis Mother        intestinal obstruction   Hyperlipidemia Father    Heart disease Father        Had MI 2016   Hypertension Father    Skin cancer Father    Autoimmune disease Sister        alopecia    Lung cancer Maternal Aunt        31s   Alcohol abuse Maternal Uncle    Lung cancer Maternal Uncle        41s   Colon cancer Maternal Uncle    Stroke Maternal Grandmother    Hypertension Maternal Grandmother    Kidney disease Maternal Grandmother        had a kidney removed   Diabetes Maternal Grandmother    Prostate cancer Maternal Grandfather    Hypertension Paternal Grandfather    Stomach cancer Paternal Grandfather    Bladder Cancer Paternal Grandfather    Breast cancer Neg Hx    Carly Roberts has 1 son, 85, 1 daughter, 70. She has 1 brother, 81, 1 sister, 54.    Carly Roberts mother is living at 35. Patient has 2 maternal aunts who had lung cancer, and a maternal uncle who had lung cancer, all had history of smoking. An aunt had cervical cancer early in life. An uncle had colon cancer. Maternal grandfather had prostate cancer at 43.    Carly Roberts father has had skin cancer and is  living at 51. Paternal grandfather had bladder, stomach and skin cancer in his 75s and died at 57.    Carly Roberts is unaware of previous family history of genetic testing for hereditary cancer risks. There is no reported Ashkenazi Jewish ancestry. There is no known consanguinity.      GENETIC TEST RESULTS:  The [employer] CancerNext-Expanded+RNA Panel found no pathogenic mutations.   The CancerNext-Expanded + RNAinsight gene panel offered by [employer] and includes sequencing and rearrangement analysis for the following 77 genes: IP, ALK, APC*, ATM*, AXIN2, BAP1, BARD1, BLM, BMPR1A, BRCA1*, BRCA2*, BRIP1*, CDC73, CDH1*,CDK4, CDKN1B, CDKN2A, CHEK2*, CTNNA1, DICER1, FANCC, FH, FLCN, GALNT12, KIF1B, LZTR1, MAX, MEN1, MET, MLH1*, MSH2*, MSH3, MSH6*, MUTYH*, NBN, NF1*, NF2, NTHL1, PALB2*, PHOX2B, PMS2*, POT1, PRKAR1A, PTCH1, PTEN*, RAD51C*, RAD51D*,RB1, RECQL, RET, SDHA, SDHAF2, SDHB, SDHC, SDHD, SMAD4, SMARCA4, SMARCB1, SMARCE1, STK11, SUFU, TMEM127, TP53*,TSC1, TSC2, VHL and XRCC2 (sequencing and deletion/duplication); EGFR, EGLN1, HOXB13, KIT, MITF, PDGFRA, POLD1 and POLE (sequencing only); EPCAM and GREM1 (deletion/duplication only).   The test report has been scanned into EPIC and is located under the Molecular Pathology section of the Results Review tab.  A portion of the result report is included below for reference. Genetic testing reported out on 01/07/2022.  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  At this time, it is unknown if this variant is associated with an increased risk for cancer or if it is benign, but most uncertain variants are reclassified to benign. It should not be used to make medical management decisions. With time, we suspect the laboratory will determine the significance of this variant, if any. If the laboratory reclassifies this variant, we will attempt to contact Carly Roberts to discuss it further.   Even  though a pathogenic variant was not identified, possible explanations for the cancer in the family may include: There may be no hereditary risk for cancer in the family. The cancers in Carly Roberts and/or her family may be sporadic/familial or due to other genetic and environmental factors. There may be a gene mutation in one of these genes that current testing methods cannot detect but that chance is small. There could be another gene that has not yet been discovered, or that we have not yet tested, that is responsible for the cancer diagnoses in the family.  It is also possible there is a hereditary cause for the cancer in the family that Carly Roberts did not inherit. The variant of uncertain significance detected in the BLM gene may be reclassified as a pathogenic variant in the future. At this time, we do not know if this variant increases the risk for cancer.  Therefore, it is important to remain in touch with cancer genetics in the future so that we can continue to offer Carly Roberts the most up to date genetic testing.   ADDITIONAL GENETIC TESTING:  We discussed with Carly Roberts that her genetic testing was fairly extensive.  If there are additional relevant genes identified to increase cancer risk that can be analyzed in the future, we would be happy to discuss and coordinate this testing at that time.    CANCER SCREENING RECOMMENDATIONS:  Carly Roberts test result is considered negative (normal).  This means that we have not identified a hereditary cause for her personal and family history of cancer at this time.   Breast Cancer Screening:  The Tyrer-Cuzick model is one of multiple prediction models developed to estimate an individual's lifetime risk of developing breast cancer. The Tyrer-Cuzick model is endorsed by the [employer]  (NCCN). This model includes many risk factors such as family history, endogenous estrogen exposure, and benign breast disease. The  calculation is highly-dependent on the accuracy of clinical data provided by the patient and can change over time. The Tyrer-Cuzick model may be repeated to reflect new information in her personal or family history in the future.    Carly Roberts Tyrer-Cuzick risk score is 31%.  For women with a greater than 20% lifetime risk of breast cancer, the NCCN recommends the following:    1.   Clinical encounter every 6-12 months to begin when identified as being at increased risk, but not before age 36    2.   Annual mammograms, tomosynthesis is recommended starting 10 years earlier than the youngest breast cancer diagnosis in the family or at age 81 (whichever comes first), but not before age 35     3.   Annual breast MRI starting 10 years earlier than the youngest breast cancer diagnosis in the family or at age 30 (whichever comes first), but not before age 83   Ms. Inabinet is followed in the high risk clinic by Dr. Tasia Catchings.  RECOMMENDATIONS FOR FAMILY MEMBERS:   Since she did not inherit a identifiable mutation in a cancer predisposition gene included on this panel, her children could not have inherited a known mutation from her in one of these genes. Individuals in this family might be at some increased risk of developing cancer, over the general population risk, due to the family history of cancer.  Individuals in the family should notify their providers of the family history of cancer. We recommend women in this family have a yearly mammogram beginning at age 1, or 33 years younger than the earliest onset of cancer, an annual clinical breast exam, and perform monthly breast self-exams.  Family members should have colonoscopies by at age 40, or earlier, as recommended by their providers. We do not recommend familial testing for the BLM variant of uncertain significance (VUS).  FOLLOW-UP:  Lastly, we discussed with Ms. Cowens that cancer genetics is a rapidly advancing field and it is possible that  new genetic tests will be appropriate for her and/or her family members in the future. We encouraged her to remain in contact with cancer genetics on an annual basis so we can update her personal and family histories and let her know of advances in cancer genetics that may benefit this family.   Our contact number was provided. Ms. Shoe questions were answered to her satisfaction, and she knows she is welcome to call us at anytime with additional questions or concerns.    Faith Rogue, MS, [facility] Genetic Counselor [email or web address] Phone: [phone]

## 2022-01-12 NOTE — Telephone Encounter (Signed)
I contacted Ms. Nickolas to discuss her genetic testing results. No pathogenic variants were identified in the 77 genes analyzed. Detailed clinic note to follow.   The test report has been scanned into EPIC and is located under the Molecular Pathology section of the Results Review tab.  A portion of the result report is included below for reference.      Faith Rogue, MS, [facility] Genetic Counselor [email or web address] Phone: [phone]

## 2022-01-12 NOTE — Telephone Encounter (Signed)
-----   Message from Earlie Server, MD sent at 01/11/2022 11:53 PM EDT ----- Please let patient know that DEXA showed Osteopenia,. Recommend calcium and vitamin D supplementation. I have not heard from her about her decision of whether to proceed with Tamoxifen or aromatase inhibitor. Please ask.  Keep current follow up

## 2022-01-13 ENCOUNTER — Encounter: Payer: Self-pay | Admitting: Licensed Clinical Social Worker

## 2022-06-21 ENCOUNTER — Other Ambulatory Visit: Payer: Self-pay | Admitting: Obstetrics

## 2022-06-21 DIAGNOSIS — Z1231 Encounter for screening mammogram for malignant neoplasm of breast: Secondary | ICD-10-CM

## 2022-06-23 ENCOUNTER — Inpatient Hospital Stay: Payer: BC Managed Care – PPO | Attending: Oncology

## 2022-06-23 ENCOUNTER — Other Ambulatory Visit: Payer: Self-pay

## 2022-06-23 ENCOUNTER — Other Ambulatory Visit: Payer: Self-pay | Admitting: Obstetrics

## 2022-06-23 ENCOUNTER — Telehealth: Payer: Self-pay

## 2022-06-23 DIAGNOSIS — I10 Essential (primary) hypertension: Secondary | ICD-10-CM | POA: Insufficient documentation

## 2022-06-23 DIAGNOSIS — E039 Hypothyroidism, unspecified: Secondary | ICD-10-CM | POA: Insufficient documentation

## 2022-06-23 DIAGNOSIS — R011 Cardiac murmur, unspecified: Secondary | ICD-10-CM | POA: Insufficient documentation

## 2022-06-23 DIAGNOSIS — N6001 Solitary cyst of right breast: Secondary | ICD-10-CM

## 2022-06-23 DIAGNOSIS — Z800 Family history of malignant neoplasm of digestive organs: Secondary | ICD-10-CM | POA: Diagnosis not present

## 2022-06-23 DIAGNOSIS — N6021 Fibroadenosis of right breast: Secondary | ICD-10-CM | POA: Insufficient documentation

## 2022-06-23 DIAGNOSIS — L4050 Arthropathic psoriasis, unspecified: Secondary | ICD-10-CM | POA: Insufficient documentation

## 2022-06-23 DIAGNOSIS — K219 Gastro-esophageal reflux disease without esophagitis: Secondary | ICD-10-CM | POA: Insufficient documentation

## 2022-06-23 DIAGNOSIS — C8298 Follicular lymphoma, unspecified, lymph nodes of multiple sites: Secondary | ICD-10-CM

## 2022-06-23 DIAGNOSIS — C8200 Follicular lymphoma grade I, unspecified site: Secondary | ICD-10-CM | POA: Diagnosis present

## 2022-06-23 DIAGNOSIS — M8580 Other specified disorders of bone density and structure, unspecified site: Secondary | ICD-10-CM | POA: Insufficient documentation

## 2022-06-23 DIAGNOSIS — Z801 Family history of malignant neoplasm of trachea, bronchus and lung: Secondary | ICD-10-CM | POA: Insufficient documentation

## 2022-06-23 DIAGNOSIS — E785 Hyperlipidemia, unspecified: Secondary | ICD-10-CM | POA: Insufficient documentation

## 2022-06-23 DIAGNOSIS — M1990 Unspecified osteoarthritis, unspecified site: Secondary | ICD-10-CM | POA: Diagnosis not present

## 2022-06-23 DIAGNOSIS — Z1231 Encounter for screening mammogram for malignant neoplasm of breast: Secondary | ICD-10-CM

## 2022-06-23 LAB — CBC WITH DIFFERENTIAL/PLATELET
Abs Immature Granulocytes: 0.02 10*3/uL (ref 0.00–0.07)
Basophils Absolute: 0 10*3/uL (ref 0.0–0.1)
Basophils Relative: 1 %
Eosinophils Absolute: 0.2 10*3/uL (ref 0.0–0.5)
Eosinophils Relative: 4 %
HCT: 37.1 % (ref 36.0–46.0)
Hemoglobin: 12 g/dL (ref 12.0–15.0)
Immature Granulocytes: 0 %
Lymphocytes Relative: 25 %
Lymphs Abs: 1.4 10*3/uL (ref 0.7–4.0)
MCH: 28 pg (ref 26.0–34.0)
MCHC: 32.3 g/dL (ref 30.0–36.0)
MCV: 86.5 fL (ref 80.0–100.0)
Monocytes Absolute: 0.5 10*3/uL (ref 0.1–1.0)
Monocytes Relative: 9 %
Neutro Abs: 3.3 10*3/uL (ref 1.7–7.7)
Neutrophils Relative %: 61 %
Platelets: 253 10*3/uL (ref 150–400)
RBC: 4.29 MIL/uL (ref 3.87–5.11)
RDW: 12.6 % (ref 11.5–15.5)
WBC: 5.4 10*3/uL (ref 4.0–10.5)
nRBC: 0 % (ref 0.0–0.2)

## 2022-06-23 LAB — COMPREHENSIVE METABOLIC PANEL
ALT: 16 U/L (ref 0–44)
AST: 20 U/L (ref 15–41)
Albumin: 4.1 g/dL (ref 3.5–5.0)
Alkaline Phosphatase: 66 U/L (ref 38–126)
Anion gap: 10 (ref 5–15)
BUN: 14 mg/dL (ref 6–20)
CO2: 26 mmol/L (ref 22–32)
Calcium: 9.2 mg/dL (ref 8.9–10.3)
Chloride: 100 mmol/L (ref 98–111)
Creatinine, Ser: 0.86 mg/dL (ref 0.44–1.00)
GFR, Estimated: >60 mL/min (ref >60)
Glucose, Bld: 91 mg/dL (ref 70–99)
Potassium: 3.6 mmol/L (ref 3.5–5.1)
Sodium: 136 mmol/L (ref 135–145)
Total Bilirubin: 0.4 mg/dL (ref 0.3–1.2)
Total Protein: 6.9 g/dL (ref 6.5–8.1)

## 2022-06-23 LAB — LACTATE DEHYDROGENASE: LDH: 141 U/L (ref 98–192)

## 2022-06-23 LAB — FERRITIN: Ferritin: 65 ng/mL (ref 11–307)

## 2022-06-23 LAB — TSH: TSH: 1.331 u[IU]/mL (ref 0.350–4.500)

## 2022-06-23 NOTE — Telephone Encounter (Signed)
Pt in clinic for labs today. States that Dallie Dad, [place] from [facility] requested ferritin and TSH be drawn. Labs collected and results faxed to [place] office.

## 2022-06-24 ENCOUNTER — Ambulatory Visit
Admission: RE | Admit: 2022-06-24 | Discharge: 2022-06-24 | Disposition: A | Discharge status: Home / Self Care | Payer: BC Managed Care – PPO | Source: Ambulatory Visit | Attending: Oncology | Admitting: Oncology

## 2022-06-24 DIAGNOSIS — C8298 Follicular lymphoma, unspecified, lymph nodes of multiple sites: Secondary | ICD-10-CM | POA: Insufficient documentation

## 2022-06-24 DIAGNOSIS — R590 Localized enlarged lymph nodes: Secondary | ICD-10-CM | POA: Insufficient documentation

## 2022-06-24 DIAGNOSIS — Z96641 Presence of right artificial hip joint: Secondary | ICD-10-CM | POA: Insufficient documentation

## 2022-06-24 LAB — GLUCOSE, CAPILLARY: Glucose-Capillary: 93 mg/dL (ref 70–99)

## 2022-06-24 MED ORDER — FLUDEOXYGLUCOSE F - 18 (FDG) INJECTION
6.6000 | Freq: Once | INTRAVENOUS | Status: AC | PRN
  Administered 2022-06-24: 6.96 via INTRAVENOUS

## 2022-06-28 ENCOUNTER — Inpatient Hospital Stay (HOSPITAL_BASED_OUTPATIENT_CLINIC_OR_DEPARTMENT_OTHER): Payer: BC Managed Care – PPO | Admitting: Oncology

## 2022-06-28 ENCOUNTER — Encounter: Payer: Self-pay | Admitting: Oncology

## 2022-06-28 VITALS — BP 123/55 | HR 71 | Temp 98.3°F | Resp 16 | Wt 127.7 lb

## 2022-06-28 DIAGNOSIS — C8298 Follicular lymphoma, unspecified, lymph nodes of multiple sites: Secondary | ICD-10-CM | POA: Diagnosis not present

## 2022-06-28 DIAGNOSIS — M8580 Other specified disorders of bone density and structure, unspecified site: Secondary | ICD-10-CM | POA: Diagnosis not present

## 2022-06-28 DIAGNOSIS — N6099 Unspecified benign mammary dysplasia of unspecified breast: Secondary | ICD-10-CM

## 2022-06-28 DIAGNOSIS — C8200 Follicular lymphoma grade I, unspecified site: Secondary | ICD-10-CM | POA: Diagnosis not present

## 2022-06-28 NOTE — Assessment & Plan Note (Addendum)
She has non drenching night sweats a few time per week.  We previously discussed about chemotherapy with BR x 6 cycles given that she has developed some constitutional symptoms.  patient would like to hold off chemotherapy treatment. We discussed about if her constitutional symptoms getting worse, or lymph node progressively enlarge to bulky size, or lymphoma is causing organ damages, we can trigger treatment at that time. She has no significantly worse constitutional symptoms.  PET scan images were reviewed and discussed with patient. [facility] radiologist, no hypermetabolic activities in the liver, pancreas, adrenal glands or spleen] Repeat PET scan in 1 year  Currently on watchful waiting.

## 2022-06-28 NOTE — Progress Notes (Signed)
Pt and husband in for follow up, reports having some discomfort and swollen lymph nodes on left side of neck.

## 2022-06-28 NOTE — Progress Notes (Signed)
Hematology/Oncology Progress note [phone] [phone]    REASON FOR VISIT Follow up for management of follicular lymphoma and ALH.   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  We previously discussed about chemotherapy with BR x 6 cycles given that she has developed some constitutional symptoms.  patient would like to hold off chemotherapy treatment. We discussed about if her constitutional symptoms getting worse, or lymph node progressively enlarge to bulky size, or lymphoma is causing organ damages, we can trigger treatment at that time. She has no significantly worse constitutional symptoms.  PET scan images were reviewed and discussed with patient. [name] radiologist, no hypermetabolic activities in the liver, pancreas, adrenal glands or spleen] Repeat PET scan in 1 year  Currently on watchful waiting.   Atypical lobular hyperplasia [name]) of breast Discussed with patient that [facility] is a high risk lesion, associated with increased risk of breast cancer. Options of chemo prevention were reviewed with the patient. Tamoxifen versus aromatase inhibitor  Patient declined chemo prevention and prefers surveillance only.  Obtain annual mammogram bilaterally.   Osteopenia continue calcium and vitamin D supplementation.  Orders Placed This Encounter  Procedures   MM DIAG BREAST TOMO BILATERAL    Standing Status:   Future    Standing Expiration Date:   06/29/2023    Order Specific Question:   Reason for Exam (SYMPTOM  OR DIAGNOSIS REQUIRED)    Answer:   -ATYPICAL LOBULAR HYPERPLASIA    Order Specific Question:   Is the patient  pregnant?    Answer:   No    Order Specific Question:   Preferred imaging location?    Answer:   [name]   US BREAST LTD UNI LEFT INC AXILLA    Standing Status:   Future    Standing Expiration Date:   06/29/2023    Order Specific Question:   Reason for Exam (SYMPTOM  OR DIAGNOSIS REQUIRED)    Answer:   -ATYPICAL LOBULAR HYPERPLASIA    Order Specific Question:   Preferred imaging location?    Answer:   [name]   US BREAST LTD UNI RIGHT INC AXILLA    Standing Status:   Future    Standing Expiration Date:   06/29/2023    Order Specific Question:   Reason for Exam (SYMPTOM  OR DIAGNOSIS REQUIRED)    Answer:   -ATYPICAL LOBULAR HYPERPLASIA    Order Specific Question:   Preferred imaging location?    Answer:   [name]   NM PET Image Restage (PS) Skull Base to Thigh (F-18 FDG)    Standing Status:   Future    Standing Expiration Date:   06/29/2023    Order Specific Question:   If indicated for the ordered procedure, I authorize the administration of a radiopharmaceutical per Radiology protocol    Answer:   Yes    Order Specific Question:   Preferred imaging location?    Answer:   [name]    Order Specific Question:   Radiology Contrast Protocol - do NOT remove file path    Answer:   [email or web address]    Order Specific Question:   Is the patient pregnant?  Answer:   No   CBC with Differential/Platelet    Standing Status:   Future    Standing Expiration Date:   06/29/2023   Comprehensive metabolic panel    Standing Status:   Future    Standing Expiration Date:   06/29/2023   Lactate dehydrogenase    Standing Status:   Future    Standing Expiration Date:   06/29/2023   Follow up in 1 year All questions were answered. The patient knows to call the clinic with any problems, questions or concerns.  Earlie Server, MD, PhD [facility] Hematology Oncology 06/28/2022    HISTORY OF PRESENTING ILLNESS: Carly Roberts 57 y.o. female who is referred by Dr.Oaks to [place] for evaluation and management of recently diagnosed follicular lymphoma. patientWas in her usual state of health until recently when she started to notice enlarging left neck mass she went to her primary care physician and ultrasound was done showing a few small lymph nodes. She continues to feel uncomfortableness in her neck and therefore a CT scan was performed. CT neck and chest with contrast on 12/08/2016 showed mild adenopathy in the left supraclavicular region and bilateral axillary/subpectoral small bilateral lower lobe nodules nonspecific. As followed by a PET scan on 12/10/2016 which showed pathologically enlarged Deauville 2 left supraclavicular and left subpectoral and axillary adenopathy in the chest. Pathologically enlarged nodes or retroperitoneal pelvic adenopathy in the abdomen. Normal size Deauville 2 scattered lymph nodes in the neck. Patient had ultrasound guided right inguinal node biopsy on 12/20/2016 and pathology showed follicular lymphoma, grade 1-2. KI 67 showed a low proliferation index 5%. Patient was referred to see me for discussion about treatment plan  # She was evaluate at [facility] by Dr.Grover who recommends repeating image scan. to get an idea of the pace of progression of her disease. She does not otherwise clearly meet GELF criteria except for mild symptoms related to neck lymphadenopathy as well as some mild anxiety related to deferring treatment, which is also a possible indication for treatment. If her disease burden continues to be relatively low with slow disease pace, it is reasonable to continue watchful waiting vs discussion of single agent rituximab as a low toxicity alternative. We also discussed common side effects associated with rituximab. If her disease burden has increased, another option for treatment would be rituximab-bendamustine which likely would offer a longer remission than single agent rituximab but is  also a more aggressive treatment   # Colonoscopy on 04/06/2018 showed lymphocytic colitis and biopsies of the ascending colon and sigmoid.  She was prescribed on Entocort which she feels not helping with her symptoms, so she self discontinued.  07/22/2020 PET scan images were independently reviewed by me and discussed with patient Overall slight improvement of adenopathy with mild hypermetabolic pelvic and inguinal adenopathy.  Slight enlargement increase activity within node superiorly in the left groin.  Deauville 3   05/28/2021, bilateral screening mammogram showed right breast calcifications warrants further evaluation 06/16/2021, unilateral right diagnostic mammogram showed indeterminate grouped calcification spanning 0.7 cm in the upper outer right breast. 07/01/2021, right breast upper outer quadrant stereotactic biopsy showed atypical lobular hyperplasia.  Negative for ductal carcinoma in situ and malignancy. 08/28/2021, patient underwent right upper quadrant wire localized excision.  Pathology showed residual: No cell changes with calcifications.  Background breast with sclerosing adenosis, cyst formation and lobular neoplasm.  Negative for atypical ductal hyperplasia, ductal carcinoma in situ and invasive carcinoma. Dr. Bary Castilla discussed with patient about chemoprevention.  Patient  would like to further discuss  about her options. Patient is postmenopausal, history of endometrial ablation, FSH 89.6. Denies any hormone replacement therapy. History of OCP use for 10 to 15 years  10/29/2021, patient establish care with rheumatology Dr. Posey Pronto for psoriatic arthritis.  She was recommended to start a course of prednisone and [place] Patient with history listed above reviewed by me presents to follow up for follicular cell lymphoma and atypical lobular hyperplasia. Patient has lost weight since last visit.  She follows up with rheumatology for arthritis.  + night sweat, not  drenching, for a few times per week.  Denies weight loss, fever.  Chronic intermittent fullness of neck lymph nodes.  + stomach virus x 1 week.     Review of Systems  Constitutional:  Positive for malaise/fatigue. Negative for chills, fever and weight loss.       Night sweats  HENT:  Negative for congestion, ear discharge, ear pain, nosebleeds, sinus pain and sore throat.        Neck lymph nodes swelling, wax and wane.   Eyes:  Negative for double vision, photophobia, pain, discharge and redness.  Respiratory:  Negative for cough, hemoptysis, sputum production, shortness of breath and wheezing.   Cardiovascular:  Negative for chest pain, palpitations, orthopnea, claudication and leg swelling.  Gastrointestinal:  Negative for abdominal pain, blood in stool, constipation, heartburn, melena, nausea and vomiting.  Genitourinary:  Negative for dysuria, flank pain, frequency and hematuria.       Groin lymph node swelling   Musculoskeletal:  Negative for back pain and myalgias.  Skin:  Negative for itching and rash.  Neurological:  Negative for dizziness, tingling, tremors, focal weakness, weakness and headaches.  Endo/Heme/Allergies:  Negative for environmental allergies. Does not bruise/bleed easily.  Psychiatric/Behavioral:  Negative for depression, hallucinations, substance abuse and suicidal ideas. The patient is not nervous/anxious.     MEDICAL HISTORY: Past Medical History:  Diagnosis Date   Anemia    Anxiety and depression    Cancer ([place]) 2018   Non Hodgkin's follicular lymphoma   Chicken pox    Fibrocystic breast 2011   GERD (gastroesophageal reflux disease)    Heart murmur    History of mammogram 05/27/2015   [email or web address]   Hypertension    Lymphocytic colitis 04/2018   Dr. Vira Agar   Menorrhagia    Osteoarthritis    Uterine fibroid     SURGICAL HISTORY: Past Surgical History:  Procedure Laterality Date   ABLATION  2008   endometrial ablation   BREAST BIOPSY Right  07/01/2021   X Clip- path pending   BREAST BIOPSY Right 08/28/2021   Procedure: BREAST BIOPSY WITH NEEDLE LOCALIZATION;  Surgeon: [name], MD;  Location: [facility] ORS;  Service: General;  Laterality: Right;   BREAST CYST ASPIRATION Bilateral 12/31/2015   FNA done by Dr. Bary Castilla   BREAST EXCISIONAL BIOPSY Right 2005   Fibroadenoma, duct adenoma, right breast or o'clock.   COLONOSCOPY  08/25/2011   removed 3 mm polyp Dr. Vira Agar   COLONOSCOPY  04/2018   lymphocytic colitis   DILATION AND CURETTAGE OF UTERUS     HYSTEROSCOPY     LYMPH NODE BIOPSY  2018   TOTAL HIP ARTHROPLASTY Right 04/03/2020   Procedure: TOTAL HIP ARTHROPLASTY ANTERIOR APPROACH;  Surgeon: Hessie Knows, MD;  Location: [facility] ORS;  Service: Orthopedics;  Laterality: Right;   UTERINE FIBROID SURGERY  09/2019   WISDOM TOOTH EXTRACTION      SOCIAL HISTORY: Social History  Socioeconomic History   Marital status: Married    Spouse name: Ed   Number of children: 2   Years of education: 14   Highest education level: Not on file  Occupational History   Occupation: Armed forces training and education officer  Tobacco Use   Smoking status: Never   Smokeless tobacco: Never  Vaping Use   Vaping Use: Never used  Substance and Sexual Activity   Alcohol use: Yes    Alcohol/week: 0.0 standard drinks of alcohol    Comment: rare   Drug use: No   Sexual activity: Yes    Partners: Male    Birth control/protection: Surgical    Comment: vasectomy  Other Topics Concern   Not on file  Social History Narrative   Not on file   Social Determinants of Health   Financial Resource Strain: Not on file  Food Insecurity: Not on file  Transportation Needs: Not on file  Physical Activity: Not on file  Stress: Not on file  Social Connections: Not on file  Intimate Partner Violence: Not on file    FAMILY HISTORY Family History  Problem Relation Age of Onset   Multiple sclerosis Mother    Hypothyroidism Mother    Diverticulosis Mother         intestinal obstruction   Hyperlipidemia Father    Heart disease Father        Had MI 2016   Hypertension Father    Skin cancer Father    Autoimmune disease Sister        alopecia    Lung cancer Maternal Aunt        59s   Alcohol abuse Maternal Uncle    Lung cancer Maternal Uncle        93s   Colon cancer Maternal Uncle    Stroke Maternal Grandmother    Hypertension Maternal Grandmother    Kidney disease Maternal Grandmother        had a kidney removed   Diabetes Maternal Grandmother    Prostate cancer Maternal Grandfather    Hypertension Paternal Grandfather    Stomach cancer Paternal Grandfather    Bladder Cancer Paternal Grandfather    Breast cancer Neg Hx     ALLERGIES:  is allergic to acrylic polymer [carbomer], merthiolate [thimerosal (thiomersal)], sulfa antibiotics, and sulfasalazine.  MEDICATIONS:  Current Outpatient Medications  Medication Sig Dispense Refill   acetaminophen (TYLENOL) 500 MG tablet Take by mouth.     Apremilast (OTEZLA) 10 & 20 & 30 MG TBPK Take by mouth.     Biotin 1 MG CAPS Take by mouth.     gabapentin (NEURONTIN) 300 MG capsule Take 300 mg by mouth at bedtime.     Melatonin 5 MG CAPS Take by mouth.     meloxicam (MOBIC) 15 MG tablet Take 15 mg by mouth daily as needed (arthritis).     pantoprazole (PROTONIX) 20 MG tablet Take 20 mg by mouth in the morning.     Cholecalciferol (VITAMIN D3) 25 MCG (1000 UT) CAPS Take 1,000 Units by mouth every evening. (Patient not taking: Reported on 11/24/2021)     No current facility-administered medications for this visit.    PHYSICAL EXAMINATION:  ECOG PERFORMANCE STATUS: 0 - Asymptomatic  Vitals:   06/28/22 1034  BP: (!) 123/55  Pulse: 71  Resp: 16  Temp: 98.3 F (36.8 C)  SpO2: 99%    Filed Weights   06/28/22 1034  Weight: 127 lb 11.2 oz (57.9 kg)     Physical Exam Constitutional:  General: She is not in acute distress.    Appearance: She is not diaphoretic.  HENT:      Head: Normocephalic and atraumatic.     Nose: Nose normal.     Mouth/Throat:     Pharynx: No oropharyngeal exudate.  Eyes:     General: No scleral icterus.       Left eye: No discharge.     Conjunctiva/sclera: Conjunctivae normal.     Pupils: Pupils are equal, round, and reactive to light.  Neck:     Vascular: No JVD.     Comments: cervical lymph nodes bilateral, left >right  Cardiovascular:     Rate and Rhythm: Normal rate and regular rhythm.     Heart sounds: Normal heart sounds. No murmur heard. Pulmonary:     Effort: Pulmonary effort is normal. No respiratory distress.     Breath sounds: Normal breath sounds. No wheezing or rales.  Chest:     Chest wall: No tenderness.  Abdominal:     General: Bowel sounds are normal. There is no distension.     Palpations: Abdomen is soft. There is no mass.     Tenderness: There is no abdominal tenderness. There is no rebound.  Musculoskeletal:        General: No tenderness. Normal range of motion.     Cervical back: Normal range of motion and neck supple.  Lymphadenopathy:     Cervical: No cervical adenopathy.  Skin:    General: Skin is warm and dry.     Findings: No erythema or rash.  Neurological:     Mental Status: She is alert and oriented to person, place, and time.     Cranial Nerves: No cranial nerve deficit.     Motor: No abnormal muscle tone.     Coordination: Coordination normal.  Psychiatric:        Mood and Affect: Affect normal.        Judgment: Judgment normal.     LABORATORY DATA: I have personally reviewed the data as listed:    Latest Ref Rng & Units 06/23/2022   11:55 AM 06/26/2021   11:41 AM 07/21/2020    3:21 PM  CBC  WBC 4.0 - 10.5 K/uL 5.4  5.7  6.2   Hemoglobin 12.0 - 15.0 g/dL 12.0  12.5  12.7   Hematocrit 36.0 - 46.0 % 37.1  38.0  38.3   Platelets 150 - 400 K/uL 253  265  303       Latest Ref Rng & Units 06/23/2022   11:55 AM 06/26/2021   11:41 AM 07/21/2020    3:21 PM  CMP  Glucose 70 - 99 mg/dL 91   103  86   BUN 6 - 20 mg/dL 14  17  18   $ Creatinine 0.44 - 1.00 mg/dL 0.86  0.98  0.88   Sodium 135 - 145 mmol/L 136  137  137   Potassium 3.5 - 5.1 mmol/L 3.6  4.3  3.7   Chloride 98 - 111 mmol/L 100  101  102   CO2 22 - 32 mmol/L 26  27  25   $ Calcium 8.9 - 10.3 mg/dL 9.2  9.6  9.3   Total Protein 6.5 - 8.1 g/dL 6.9  7.3  7.5   Total Bilirubin 0.3 - 1.2 mg/dL 0.4  0.3  0.6   Alkaline Phos 38 - 126 U/L 66  65  55   AST 15 - 41 U/L 20  21  19  ALT 0 - 44 U/L 16  20  15      $ Surgical Pathology 12/20/2016 CASE: [id number]  PATIENT: Carly Roberts  Surgical Pathology Report DIAGNOSIS:  A.  LYMPH NODE, RIGHT GROIN; NEEDLE CORE BIOPSY:  - FOLLICULAR LYMPHOMA, GRADE 1-2, SEE COMMENT.   Comment: A sample was sent for flow cytometry ([employer], Accession [id number]) with the following interpretation: CD10+ clonal B-cell population detected.  The case was sent to [facility] for consultation.  The findings of the external consultant Dr. Angelena Form are incorporated above.  This is the comment of the external consultant: Thank you for the opportunity to review this case.  HE sections demonstrate small core needle biopsy tissue fragments with a dense lymphoid proliferation with a vaguely follicular architecture.  The lymphocytes are composed of small mature-appearing forms.  No significant population of large lymphocytes is detected. Immunohistochemical stains are performed and evaluated to further characterize this lymphoid proliferation.  CD3 marks T cells and negatively highlights B-cell rich follicles.  CD20 marks numerous B-cell rich follicles, as well as numerous interfollicular B cells.  The B cells are positive for CD10, BCL 6 and BCL-2.  CD5 marks T cells without overt coexpression in B cells, while a cyclin D1 immunostain is negative in lymphocytes.  123456 highlights follicular dendritic cell mesh works underlying most of the follicles.  Ki-67  showed a low proliferation index (5%).  Per report, flow cytometric analysis reveals a CD10 positive clonal B-cell population.  For details, see [facility] report.  The  morphologic and immunophenotypic findings support a diagnosis of follicular lymphoma, grade 1-2, with a follicular pattern in this small submitted sample.  Selected slides from this case were reviewed and discussed at our hematopathology consensus conference on 12/23/16.      RADIOGRAPHIC STUDIES: I have personally reviewed the radiological images as listed and agree with the findings in the report NM PET Image Restage (PS) Skull Base to Thigh (F-18 FDG)  Addendum Date: 06/28/2022   ADDENDUM REPORT: 06/28/2022 12:33 ADDENDUM: Dictation error in the abdominal/pelvis section of the findings portion regarding metabolic activity in the liver, spleen, adrenal glands and pancreas. This should state: NO hypermetabolic activity within the liver, pancreas, adrenal glands or spleen. Electronically Signed   By: Dahlia Bailiff M.D.   On: 06/28/2022 12:33   Result Date: 06/28/2022 CLINICAL DATA:  Subsequent treatment strategy for follicular lymphoma. EXAM: NUCLEAR MEDICINE PET SKULL BASE TO THIGH TECHNIQUE: 6.96 mCi F-18 FDG was injected intravenously. Full-ring PET imaging was performed from the skull base to thigh after the radiotracer. CT data was obtained and used for attenuation correction and anatomic localization. Fasting blood glucose: 93 mg/dl COMPARISON:  Multiple priors including most recent PET-CT June 23, 2021 FINDINGS: Mediastinal blood pool activity: SUV max 1.5 Liver activity: SUV max 2.6 NECK: Previously indexed right supraclavicular lymph node measures 6 mm in short axis on image 61/2 with a max SUV of 1.1 previously measuring 8 mm with a max SUV of 4.6. No new enlarged hypermetabolic cervical lymph nodes Incidental CT findings: None. CHEST: Increased size of hypermetabolic bilateral axillary, right subpectoral and left  retroclavicular lymph nodes. For reference: -Right axillary lymph node measures 8 mm in short axis on image 79/2 with a max SUV of 2.1 previously measuring 5 mm. -left axillary lymph node measures 8 mm in short axis on image 73/2 with a max SUV of 3.6 previously measuring 4 mm in short axis. Incidental CT findings: Borderline cardiomegaly. Calcified right lower lobe  granuloma. ABDOMEN/PELVIS: Prominent lymph nodes in the jejunal mesentery measure up to 7 mm in short axis on image 149/2 with a max SUV of 2.0 Some inguinal and pelvic lymph nodes have decreased in size and/or hypermetabolic activity while others have increased in size and/or hypermetabolic activity. Previously indexed pelvic and inguinal lymph nodes are as follows: -right common iliac lymph node measures 8 mm in short axis on image 196/2 with a max SUV of 1.9 previously measuring 8 mm with a max SUV of 3.2. Right inguinal lymph node measures 9 mm in short axis on image 195/2 with a max SUV of 5.6 previously measuring 1 cm in short axis with a max SUV of 7.1 -low right inguinal lymph node measures 1 cm in short axis on image 220/2 with a max SUV of 4.0 previously measuring 11 mm with a max SUV of 4.2. Previously non indexed left external iliac lymph node measures 9 mm in short axis on image 196/2 with a max SUV of 3.5 in retrospect this measured 4.7 cm without significant abnormal FDG avidity. Hypermetabolic activity within the liver, pancreas, adrenal glands, or spleen. Incidental CT findings: Normal size spleen. Left cortical renal scarring. Large calcified uterine fibroid. SKELETON: No focal hypermetabolic activity to suggest skeletal metastasis. Incidental CT findings: Right total hip arthroplasty IMPRESSION: 1. Mixed response in the hypermetabolic adenopathy above and below the diaphragm with decrease in size and/or FDG avidity of some lymph nodes but increase in size and/or FDG avidity in other lymph nodes as well there are areas of new  hypermetabolic adenopathy above and below the diaphragm. 2. No splenomegaly or evidence of hypermetabolic osseous lymphomatous involvement Electronically Signed: By: Dahlia Bailiff M.D. On: 06/24/2022 13:00

## 2022-06-28 NOTE — Assessment & Plan Note (Addendum)
Discussed with patient that [facility] is a high risk lesion, associated with increased risk of breast cancer. Options of chemo prevention were reviewed with the patient. Tamoxifen versus aromatase inhibitor  Patient declined chemo prevention and prefers surveillance only.  Obtain annual mammogram bilaterally.

## 2022-06-28 NOTE — Assessment & Plan Note (Signed)
continue calcium and vitamin D supplementation.

## 2022-07-09 ENCOUNTER — Ambulatory Visit
Admission: RE | Admit: 2022-07-09 | Discharge: 2022-07-09 | Disposition: A | Discharge status: Home / Self Care | Payer: BC Managed Care – PPO | Source: Ambulatory Visit | Attending: Oncology | Admitting: Oncology

## 2022-07-09 DIAGNOSIS — N6099 Unspecified benign mammary dysplasia of unspecified breast: Secondary | ICD-10-CM

## 2022-09-23 ENCOUNTER — Telehealth: Payer: Self-pay | Admitting: Obstetrics

## 2022-09-23 NOTE — Telephone Encounter (Signed)
Left message for patient to call office back to schedule annual with [facility]

## 2022-09-28 NOTE — Telephone Encounter (Signed)
LM x 2 for patient to call office back to schedule annual appt 

## 2023-05-31 ENCOUNTER — Other Ambulatory Visit: Payer: Self-pay | Admitting: Oncology

## 2023-05-31 DIAGNOSIS — Z1231 Encounter for screening mammogram for malignant neoplasm of breast: Secondary | ICD-10-CM

## 2023-06-27 ENCOUNTER — Ambulatory Visit
Admission: RE | Admit: 2023-06-27 | Discharge: 2023-06-27 | Disposition: A | Discharge status: Home / Self Care | Payer: 59 | Source: Ambulatory Visit | Attending: Oncology | Admitting: Oncology

## 2023-06-27 DIAGNOSIS — C8298 Follicular lymphoma, unspecified, lymph nodes of multiple sites: Secondary | ICD-10-CM | POA: Diagnosis present

## 2023-06-27 LAB — GLUCOSE, CAPILLARY: Glucose-Capillary: 80 mg/dL (ref 70–99)

## 2023-06-27 MED ORDER — FLUDEOXYGLUCOSE F - 18 (FDG) INJECTION
6.6000 | Freq: Once | INTRAVENOUS | Status: AC | PRN
  Administered 2023-06-27: 6.82 via INTRAVENOUS

## 2023-07-01 ENCOUNTER — Other Ambulatory Visit: Payer: Self-pay | Admitting: *Deleted

## 2023-07-01 DIAGNOSIS — C8298 Follicular lymphoma, unspecified, lymph nodes of multiple sites: Secondary | ICD-10-CM

## 2023-07-04 ENCOUNTER — Encounter: Payer: Self-pay | Admitting: Oncology

## 2023-07-04 ENCOUNTER — Inpatient Hospital Stay: Payer: 59 | Attending: Oncology

## 2023-07-04 ENCOUNTER — Inpatient Hospital Stay: Payer: 59 | Admitting: Oncology

## 2023-07-04 VITALS — BP 150/66 | HR 59 | Temp 96.6°F | Resp 20 | Wt 128.9 lb

## 2023-07-04 DIAGNOSIS — R61 Generalized hyperhidrosis: Secondary | ICD-10-CM | POA: Insufficient documentation

## 2023-07-04 DIAGNOSIS — R918 Other nonspecific abnormal finding of lung field: Secondary | ICD-10-CM | POA: Insufficient documentation

## 2023-07-04 DIAGNOSIS — R634 Abnormal weight loss: Secondary | ICD-10-CM | POA: Insufficient documentation

## 2023-07-04 DIAGNOSIS — N6021 Fibroadenosis of right breast: Secondary | ICD-10-CM | POA: Diagnosis not present

## 2023-07-04 DIAGNOSIS — K219 Gastro-esophageal reflux disease without esophagitis: Secondary | ICD-10-CM | POA: Diagnosis not present

## 2023-07-04 DIAGNOSIS — L4050 Arthropathic psoriasis, unspecified: Secondary | ICD-10-CM | POA: Insufficient documentation

## 2023-07-04 DIAGNOSIS — Z8042 Family history of malignant neoplasm of prostate: Secondary | ICD-10-CM | POA: Insufficient documentation

## 2023-07-04 DIAGNOSIS — N6099 Unspecified benign mammary dysplasia of unspecified breast: Secondary | ICD-10-CM | POA: Diagnosis not present

## 2023-07-04 DIAGNOSIS — I10 Essential (primary) hypertension: Secondary | ICD-10-CM | POA: Diagnosis not present

## 2023-07-04 DIAGNOSIS — Z800 Family history of malignant neoplasm of digestive organs: Secondary | ICD-10-CM | POA: Diagnosis not present

## 2023-07-04 DIAGNOSIS — Z79899 Other long term (current) drug therapy: Secondary | ICD-10-CM | POA: Diagnosis not present

## 2023-07-04 DIAGNOSIS — C8298 Follicular lymphoma, unspecified, lymph nodes of multiple sites: Secondary | ICD-10-CM

## 2023-07-04 DIAGNOSIS — Z801 Family history of malignant neoplasm of trachea, bronchus and lung: Secondary | ICD-10-CM | POA: Insufficient documentation

## 2023-07-04 DIAGNOSIS — Z8052 Family history of malignant neoplasm of bladder: Secondary | ICD-10-CM | POA: Insufficient documentation

## 2023-07-04 DIAGNOSIS — K118 Other diseases of salivary glands: Secondary | ICD-10-CM

## 2023-07-04 DIAGNOSIS — R011 Cardiac murmur, unspecified: Secondary | ICD-10-CM | POA: Diagnosis not present

## 2023-07-04 DIAGNOSIS — N920 Excessive and frequent menstruation with regular cycle: Secondary | ICD-10-CM | POA: Diagnosis not present

## 2023-07-04 LAB — CMP (CANCER CENTER ONLY)
ALT: 18 U/L (ref 0–44)
AST: 20 U/L (ref 15–41)
Albumin: 4.3 g/dL (ref 3.5–5.0)
Alkaline Phosphatase: 66 U/L (ref 38–126)
Anion gap: 10 (ref 5–15)
BUN: 12 mg/dL (ref 6–20)
CO2: 26 mmol/L (ref 22–32)
Calcium: 9.2 mg/dL (ref 8.9–10.3)
Chloride: 101 mmol/L (ref 98–111)
Creatinine: 0.91 mg/dL (ref 0.44–1.00)
GFR, Estimated: >60 mL/min (ref >60)
Glucose, Bld: 90 mg/dL (ref 70–99)
Potassium: 4.3 mmol/L (ref 3.5–5.1)
Sodium: 137 mmol/L (ref 135–145)
Total Bilirubin: 0.8 mg/dL (ref 0.0–1.2)
Total Protein: 7.1 g/dL (ref 6.5–8.1)

## 2023-07-04 LAB — CBC WITH DIFFERENTIAL/PLATELET
Abs Immature Granulocytes: 0.04 10*3/uL (ref 0.00–0.07)
Basophils Absolute: 0 10*3/uL (ref 0.0–0.1)
Basophils Relative: 1 %
Eosinophils Absolute: 0.1 10*3/uL (ref 0.0–0.5)
Eosinophils Relative: 1 %
HCT: 39.4 % (ref 36.0–46.0)
Hemoglobin: 12.9 g/dL (ref 12.0–15.0)
Immature Granulocytes: 1 %
Lymphocytes Relative: 21 %
Lymphs Abs: 1.4 10*3/uL (ref 0.7–4.0)
MCH: 28.2 pg (ref 26.0–34.0)
MCHC: 32.7 g/dL (ref 30.0–36.0)
MCV: 86.2 fL (ref 80.0–100.0)
Monocytes Absolute: 0.6 10*3/uL (ref 0.1–1.0)
Monocytes Relative: 9 %
Neutro Abs: 4.5 10*3/uL (ref 1.7–7.7)
Neutrophils Relative %: 67 %
Platelets: 299 10*3/uL (ref 150–400)
RBC: 4.57 MIL/uL (ref 3.87–5.11)
RDW: 13.5 % (ref 11.5–15.5)
WBC: 6.6 10*3/uL (ref 4.0–10.5)
nRBC: 0 % (ref 0.0–0.2)

## 2023-07-04 LAB — LACTATE DEHYDROGENASE: LDH: 175 U/L (ref 98–192)

## 2023-07-04 NOTE — Assessment & Plan Note (Addendum)
She has non drenching night sweats a few time per week.  She has no significantly worse constitutional symptoms.  PET scan images were reviewed and discussed with patient. Progression of abdominal/pelvis lymphadenopathy activity. Does not meet GELF criteria for triggering treatment.  Repeat PET scan in 6 months   Currently on watchful waiting.

## 2023-07-04 NOTE — Progress Notes (Signed)
Hematology/Oncology Progress note [phone] [phone]    REASON FOR VISIT Follow up for management of follicular lymphoma and ALH.   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  She has no significantly worse constitutional symptoms.  PET scan images were reviewed and discussed with patient. Progression of abdominal/pelvis lymphadenopathy activity. Does not meet GELF criteria for triggering treatment.  Repeat PET scan in 6 months   Currently on watchful waiting.   Atypical lobular hyperplasia (ALH) of breast ALH is a high risk lesion, associated with increased risk of breast cancer. Tamoxifen versus aromatase inhibitor  Patient declined chemo prevention and prefers surveillance only.  Obtain annual mammogram bilaterally.   Parotid nodule Minimal activity, monitor and attention on follow up PET  Orders Placed This Encounter  Procedures   NM PET Image Restag (PS) Skull Base To Thigh    Standing Status:   Future    Expected Date:   01/01/2024    Expiration Date:   07/03/2024    If indicated for the ordered procedure, I authorize the administration of a radiopharmaceutical per Radiology protocol:   Yes    Is the patient pregnant?:   No    Preferred imaging location?:   [facility]   CBC with Differential (Cancer Center Only)    Standing Status:   Future    Expected Date:   01/01/2024    Expiration Date:   07/03/2024   CMP (Cancer Center only)    Standing Status:   Future    Expected Date:   01/01/2024    Expiration Date:   07/03/2024   Lactate dehydrogenase    Standing Status:   Future    Expected Date:    01/01/2024    Expiration Date:   07/03/2024   Follow up in 6 months  All questions were answered. The patient knows to call the clinic with any problems, questions or concerns.  Rickard Patience, MD, PhD [facility] Hematology Oncology 07/04/2023    HISTORY OF PRESENTING ILLNESS: [name] 58 y.o. female who is referred by Dr.Oaks to [place] for evaluation and management of recently diagnosed follicular lymphoma. patientWas in her usual state of health until recently when she started to notice enlarging left neck mass she went to her primary care physician and ultrasound was done showing a few small lymph nodes. She continues to feel uncomfortableness in her neck and therefore a CT scan was performed. CT neck and chest with contrast on 12/08/2016 showed mild adenopathy in the left supraclavicular region and bilateral axillary/subpectoral small bilateral lower lobe nodules nonspecific. As followed by a PET scan on 12/10/2016 which showed pathologically enlarged Deauville 2 left supraclavicular and left subpectoral and axillary adenopathy in the chest. Pathologically enlarged nodes or retroperitoneal pelvic adenopathy in the abdomen. Normal size Deauville 2 scattered lymph nodes in the neck. Patient had ultrasound guided right inguinal node biopsy on 12/20/2016 and pathology showed follicular lymphoma, grade 1-2. KI 67 showed a low proliferation index 5%. Patient was referred to see me for discussion about treatment plan  # She was evaluate at [facility] by Dr.Grover who recommends repeating image scan. to get an idea of the  pace of progression of her disease. She does not otherwise clearly meet GELF criteria except for mild symptoms related to neck lymphadenopathy as well as some mild anxiety related to deferring treatment, which is also a possible indication for treatment. If her disease burden continues to be relatively low with slow disease pace, it is reasonable to continue watchful waiting vs discussion of  single agent rituximab as a low toxicity alternative. We also discussed common side effects associated with rituximab. If her disease burden has increased, another option for treatment would be rituximab-bendamustine which likely would offer a longer remission than single agent rituximab but is also a more aggressive treatment   # Colonoscopy on 04/06/2018 showed lymphocytic colitis and biopsies of the ascending colon and sigmoid.  She was prescribed on Entocort which she feels not helping with her symptoms, so she self discontinued.  07/22/2020 PET scan images were independently reviewed by me and discussed with patient Overall slight improvement of adenopathy with mild hypermetabolic pelvic and inguinal adenopathy.  Slight enlargement increase activity within node superiorly in the left groin.  Deauville 3   05/28/2021, bilateral screening mammogram showed right breast calcifications warrants further evaluation 06/16/2021, unilateral right diagnostic mammogram showed indeterminate grouped calcification spanning 0.7 cm in the upper outer right breast. 07/01/2021, right breast upper outer quadrant stereotactic biopsy showed atypical lobular hyperplasia.  Negative for ductal carcinoma in situ and malignancy. 08/28/2021, patient underwent right upper quadrant wire localized excision.  Pathology showed residual: No cell changes with calcifications.  Background breast with sclerosing adenosis, cyst formation and lobular neoplasm.  Negative for atypical ductal hyperplasia, ductal carcinoma in situ and invasive carcinoma. Dr. Lemar Livings discussed with patient about chemoprevention.  Patient would like to further discuss  about her options. Patient is postmenopausal, history of endometrial ablation, FSH 89.6. Denies any hormone replacement therapy. History of OCP use for 10 to 15 years  10/29/2021, patient establish care with rheumatology Dr. Allena Katz for psoriatic arthritis.  She was recommended to start a course of  prednisone and Otezla  INTERVAL HISTORY Patient with history listed above reviewed by me presents to follow up for follicular cell lymphoma and atypical lobular hyperplasia. Patient has lost weight since last visit.  She follows up with rheumatology for arthritis.  + night sweat has improved. Very rare.  Denies weight loss, fever.  Chronic intermittent fullness of neck lymph nodes.   06/27/2023 PET scan showed  1. Similar low level metabolism within small cervical and thoracic lymph nodes. New/progressive hypermetabolic retroperitoneal and inguinal adenopathy. 2. Minimally hypermetabolic 4 mm left parotid nodule. Malignancy cannot be excluded.  06/30/2023 Xray right hip done at [facility] showed  AP pelvis and lateral x-rays of the right hip were ordered and personally  reviewed today. These show some radiolucency around the stem with some  stress reaction laterally. Proximally in the porous coated area, there  appears to be bony ingrowth. No micromotion apparent. Cup appears stable  without any loosening.   Comparing to prior x-ray from 05/14/2020, there is a large calcification  within the pelvis. There had been a slight shadow previously, which is  difficult to tell, but there is a large, calcified mass in the central  pelvis now.   She has appointment with GYN   Review of Systems  Constitutional:  Positive for malaise/fatigue. Negative for chills, fever and weight loss.       Night sweats  HENT:  Negative for congestion, ear discharge, ear pain, nosebleeds, sinus pain and sore throat.  Neck lymph nodes swelling, wax and wane.   Eyes:  Negative for double vision, photophobia, pain, discharge and redness.  Respiratory:  Negative for cough, hemoptysis, sputum production, shortness of breath and wheezing.   Cardiovascular:  Negative for chest pain, palpitations, orthopnea, claudication and leg swelling.  Gastrointestinal:  Negative for abdominal pain, blood in stool,  constipation, heartburn, melena, nausea and vomiting.  Genitourinary:  Negative for dysuria, flank pain, frequency and hematuria.       Groin lymph node swelling   Musculoskeletal:  Negative for back pain and myalgias.  Skin:  Negative for itching and rash.  Neurological:  Negative for dizziness, tingling, tremors, focal weakness, weakness and headaches.  Endo/Heme/Allergies:  Negative for environmental allergies. Does not bruise/bleed easily.  Psychiatric/Behavioral:  Negative for depression, hallucinations, substance abuse and suicidal ideas. The patient is not nervous/anxious.     MEDICAL HISTORY: Past Medical History:  Diagnosis Date   Anemia    Anxiety and depression    Cancer (HCC) 2018   Non Hodgkin's follicular lymphoma   Chicken pox    Fibrocystic breast 2011   GERD (gastroesophageal reflux disease)    Heart murmur    History of mammogram 05/27/2015   [id number]   Hypertension    Lymphocytic colitis 04/2018   Dr. Mechele Collin   Menorrhagia    Osteoarthritis    Uterine fibroid     SURGICAL HISTORY: Past Surgical History:  Procedure Laterality Date   ABLATION  2008   endometrial ablation   BREAST BIOPSY Right 07/01/2021   X Clip-ALH   BREAST BIOPSY Right 08/28/2021   Procedure: BREAST BIOPSY WITH NEEDLE LOCALIZATION;  Surgeon: Earline Mayotte, MD;  Location: [facility] ORS;  Service: General;  Laterality: Right;   BREAST CYST ASPIRATION Bilateral 12/31/2015   FNA done by Dr. Lemar Livings   BREAST EXCISIONAL BIOPSY Right 2005   Fibroadenoma, duct adenoma, right breast or o'clock.   COLONOSCOPY  08/25/2011   removed 3 mm polyp Dr. Mechele Collin   COLONOSCOPY  04/2018   lymphocytic colitis   DILATION AND CURETTAGE OF UTERUS     HYSTEROSCOPY     LYMPH NODE BIOPSY  2018   TOTAL HIP ARTHROPLASTY Right 04/03/2020   Procedure: TOTAL HIP ARTHROPLASTY ANTERIOR APPROACH;  Surgeon: Kennedy Bucker, MD;  Location: [facility] ORS;  Service: Orthopedics;  Laterality: Right;   UTERINE FIBROID SURGERY   09/2019   WISDOM TOOTH EXTRACTION      SOCIAL HISTORY: Social History   Socioeconomic History   Marital status: Married    Spouse name: Ed   Number of children: 2   Years of education: 14   Highest education level: Not on file  Occupational History   Occupation: Comptroller  Tobacco Use   Smoking status: Never   Smokeless tobacco: Never  Vaping Use   Vaping status: Never Used  Substance and Sexual Activity   Alcohol use: Yes    Alcohol/week: 0.0 standard drinks of alcohol    Comment: rare   Drug use: No   Sexual activity: Yes    Partners: Male    Birth control/protection: Surgical    Comment: vasectomy  Other Topics Concern   Not on file  Social History Narrative   Not on file   Social Drivers of Health   Financial Resource Strain: Low Risk  (06/30/2023)   Received from [facility]   Overall Financial Resource Strain (CARDIA)    Difficulty of Paying Living Expenses: Not hard at all  Food Insecurity: No  Food Insecurity (06/30/2023)   Received from [facility]   Hunger Vital Sign    Worried About Running Out of Food in the Last Year: Never true    Ran Out of Food in the Last Year: Never true  Transportation Needs: No Transportation Needs (06/30/2023)   Received from [facility] - Transportation    In the past 12 months, has lack of transportation kept you from medical appointments or from getting medications?: No    Lack of Transportation (Non-Medical): No  Physical Activity: Not on file  Stress: Not on file  Social Connections: Not on file  Intimate Partner Violence: Not on file    FAMILY HISTORY Family History  Problem Relation Age of Onset   Multiple sclerosis Mother    Hypothyroidism Mother    Diverticulosis Mother        intestinal obstruction   Hyperlipidemia Father    Heart disease Father        Had MI 2016   Hypertension Father    Skin cancer Father    Autoimmune disease  Sister        alopecia    Lung cancer Maternal Aunt        62s   Alcohol abuse Maternal Uncle    Lung cancer Maternal Uncle        28s   Colon cancer Maternal Uncle    Stroke Maternal Grandmother    Hypertension Maternal Grandmother    Kidney disease Maternal Grandmother        had a kidney removed   Diabetes Maternal Grandmother    Prostate cancer Maternal Grandfather    Hypertension Paternal Grandfather    Stomach cancer Paternal Grandfather    Bladder Cancer Paternal Grandfather    Breast cancer Neg Hx     ALLERGIES:  is allergic to acrylic polymer [carbomer], merthiolate [thimerosal (thiomersal)], sulfa antibiotics, and sulfasalazine.  MEDICATIONS:  Current Outpatient Medications  Medication Sig Dispense Refill   acetaminophen (TYLENOL) 500 MG tablet Take by mouth.     Apremilast (OTEZLA) 10 & 20 & 30 MG TBPK Take by mouth.     Biotin 1 MG CAPS Take by mouth.     gabapentin (NEURONTIN) 300 MG capsule Take 300 mg by mouth at bedtime.     Melatonin 5 MG CAPS Take by mouth.     meloxicam (MOBIC) 15 MG tablet Take 15 mg by mouth daily as needed (arthritis).     pantoprazole (PROTONIX) 20 MG tablet Take 20 mg by mouth in the morning.     Cholecalciferol (VITAMIN D3) 25 MCG (1000 UT) CAPS Take 1,000 Units by mouth every evening. (Patient not taking: Reported on 11/24/2021)     No current facility-administered medications for this visit.    PHYSICAL EXAMINATION:  ECOG PERFORMANCE STATUS: 0 - Asymptomatic  Vitals:   07/04/23 0954  BP: (!) 150/66  Pulse: (!) 59  Resp: 20  Temp: (!) 96.6 F (35.9 C)  SpO2: 100%    Filed Weights   07/04/23 0954  Weight: 128 lb 14.4 oz (58.5 kg)     Physical Exam Constitutional:      General: She is not in acute distress.    Appearance: She is not diaphoretic.  HENT:     Head: Normocephalic and atraumatic.     Nose: Nose normal.     Mouth/Throat:     Pharynx: No oropharyngeal exudate.  Eyes:     General: No scleral icterus.  Left eye: No discharge.     Conjunctiva/sclera: Conjunctivae normal.     Pupils: Pupils are equal, round, and reactive to light.  Neck:     Vascular: No JVD.     Comments: cervical lymph nodes bilateral, left >right  Cardiovascular:     Rate and Rhythm: Normal rate and regular rhythm.     Heart sounds: Normal heart sounds. No murmur heard. Pulmonary:     Effort: Pulmonary effort is normal. No respiratory distress.     Breath sounds: Normal breath sounds. No wheezing or rales.  Chest:     Chest wall: No tenderness.  Abdominal:     General: Bowel sounds are normal. There is no distension.     Palpations: Abdomen is soft. There is no mass.     Tenderness: There is no abdominal tenderness. There is no rebound.  Musculoskeletal:        General: No tenderness. Normal range of motion.     Cervical back: Normal range of motion and neck supple.  Lymphadenopathy:     Cervical: No cervical adenopathy.  Skin:    General: Skin is warm and dry.     Findings: No erythema or rash.  Neurological:     Mental Status: She is alert and oriented to person, place, and time.     Cranial Nerves: No cranial nerve deficit.     Motor: No abnormal muscle tone.     Coordination: Coordination normal.  Psychiatric:        Mood and Affect: Affect normal.        Judgment: Judgment normal.     LABORATORY DATA: I have personally reviewed the data as listed:    Latest Ref Rng & Units 07/04/2023    9:43 AM 06/23/2022   11:55 AM 06/26/2021   11:41 AM  CBC  WBC 4.0 - 10.5 K/uL 6.6  5.4  5.7   Hemoglobin 12.0 - 15.0 g/dL 21.3  08.6  57.8   Hematocrit 36.0 - 46.0 % 39.4  37.1  38.0   Platelets 150 - 400 K/uL 299  253  265       Latest Ref Rng & Units 07/04/2023    9:44 AM 06/23/2022   11:55 AM 06/26/2021   11:41 AM  CMP  Glucose 70 - 99 mg/dL 90  91  469   BUN 6 - 20 mg/dL 12  14  17    Creatinine 0.44 - 1.00 mg/dL 6.29  5.28  4.13   Sodium 135 - 145 mmol/L 137  136  137   Potassium 3.5 - 5.1 mmol/L 4.3   3.6  4.3   Chloride 98 - 111 mmol/L 101  100  101   CO2 22 - 32 mmol/L 26  26  27    Calcium 8.9 - 10.3 mg/dL 9.2  9.2  9.6   Total Protein 6.5 - 8.1 g/dL 7.1  6.9  7.3   Total Bilirubin 0.0 - 1.2 mg/dL 0.8  0.4  0.3   Alkaline Phos 38 - 126 U/L 66  66  65   AST 15 - 41 U/L 20  20  21    ALT 0 - 44 U/L 18  16  20       Surgical Pathology 12/20/2016 CASE: [id number]  PATIENT: Carly Roberts  Surgical Pathology Report DIAGNOSIS:  A.  LYMPH NODE, RIGHT GROIN; NEEDLE CORE BIOPSY:  - FOLLICULAR LYMPHOMA, GRADE 1-2, SEE COMMENT.   Comment: A sample was sent for flow cytometry ([employer]/[facility], Accession [id number])  with the following interpretation: CD10+ clonal B-cell population detected.  The case was sent to [facility] for consultation.  The findings of the external consultant Dr. Clifton James are incorporated above.  This is the comment of the external consultant: Thank you for the opportunity to review this case.  HE sections demonstrate small core needle biopsy tissue fragments with a dense lymphoid proliferation with a vaguely follicular architecture.  The lymphocytes are composed of small mature-appearing forms.  No significant population of large lymphocytes is detected. Immunohistochemical stains are performed and evaluated to further characterize this lymphoid proliferation.  CD3 marks T cells and negatively highlights B-cell rich follicles.  CD20 marks numerous B-cell rich follicles, as well as numerous interfollicular B cells.  The B cells are positive for CD10, BCL 6 and BCL-2.  CD5 marks T cells without overt coexpression in B cells, while a cyclin D1 immunostain is negative in lymphocytes.  CD23 highlights follicular dendritic cell mesh works underlying most of the follicles.  Ki-67 showed a low proliferation index (5%).  Per report, flow cytometric analysis reveals a CD10 positive clonal B-cell population.  For details, see [employer]  report.  The  morphologic and immunophenotypic findings support a diagnosis of follicular lymphoma, grade 1-2, with a follicular pattern in this small submitted sample.  Selected slides from this case were reviewed and discussed at our hematopathology consensus conference on 12/23/16.      RADIOGRAPHIC STUDIES: I have personally reviewed the radiological images as listed and agree with the findings in the report NM PET Image Restage (PS) Skull Base to Thigh (F-18 FDG) Result Date: 07/04/2023 CLINICAL DATA:  Subsequent treatment strategy for lymphoma. EXAM: NUCLEAR MEDICINE PET SKULL BASE TO THIGH TECHNIQUE: 6.8 mCi F-18 FDG was injected intravenously. Full-ring PET imaging was performed from the skull base to thigh after the radiotracer. CT data was obtained and used for attenuation correction and anatomic localization. Fasting blood glucose: 80 mg/dl COMPARISON:  16/02/9603. FINDINGS: Mediastinal blood pool activity: SUV max 2.4 Liver activity: SUV max 3.0 NECK: Minimally hypermetabolic 4 mm left parotid nodule, SUV max 2.7. Small cervical lymph nodes do not show metabolism above blood pool. Incidental CT findings: None. CHEST: Right anterior scalene lymph node measures 7 mm (6/37), SUV max 2.6. Low right axillary lymph node measures 7 mm (6/62), SUV max 3.7. Additional small supraclavicular and axillary lymph nodes do not show metabolism blood pool. Incidental CT findings: Heart is at the upper limits of normal in size to mildly enlarged. No pericardial or pleural effusion. ABDOMEN/PELVIS: New or progressive hypermetabolic abdominal and pelvic retroperitoneal adenopathy. Index left periaortic lymph node measures 13 mm (6/100), SUV max 8.2, new. Pre-existing left external iliac lymph node measures 1.2 cm (6/125), SUV max 7.7 previously 3.8. New or progressive inguinal adenopathy. Index high right inguinal lymph node measures 10 mm (6/127), SUV max 8.9, previously 5.6. No additional abnormal hypermetabolism.  Incidental CT findings: Scarring in the upper pole left kidney. Large peripherally calcified uterine fibroid. SKELETON: No abnormal hypermetabolism. Incidental CT findings: Right hip arthroplasty.  Degenerative changes in the spine. IMPRESSION: 1. Similar low level metabolism within small cervical and thoracic lymph nodes. New/progressive hypermetabolic retroperitoneal and inguinal adenopathy. 2. Minimally hypermetabolic 4 mm left parotid nodule. Malignancy cannot be excluded. Electronically Signed   By: Leanna Battles M.D.   On: 07/04/2023 08:30

## 2023-07-04 NOTE — Assessment & Plan Note (Signed)
Minimal activity, monitor and attention on follow up PET

## 2023-07-04 NOTE — Assessment & Plan Note (Addendum)
ALH is a high risk lesion, associated with increased risk of breast cancer. Tamoxifen versus aromatase inhibitor  Patient declined chemo prevention and prefers surveillance only.  Obtain annual mammogram bilaterally.

## 2023-07-06 ENCOUNTER — Encounter: Payer: Self-pay | Admitting: Obstetrics

## 2023-07-07 NOTE — Telephone Encounter (Signed)
Contacted the patient via phone, she is scheduled for 3/6 with Dr Logan Bores.

## 2023-07-07 NOTE — Telephone Encounter (Signed)
Please reach out and get pt scheduled with a dr that can do her surgery. thanks

## 2023-07-13 ENCOUNTER — Ambulatory Visit
Admission: RE | Admit: 2023-07-13 | Discharge: 2023-07-13 | Disposition: A | Discharge status: Home / Self Care | Payer: 59 | Source: Ambulatory Visit | Attending: Oncology | Admitting: Oncology

## 2023-07-13 ENCOUNTER — Encounter: Payer: Self-pay | Admitting: Obstetrics

## 2023-07-13 ENCOUNTER — Ambulatory Visit (INDEPENDENT_AMBULATORY_CARE_PROVIDER_SITE_OTHER): Payer: 59 | Admitting: Obstetrics

## 2023-07-13 VITALS — BP 126/67 | HR 64 | Wt 128.0 lb

## 2023-07-13 DIAGNOSIS — Z01419 Encounter for gynecological examination (general) (routine) without abnormal findings: Secondary | ICD-10-CM

## 2023-07-13 DIAGNOSIS — D251 Intramural leiomyoma of uterus: Secondary | ICD-10-CM

## 2023-07-13 DIAGNOSIS — R102 Pelvic and perineal pain: Secondary | ICD-10-CM

## 2023-07-13 DIAGNOSIS — Z1231 Encounter for screening mammogram for malignant neoplasm of breast: Secondary | ICD-10-CM | POA: Diagnosis present

## 2023-07-13 NOTE — Progress Notes (Signed)
 GYNECOLOGY: ANNUAL EXAM   Subjective:    PCP: Danella Penton, MD Carly Roberts is a 58 y.o. female [phone] who presents for annual wellness visit. She has PMH of Non-Hodgkin's follicular lymphoma and atypical lobular hyperplasia of her right breast, and due to slow-progressing nature of her lymphoma, is followed by PET scans Q13mos and under surveillance only for the [facility]; her oncologist is Dr. Rickard Patience, who manages her mammograms, is having one later today.   She also has a hx of fibroids with bulk symptoms. Pelvic pain was complicated by moderate R hip osteoarthirits, fibroids evaluated by Dr. Bonney Aid in 01/2019 with the largest fibroid approx 6cm on the right side, pt elected for [place]. After the [place], her pain continued and was attributed to the arthritis. She underwent right hip replacement 03/2020 for the arthirits, but still continues to have right sided pelvic pain, mainly inguinal and sometimes feels radiation to her RUQ when she bends over. Is now ready to surgically address the fibroid. Due to diagnoses of lymphoma and ALH, would prefer to avoid medical therapies.    Well Woman Visit:  GYN HISTORY:  No LMP recorded. Patient has had an ablation.     Menstrual History: OB History     Gravida  2   Para  2   Term  2   Preterm      AB      Living  2      SAB      IAB      Ectopic      Multiple      Live Births  2        Obstetric Comments  1st Menstrual Cycle: 14 1st Pregnancy:  89          Menarche age: 14 No LMP recorded. Patient has had an ablation.   Urinary incontinence? no  Sexually active: yes Number of sexual partners: 1 Gender of sexual Partners: male  Social History   Substance and Sexual Activity  Sexual Activity Yes   Partners: Male   Birth control/protection: Surgical   Comment: vasectomy   Contraceptive methods: no method Dyspareunia? no STI history: no STI/HIV testing or immunizations needed? No.   Health  Maintenance: -Last pap: was normal 02/05/20 --> Any abnormals: no -Last mammogram: today  --> Any abnormals? Yes -Last colon cancer screen: 2018 / Type: colonoscopy -Last DEXA scan: no -[facility] of Breast / Colon / Cervical cancer: no -Vaccines:  Immunization History  Administered Date(s) Administered   Moderna Sars-Covid-2 Vaccination 07/13/2019, 08/10/2019   Tdap 06/17/2015   Last Tdap: utd / Flu: declined / COVID: utd / Gardasil: age out / Shingles (50+): declined / PCV20: declined -Hep C screen: pcp -Last lipid / glucose screening: pcp  > Exercise: none, moderately active > Dietary Supplements: Folate: Yes;  Calcium: Yes}; Vitamin D: Yes > Body mass index is 23.41 kg/m.  > Recent dental visit Yes.   > Seat Belt Use: Yes.   > Texting and driving? No. > Guns in the house Yes.   > Recreational or other drug use: denied.   Social History   Tobacco Use   Smoking status: Never   Smokeless tobacco: Never  Substance Use Topics   Alcohol use: Yes    Alcohol/week: 0.0 standard drinks of alcohol    Comment: rare   Occupation: Runner, broadcasting/film/video     Lives with: husband     PHQ-2 Score: In last two weeks, how often have you  felt: Little interest or pleasure in doing things: Not at all (0) Feeling down, depressed or hopeless: Not at all (0) Score: 0  GAD-2 Over the last 2 weeks, how often have you been bothered by the following problems? Feeling nervous, anxious or on edge: Not at all (0) Not being able to stop or control worrying: Not at all (0)} Score: 0 _________________________________________________________  Current Outpatient Medications  Medication Sig Dispense Refill   acetaminophen (TYLENOL) 500 MG tablet Take by mouth.     Apremilast (OTEZLA) 10 & 20 & 30 MG TBPK Take by mouth.     Biotin 1 MG CAPS Take by mouth.     Cholecalciferol (VITAMIN D3) 25 MCG (1000 UT) CAPS Take 1,000 Units by mouth every evening.     gabapentin (NEURONTIN) 300 MG capsule Take 300 mg by mouth at  bedtime.     Melatonin 5 MG CAPS Take by mouth.     meloxicam (MOBIC) 15 MG tablet Take 15 mg by mouth daily as needed (arthritis).     pantoprazole (PROTONIX) 20 MG tablet Take 20 mg by mouth in the morning.     No current facility-administered medications for this visit.   Allergies  Allergen Reactions   Acrylic Polymer [Carbomer] Rash   Merthiolate [Thimerosal (Thiomersal)] Rash   Sulfa Antibiotics Rash   Sulfasalazine Rash    Past Medical History:  Diagnosis Date   Anemia    Anxiety and depression    Cancer (HCC) 2018   Non Hodgkin's follicular lymphoma   Chicken pox    Fibrocystic breast 2011   GERD (gastroesophageal reflux disease)    Heart murmur    History of mammogram 05/27/2015   [id number]   Hypertension    Lymphocytic colitis 04/2018   Dr. Mechele Collin   Menorrhagia    Osteoarthritis    Uterine fibroid    Past Surgical History:  Procedure Laterality Date   ABLATION  2008   endometrial ablation   BREAST BIOPSY Right 07/01/2021   X Clip-ALH   BREAST BIOPSY Right 08/28/2021   Procedure: BREAST BIOPSY WITH NEEDLE LOCALIZATION;  Surgeon: Earline Mayotte, MD;  Location: [facility] ORS;  Service: General;  Laterality: Right;   BREAST CYST ASPIRATION Bilateral 12/31/2015   FNA done by Dr. Lemar Livings   BREAST EXCISIONAL BIOPSY Right 2005   Fibroadenoma, duct adenoma, right breast or o'clock.   COLONOSCOPY  08/25/2011   removed 3 mm polyp Dr. Mechele Collin   COLONOSCOPY  04/2018   lymphocytic colitis   DILATION AND CURETTAGE OF UTERUS     HYSTEROSCOPY     LYMPH NODE BIOPSY  2018   TOTAL HIP ARTHROPLASTY Right 04/03/2020   Procedure: TOTAL HIP ARTHROPLASTY ANTERIOR APPROACH;  Surgeon: Kennedy Bucker, MD;  Location: [facility] ORS;  Service: Orthopedics;  Laterality: Right;   UTERINE FIBROID SURGERY  09/2019   WISDOM TOOTH EXTRACTION      Review Of Systems  Constitutional: Denied constitutional symptoms, night sweats, recent illness, fatigue, fever, insomnia and weight loss.  Eyes:  Denied eye symptoms, eye pain, photophobia, vision change and visual disturbance.  Ears/Nose/Throat/Neck: Denied ear, nose, throat or neck symptoms, hearing loss, nasal discharge, sinus congestion and sore throat.  Cardiovascular: Denied cardiovascular symptoms, arrhythmia, chest pain/pressure, edema, exercise intolerance, orthopnea and palpitations.  Respiratory: Denied pulmonary symptoms, asthma, pleuritic pain, productive sputum, cough, dyspnea and wheezing.  Gastrointestinal: Denied, gastro-esophageal reflux, melena, nausea and vomiting.  Genitourinary: Pelvic pain  Musculoskeletal: Denied musculoskeletal symptoms, stiffness, swelling, muscle weakness and myalgia.  Dermatologic:  Denied dermatology symptoms, rash and scar.  Neurologic: Denied neurology symptoms, dizziness, headache, neck pain and syncope.  Psychiatric: Denied psychiatric symptoms, anxiety and depression.  Endocrine: Denied endocrine symptoms including hot flashes and night sweats.      Objective:    BP 126/67   Pulse 64   Wt 128 lb (58.1 kg)   BMI 23.41 kg/m   Constitutional: Well-developed, well-nourished female in no acute distress Neurological: Alert and oriented to person, place, and time Psychiatric: Mood and affect appropriate Skin: No rashes or lesions Neck: Supple without masses. Trachea is midline.Thyroid is normal size without masses Lymphatics: No cervical, axillary, supraclavicular, or inguinal adenopathy noted Respiratory: Clear to auscultation bilaterally. Good air movement with normal work of breathing. Cardiovascular: Regular rate and rhythm. Extremities grossly normal, nontender with no edema; pulses regular Gastrointestinal: Soft, nontender, nondistended. No masses or hernias appreciated. No hepatosplenomegaly. No fluid wave. No rebound or guarding. Breast Exam: normal appearance, no masses or tenderness, Inspection negative, No nipple retraction or dimpling, No nipple discharge or bleeding, No  axillary or supraclavicular adenopathy, Normal to palpation without dominant masses Genitourinary:         External Genitalia: Normal female genitalia    Vagina: Normal mucosa, no lesions.    Cervix: No lesions, normal size and consistency; no cervical motion tenderness; non-friable; Pap not obtained.    Uterus: Enlarged size and contour; tender on the right Adnexae: Non-palpable and non-tender Perineum/Anus: No lesions Rectal: deferred    Assessment/Plan:    Carly Roberts is a 58 y.o. female G2P2002 with normal well-woman gynecologic exam.  -Screenings:  Pap: due 01/2025 Mammogram: is having done today, managed by oncology Colon: Oncology Labs: PCP GAD/PHQ-2 = 0 -Contraception: post-menopausal -Vaccines: Recommend Shingrix and PCV20  -Healthy lifestyle modifications discussed: multivitamin, diet, exercise, sunscreen, tobacco and alcohol use. Emphasized importance of regular physical activity.  -Calcium and Vit D recommendation reviewed.   Uterine fibroids: -S/p [place] and still with bulk symptoms, mainly on the R -Discussed medical therapies and pt prefers to avoid medications due to lymphoma and [facility] -Will obtain new [place] in preparation for upcoming surgical consult with Dr. Logan Bores  Follow up as scheduled with Dr. Logan Bores for fibroids.  Return in about 1 year (around 07/12/2024) for Annual.    Julieanne Manson, DO [facility] at [facility]

## 2023-07-18 ENCOUNTER — Ambulatory Visit: Payer: 59

## 2023-07-18 ENCOUNTER — Ambulatory Visit
Admission: RE | Admit: 2023-07-18 | Discharge: 2023-07-18 | Disposition: A | Discharge status: Home / Self Care | Payer: 59 | Source: Ambulatory Visit | Attending: Obstetrics | Admitting: Obstetrics

## 2023-07-18 DIAGNOSIS — D252 Subserosal leiomyoma of uterus: Secondary | ICD-10-CM | POA: Diagnosis present

## 2023-07-18 DIAGNOSIS — R102 Pelvic and perineal pain unspecified side: Secondary | ICD-10-CM

## 2023-07-18 DIAGNOSIS — D251 Intramural leiomyoma of uterus: Secondary | ICD-10-CM | POA: Diagnosis present

## 2023-07-21 ENCOUNTER — Ambulatory Visit: Payer: 59 | Admitting: Obstetrics and Gynecology

## 2023-07-21 ENCOUNTER — Encounter: Payer: Self-pay | Admitting: Obstetrics and Gynecology

## 2023-07-21 VITALS — BP 175/92 | HR 76 | Ht 62.0 in | Wt 131.5 lb

## 2023-07-21 DIAGNOSIS — D252 Subserosal leiomyoma of uterus: Secondary | ICD-10-CM

## 2023-07-21 DIAGNOSIS — D251 Intramural leiomyoma of uterus: Secondary | ICD-10-CM

## 2023-07-21 NOTE — Progress Notes (Signed)
 HPI:      [name] is a 58 y.o. [phone] who LMP was No LMP recorded. Patient has had an ablation.  Subjective:   She presents today to discuss her uterine fibroid.  She has recently begun having rare but occasional back pain mostly hip pain and a burning sensation in her lower right thigh/abdomen area.  Her main problem is the burning sensation and the hip pain.  She has recently seen her orthopedic surgeon who performed hip surgery and he reports that he does not believe there is anything wrong with her hip.  She has a history of lymphoma and there is some question about lymph nodes present on a recent PET scan.  She is seeing me today because she has a history of uterine fibroid which was recently seen on CT to be calcified.  A follow-up ultrasound also shows this fibroid.  Her history is significant for endometrial ablation and previous UFE. She is menopausal and not having any bleeding or significant cramping. She says she has a history of "precancerous cells" from a past breast biopsy. She is not on any type of HRT.  She continues to have hot flashes but these have subsided in frequency and intensity.    Hx: The following portions of the patient's history were reviewed and updated as appropriate:             She  has a past medical history of Anemia, Anxiety and depression, Cancer (HCC) (2018), Chicken pox, Fibrocystic breast (2011), GERD (gastroesophageal reflux disease), Heart murmur, History of mammogram (05/27/2015), Hypertension, Lymphocytic colitis (04/2018), Menorrhagia, Osteoarthritis, and Uterine fibroid. She does not have any pertinent problems on file. She  has a past surgical history that includes Ablation (2008); Colonoscopy (08/25/2011); Dilation and curettage of uterus; Wisdom tooth extraction; Hysteroscopy; Lymph node biopsy (2018); Breast excisional biopsy (Right, 2005); Breast cyst aspiration (Bilateral, 12/31/2015); Colonoscopy (04/2018); Uterine fibroid surgery  (09/2019); Total hip arthroplasty (Right, 04/03/2020); Breast biopsy (Right, 07/01/2021); and Breast biopsy (Right, 08/28/2021). Her family history includes Alcohol abuse in her maternal uncle; Autoimmune disease in her sister; Bladder Cancer in her paternal grandfather; Colon cancer in her maternal uncle; Diabetes in her maternal grandmother; Diverticulosis in her mother; Heart disease in her father; Hyperlipidemia in her father; Hypertension in her father, maternal grandmother, and paternal grandfather; Hypothyroidism in her mother; Kidney disease in her maternal grandmother; Lung cancer in her maternal aunt and maternal uncle; Multiple sclerosis in her mother; Prostate cancer in her maternal grandfather; Skin cancer in her father; Stomach cancer in her paternal grandfather; Stroke in her maternal grandmother. She  reports that she has never smoked. She has never used smokeless tobacco. She reports that she does not currently use alcohol. She reports that she does not use drugs. She has a current medication list which includes the following prescription(s): acetaminophen, otezla, vitamin d3, pantoprazole, rhubarb-ashwagandha-magnesium, gabapentin, melatonin, and meloxicam. She is allergic to acrylic polymer [carbomer], merthiolate [thimerosal (thiomersal)], sulfa antibiotics, and sulfasalazine.       Review of Systems:  Review of Systems  Constitutional: Denied constitutional symptoms, night sweats, recent illness, fatigue, fever, insomnia and weight loss.  Eyes: Denied eye symptoms, eye pain, photophobia, vision change and visual disturbance.  Ears/Nose/Throat/Neck: Denied ear, nose, throat or neck symptoms, hearing loss, nasal discharge, sinus congestion and sore throat.  Cardiovascular: Denied cardiovascular symptoms, arrhythmia, chest pain/pressure, edema, exercise intolerance, orthopnea and palpitations.  Respiratory: Denied pulmonary symptoms, asthma, pleuritic pain, productive sputum, cough,  dyspnea and wheezing.  Gastrointestinal: Denied, gastro-esophageal reflux, melena, nausea and vomiting.  Genitourinary: Denied genitourinary symptoms including symptomatic vaginal discharge, pelvic relaxation issues, and urinary complaints.  Musculoskeletal: See HPI for additional information.  Dermatologic: Denied dermatology symptoms, rash and scar.  Neurologic: Denied neurology symptoms, dizziness, headache, neck pain and syncope.  Psychiatric: Denied psychiatric symptoms, anxiety and depression.  Endocrine: Denied endocrine symptoms including hot flashes and night sweats.   Meds:   Current Outpatient Medications on File Prior to Visit  Medication Sig Dispense Refill   acetaminophen (TYLENOL) 500 MG tablet Take by mouth.     Apremilast (OTEZLA) 10 & 20 & 30 MG TBPK Take by mouth.     Cholecalciferol (VITAMIN D3) 25 MCG (1000 UT) CAPS Take 1,000 Units by mouth every evening.     pantoprazole (PROTONIX) 20 MG tablet Take 20 mg by mouth in the morning.     RHUBARB-ASHWAGANDHA-MAGNESIUM PO Take by mouth.     gabapentin (NEURONTIN) 300 MG capsule Take 300 mg by mouth at bedtime. (Patient not taking: Reported on 07/21/2023)     Melatonin 5 MG CAPS Take by mouth. (Patient not taking: Reported on 07/21/2023)     meloxicam (MOBIC) 15 MG tablet Take 15 mg by mouth daily as needed (arthritis). (Patient not taking: Reported on 07/21/2023)     No current facility-administered medications on file prior to visit.      Objective:     Vitals:   07/21/23 1103  BP: (!) 175/92  Pulse: 76   Filed Weights   07/21/23 1103  Weight: 131 lb 8 oz (59.6 kg)              When she describes her hip pain she moves her hip in a way that makes me think it continues to be an issue with her previous hip replacement.  The pelvic lower quadrant pain that she has is in the lymph node chain of her groin area and not related to an inguinal hernia or uterus.  Her ultrasound shows that her fibroid has become slightly  smaller over the last few years.  It appears exactly as a benign calcified fibroid would.  It seems to be intramural.          Assessment:    G2P2002 Patient Active Problem List   Diagnosis Date Noted   Parotid nodule 07/04/2023   Osteopenia 06/28/2022   Genetic testing 01/12/2022   Atypical lobular hyperplasia (ALH) of breast 11/24/2021   Psoriatic arthritis (HCC) 11/24/2021   S/P hip replacement 04/03/2020   Lymphocytic colitis 11/02/2018   Uterine fibroid 01/17/2018   Goals of care, counseling/discussion 10/13/2017   Follicular lymphoma of lymph nodes of multiple regions (HCC) 12/27/2016   Elevated LDL cholesterol level 11/30/2016   Family history of coronary artery disease 11/30/2016   Murmur 11/30/2016   Bradycardia 11/23/2016   Soft tissue lesion 11/23/2016   Hordeolum externum of right lower eyelid 11/23/2016   Bilateral breast cysts 11/08/2016   Hair loss 03/03/2016   Anxiety and depression 03/03/2016   Hoarseness 03/03/2016     1. Intramural and subserous leiomyoma of uterus     Strongly doubt that her main symptoms have anything to do with pelvic organs including uterine fibroid.  I do not believe a hysterectomy would solve her problems.  If anything possibly a small degree of her back pain could be attributed to the fibroid.  We have briefly discussed HRT and its benefits but would have to check with her oncologist and with her previous  surgeon regarding what she describes as "precancerous cells in her breast".   Plan:            1.  Refer back to orthopedics for hip pain and to discuss lymph nodes with her oncologist.  2.  Patient to consider follow-up appointment for longer discussion regarding HRT.  This is the time where she could receive the most benefits with the least risk.  Orders No orders of the defined types were placed in this encounter.   No orders of the defined types were placed in this encounter.     F/U  Return in about 2 weeks (around  08/04/2023). I spent 40 minutes involved in the care of this patient preparing to see the patient by obtaining and reviewing her medical history (including labs, imaging tests and prior procedures), documenting clinical information in the electronic health record (EHR), counseling and coordinating care plans, writing and sending prescriptions, ordering tests or procedures and in direct communicating with the patient and medical staff discussing pertinent items from her history and physical exam.  Elonda Husky, M.D. 07/21/2023 5:22 PM

## 2023-07-21 NOTE — Progress Notes (Signed)
 Patient presents today to discuss options regarding a calcified fibroid. History of [place]. She states having right sided hip pain and after an x-ray with her orthopedic provider he recommended this follow-up.

## 2023-08-21 ENCOUNTER — Encounter: Payer: Self-pay | Admitting: Oncology

## 2023-12-26 ENCOUNTER — Ambulatory Visit
Admission: RE | Admit: 2023-12-26 | Discharge: 2023-12-26 | Disposition: A | Discharge status: Home / Self Care | Payer: 59 | Source: Ambulatory Visit | Attending: Oncology | Admitting: Oncology

## 2023-12-26 DIAGNOSIS — R591 Generalized enlarged lymph nodes: Secondary | ICD-10-CM | POA: Diagnosis not present

## 2023-12-26 DIAGNOSIS — C8298 Follicular lymphoma, unspecified, lymph nodes of multiple sites: Secondary | ICD-10-CM | POA: Insufficient documentation

## 2023-12-26 DIAGNOSIS — R932 Abnormal findings on diagnostic imaging of liver and biliary tract: Secondary | ICD-10-CM | POA: Insufficient documentation

## 2023-12-26 LAB — GLUCOSE, CAPILLARY: Glucose-Capillary: 92 mg/dL (ref 70–99)

## 2023-12-26 MED ORDER — FLUDEOXYGLUCOSE F - 18 (FDG) INJECTION
7.3700 | Freq: Once | INTRAVENOUS | Status: AC | PRN
  Administered 2023-12-26 (×2): 7.37 via INTRAVENOUS

## 2024-01-02 ENCOUNTER — Telehealth: Payer: Self-pay | Admitting: Oncology

## 2024-01-02 NOTE — Telephone Encounter (Signed)
 We usually call the day prior to get it read.

## 2024-01-02 NOTE — Telephone Encounter (Signed)
 Pt was scheduled for tomorrow 01/03/24 for pet ersults. Pt stated that the results have not been read and this happened last time. She understands that they run behind when reading scans and she doesn't want them to rush her reading, she just wants it read before her MD appt. Pt has r/s her lab/MD appt to this Thursday in the hopes that the scan is read by then.

## 2024-01-03 ENCOUNTER — Inpatient Hospital Stay: Payer: 59 | Admitting: Oncology

## 2024-01-03 ENCOUNTER — Inpatient Hospital Stay: Payer: 59

## 2024-01-05 ENCOUNTER — Inpatient Hospital Stay: Attending: Oncology

## 2024-01-05 ENCOUNTER — Encounter: Payer: Self-pay | Admitting: Oncology

## 2024-01-05 ENCOUNTER — Inpatient Hospital Stay: Admitting: Oncology

## 2024-01-05 VITALS — BP 143/68 | HR 45 | Temp 97.4°F | Resp 18 | Wt 126.8 lb

## 2024-01-05 DIAGNOSIS — R197 Diarrhea, unspecified: Secondary | ICD-10-CM | POA: Insufficient documentation

## 2024-01-05 DIAGNOSIS — I10 Essential (primary) hypertension: Secondary | ICD-10-CM | POA: Insufficient documentation

## 2024-01-05 DIAGNOSIS — C8298 Follicular lymphoma, unspecified, lymph nodes of multiple sites: Secondary | ICD-10-CM

## 2024-01-05 DIAGNOSIS — M8580 Other specified disorders of bone density and structure, unspecified site: Secondary | ICD-10-CM

## 2024-01-05 DIAGNOSIS — K118 Other diseases of salivary glands: Secondary | ICD-10-CM

## 2024-01-05 DIAGNOSIS — Z79899 Other long term (current) drug therapy: Secondary | ICD-10-CM | POA: Diagnosis not present

## 2024-01-05 DIAGNOSIS — K811 Chronic cholecystitis: Secondary | ICD-10-CM | POA: Insufficient documentation

## 2024-01-05 DIAGNOSIS — Z801 Family history of malignant neoplasm of trachea, bronchus and lung: Secondary | ICD-10-CM | POA: Insufficient documentation

## 2024-01-05 DIAGNOSIS — C8228 Follicular lymphoma grade III, unspecified, lymph nodes of multiple sites: Secondary | ICD-10-CM | POA: Insufficient documentation

## 2024-01-05 DIAGNOSIS — N6099 Unspecified benign mammary dysplasia of unspecified breast: Secondary | ICD-10-CM | POA: Diagnosis not present

## 2024-01-05 DIAGNOSIS — Z800 Family history of malignant neoplasm of digestive organs: Secondary | ICD-10-CM | POA: Diagnosis not present

## 2024-01-05 DIAGNOSIS — K521 Toxic gastroenteritis and colitis: Secondary | ICD-10-CM | POA: Insufficient documentation

## 2024-01-05 DIAGNOSIS — R1011 Right upper quadrant pain: Secondary | ICD-10-CM

## 2024-01-05 DIAGNOSIS — N6021 Fibroadenosis of right breast: Secondary | ICD-10-CM | POA: Insufficient documentation

## 2024-01-05 DIAGNOSIS — G8929 Other chronic pain: Secondary | ICD-10-CM | POA: Insufficient documentation

## 2024-01-05 LAB — CBC WITH DIFFERENTIAL (CANCER CENTER ONLY)
Abs Immature Granulocytes: 0.03 K/uL (ref 0.00–0.07)
Basophils Absolute: 0.1 K/uL (ref 0.0–0.1)
Basophils Relative: 1 %
Eosinophils Absolute: 0.2 K/uL (ref 0.0–0.5)
Eosinophils Relative: 3 %
HCT: 39.4 % (ref 36.0–46.0)
Hemoglobin: 13 g/dL (ref 12.0–15.0)
Immature Granulocytes: 1 %
Lymphocytes Relative: 21 %
Lymphs Abs: 1.3 K/uL (ref 0.7–4.0)
MCH: 28.4 pg (ref 26.0–34.0)
MCHC: 33 g/dL (ref 30.0–36.0)
MCV: 86.2 fL (ref 80.0–100.0)
Monocytes Absolute: 0.5 K/uL (ref 0.1–1.0)
Monocytes Relative: 9 %
Neutro Abs: 3.9 K/uL (ref 1.7–7.7)
Neutrophils Relative %: 65 %
Platelet Count: 267 K/uL (ref 150–400)
RBC: 4.57 MIL/uL (ref 3.87–5.11)
RDW: 12.9 % (ref 11.5–15.5)
WBC Count: 6 K/uL (ref 4.0–10.5)
nRBC: 0 % (ref 0.0–0.2)

## 2024-01-05 LAB — CMP (CANCER CENTER ONLY)
ALT: 19 U/L (ref 0–44)
AST: 25 U/L (ref 15–41)
Albumin: 4.4 g/dL (ref 3.5–5.0)
Alkaline Phosphatase: 74 U/L (ref 38–126)
Anion gap: 10 (ref 5–15)
BUN: 19 mg/dL (ref 6–20)
CO2: 24 mmol/L (ref 22–32)
Calcium: 10 mg/dL (ref 8.9–10.3)
Chloride: 103 mmol/L (ref 98–111)
Creatinine: 1.01 mg/dL — ABNORMAL HIGH (ref 0.44–1.00)
GFR, Estimated: >60 mL/min (ref >60)
Glucose, Bld: 94 mg/dL (ref 70–99)
Potassium: 4.5 mmol/L (ref 3.5–5.1)
Sodium: 137 mmol/L (ref 135–145)
Total Bilirubin: 1.1 mg/dL (ref 0.0–1.2)
Total Protein: 7.6 g/dL (ref 6.5–8.1)

## 2024-01-05 LAB — LACTATE DEHYDROGENASE: LDH: 203 U/L — ABNORMAL HIGH (ref 98–192)

## 2024-01-05 NOTE — Assessment & Plan Note (Signed)
continue calcium and vitamin D supplementation 

## 2024-01-05 NOTE — Assessment & Plan Note (Signed)
 ALH is a high risk lesion, associated with increased risk of breast cancer. Tamoxifen versus aromatase inhibitor  Patient declined chemo prevention and prefers surveillance only.  Obtain annual mammogram bilaterally.

## 2024-01-05 NOTE — Assessment & Plan Note (Signed)
 Started after she takes Otezla.  Recommend pt to discuss with rheumatology to see if she can take Imodium PRN.  Encourage oral hydration. Cr is slightly increased comparing to her baseline.

## 2024-01-05 NOTE — Assessment & Plan Note (Signed)
 Minimal activity, monitor and attention on follow up PET

## 2024-01-05 NOTE — Progress Notes (Signed)
 Hematology/Oncology Progress note [phone] [phone]    REASON FOR VISIT Follow up for management of follicular lymphoma and ALH.   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  She has no significantly worse constitutional symptoms.  PET scan images were reviewed and discussed with patient. Slight Progression of lymphadenopathy activity. Currently she does not meet GELF criteria for triggering treatment. Anticipate initiation of treatment in the near future, if she lost more than 10% of her weight, or has 3 nodal sites >3cm or other GELF criteria Repeat PET scan in 6 months   Currently on watchful waiting.   Atypical lobular hyperplasia (ALH) of breast ALH is a high risk lesion, associated with increased risk of breast cancer. Tamoxifen versus aromatase inhibitor  Patient declined chemo prevention and prefers surveillance only.  Obtain annual mammogram bilaterally.   Osteopenia continue calcium and vitamin D supplementation.  Parotid nodule Minimal activity, monitor and attention on follow up PET  Chronic RUQ pain PET scan showed increase metabolic activity.  ? Chronic cholecystitis.  Refer to surgery for evaluation.   Drug-induced diarrhea Started after she takes Otezla.  Recommend pt to discuss with rheumatology to see if she can take Imodium PRN.  Encourage oral hydration. Cr is slightly increased comparing to her baseline.   Orders Placed This Encounter  Procedures   NM PET Image Restag (PS) Skull Base To Thigh    Standing Status:   Future    Expected Date:   07/07/2024    Expiration Date:   01/04/2025     If indicated for the ordered procedure, I authorize the administration of a radiopharmaceutical per Radiology protocol:   Yes    Is the patient pregnant?:   No    Preferred imaging location?:   [facility]   CMP (Cancer Center only)    Standing Status:   Future    Expected Date:   07/07/2024    Expiration Date:   10/05/2024   CBC with Differential (Cancer Center Only)    Standing Status:   Future    Expected Date:   07/07/2024    Expiration Date:   10/05/2024   Lactate dehydrogenase    Standing Status:   Future    Expected Date:   07/07/2024    Expiration Date:   10/05/2024   Ambulatory referral to General Surgery    Referral Priority:   Routine    Referral Type:   Surgical    Referral Reason:   Specialty Services Required    Requested Specialty:   General Surgery    Number of Visits Requested:   1   Follow up in 6 months  All questions were answered. The patient knows to call the clinic with any problems, questions or concerns.  Call Babara, MD, PhD [facility] Hematology Oncology 01/05/2024    HISTORY OF PRESENTING ILLNESS: Carly Roberts 58 y.o. female who is referred by Dr.Oaks to us  for evaluation and management of recently diagnosed follicular lymphoma. patientWas in her usual state of health until recently when she started to notice enlarging left neck mass she went to her primary care physician and ultrasound was done showing a  few small lymph nodes. She continues to feel uncomfortableness in her neck and therefore a CT scan was performed. CT neck and chest with contrast on 12/08/2016 showed mild adenopathy in the left supraclavicular region and bilateral axillary/subpectoral small bilateral lower lobe nodules nonspecific. As followed by a PET scan on 12/10/2016 which showed pathologically enlarged Deauville 2 left supraclavicular and left subpectoral and axillary adenopathy in the chest. Pathologically enlarged nodes or retroperitoneal pelvic adenopathy in the abdomen. Normal  size Deauville 2 scattered lymph nodes in the neck. Patient had ultrasound guided right inguinal node biopsy on 12/20/2016 and pathology showed follicular lymphoma, grade 1-2. KI 67 showed a low proliferation index 5%. Patient was referred to see me for discussion about treatment plan  # She was evaluate at [facility] by Dr.Grover who recommends repeating image scan. to get an idea of the pace of progression of her disease. She does not otherwise clearly meet GELF criteria except for mild symptoms related to neck lymphadenopathy as well as some mild anxiety related to deferring treatment, which is also a possible indication for treatment. If her disease burden continues to be relatively low with slow disease pace, it is reasonable to continue watchful waiting vs discussion of single agent rituximab as a low toxicity alternative. We also discussed common side effects associated with rituximab. If her disease burden has increased, another option for treatment would be rituximab-bendamustine which likely would offer a longer remission than single agent rituximab but is also a more aggressive treatment   # Colonoscopy on 04/06/2018 showed lymphocytic colitis and biopsies of the ascending colon and sigmoid.  She was prescribed on Entocort which she feels not helping with her symptoms, so she self discontinued.  07/22/2020 PET scan images were independently reviewed by me and discussed with patient Overall slight improvement of adenopathy with mild hypermetabolic pelvic and inguinal adenopathy.  Slight enlargement increase activity within node superiorly in the left groin.  Deauville 3   05/28/2021, bilateral screening mammogram showed right breast calcifications warrants further evaluation 06/16/2021, unilateral right diagnostic mammogram showed indeterminate grouped calcification spanning 0.7 cm in the upper outer right breast. 07/01/2021, right breast upper outer quadrant stereotactic biopsy showed atypical lobular  hyperplasia.  Negative for ductal carcinoma in situ and malignancy. 08/28/2021, patient underwent right upper quadrant wire localized excision.  Pathology showed residual: No cell changes with calcifications.  Background breast with sclerosing adenosis, cyst formation and lobular neoplasm.  Negative for atypical ductal hyperplasia, ductal carcinoma in situ and invasive carcinoma. Dr. Dessa discussed with patient about chemoprevention.  Patient would like to further discuss  about her options. Patient is postmenopausal, history of endometrial ablation, FSH 89.6. Denies any hormone replacement therapy. History of OCP use for 10 to 15 years  10/29/2021, patient establish care with rheumatology Dr. Tobie for psoriatic arthritis.  She was recommended to start a course of prednisone and Otezla   06/27/2023 PET scan showed  1. Similar low level metabolism within small cervical and thoracic lymph nodes. New/progressive hypermetabolic retroperitoneal and inguinal adenopathy. 2. Minimally hypermetabolic 4 mm left parotid nodule. Malignancy cannot be excluded.  06/30/2023 Xray right hip done at [facility] showed  AP pelvis and lateral x-rays of the right hip were ordered and personally  reviewed today. These show some radiolucency around the stem with some  stress reaction laterally. Proximally in the porous coated area, there  appears to be bony ingrowth. No micromotion apparent. Cup appears stable  without any loosening.   Comparing to prior x-ray from 05/14/2020,  there is a large calcification  within the pelvis. There had been a slight shadow previously, which is  difficult to tell, but there is a large, calcified mass in the central  pelvis now.    INTERVAL HISTORY Patient with history listed above reviewed by me presents to follow up for follicular cell lymphoma and atypical lobular hyperplasia. Patient has lost 2 pounds since last visit.  + she takes  Otezla  which caused her to have diarrhea,  2-3 times per day. She tries to hydrate herself.  Denies weight loss, fever.  Chronic intermittent fullness of neck lymph nodes.  Intermittent chronic right upper quadrant pain.   12/26/2023 PET scan showed  1. Overall mildly progressive hypermetabolic adenopathy in the neck, axilla, abdomen and pelvis. Deauville 5. 2. No findings for osseous lymphoma. 3. Hypermetabolism in the gallbladder could reflect chronic cholecystitis.   Review of Systems  Constitutional:  Positive for malaise/fatigue. Negative for chills, fever and weight loss.  HENT:  Negative for congestion, ear discharge, ear pain, nosebleeds, sinus pain and sore throat.        Neck lymph nodes swelling, wax and wane.   Eyes:  Negative for double vision, photophobia, pain, discharge and redness.  Respiratory:  Negative for cough, hemoptysis, sputum production, shortness of breath and wheezing.   Cardiovascular:  Negative for chest pain, palpitations, orthopnea, claudication and leg swelling.  Gastrointestinal:  Negative for abdominal pain, blood in stool, constipation, heartburn, melena, nausea and vomiting.       Right upper quadrant pain intermittently  Genitourinary:  Negative for dysuria, flank pain, frequency and hematuria.       Groin lymph node swelling   Musculoskeletal:  Negative for back pain and myalgias.  Skin:  Negative for itching and rash.  Neurological:  Negative for dizziness, tingling, tremors, focal weakness, weakness and headaches.  Endo/Heme/Allergies:  Negative for environmental allergies. Does not bruise/bleed easily.  Psychiatric/Behavioral:  Negative for depression, hallucinations, substance abuse and suicidal ideas. The patient is not nervous/anxious.     MEDICAL HISTORY: Past Medical History:  Diagnosis Date   Anemia    Anxiety and depression    Cancer (HCC) 2018   Non Hodgkin's follicular lymphoma   Chicken pox    Fibrocystic breast 2011   GERD (gastroesophageal reflux disease)    Heart  murmur    History of mammogram 05/27/2015   [email or web address]   Hypertension    Lymphocytic colitis 04/2018   Dr. Viktoria   Menorrhagia    Osteoarthritis    Uterine fibroid     SURGICAL HISTORY: Past Surgical History:  Procedure Laterality Date   ABLATION  2008   endometrial ablation   BREAST BIOPSY Right 07/01/2021   X Clip-ALH   BREAST BIOPSY Right 08/28/2021   Procedure: BREAST BIOPSY WITH NEEDLE LOCALIZATION;  Surgeon: Dessa Reyes ORN, MD;  Location: [facility] ORS;  Service: General;  Laterality: Right;   BREAST CYST ASPIRATION Bilateral 12/31/2015   FNA done by Dr. Dessa   BREAST EXCISIONAL BIOPSY Right 2005   Fibroadenoma, duct adenoma, right breast or o'clock.   COLONOSCOPY  08/25/2011   removed 3 mm polyp Dr. Viktoria   COLONOSCOPY  04/2018   lymphocytic colitis   DILATION AND CURETTAGE OF UTERUS     HYSTEROSCOPY     LYMPH NODE BIOPSY  2018   TOTAL HIP ARTHROPLASTY Right 04/03/2020   Procedure: TOTAL HIP ARTHROPLASTY ANTERIOR APPROACH;  Surgeon: Kathlynn Sharper, MD;  Location: [facility] ORS;  Service: Orthopedics;  Laterality: Right;  UTERINE FIBROID SURGERY  09/2019   WISDOM TOOTH EXTRACTION      SOCIAL HISTORY: Social History   Socioeconomic History   Marital status: Married    Spouse name: Ed   Number of children: 2   Years of education: 14   Highest education level: Not on file  Occupational History   Occupation: Comptroller  Tobacco Use   Smoking status: Never   Smokeless tobacco: Never  Vaping Use   Vaping status: Never Used  Substance and Sexual Activity   Alcohol use: Not Currently    Comment: occassionaly   Drug use: No   Sexual activity: Yes    Partners: Male    Birth control/protection: Surgical    Comment: vasectomy  Other Topics Concern   Not on file  Social History Narrative   Not on file   Social Drivers of Health   Financial Resource Strain: Low Risk  (11/07/2023)   Received from [facility]   Overall Financial  Resource Strain (CARDIA)    Difficulty of Paying Living Expenses: Not hard at all  Food Insecurity: No Food Insecurity (11/07/2023)   Received from [facility]   Hunger Vital Sign    Within the past 12 months, you worried that your food would run out before you got the money to buy more.: Never true    Within the past 12 months, the food you bought just didn't last and you didn't have money to get more.: Never true  Transportation Needs: No Transportation Needs (11/07/2023)   Received from [facility] - Transportation    In the past 12 months, has lack of transportation kept you from medical appointments or from getting medications?: No    Lack of Transportation (Non-Medical): No  Physical Activity: Not on file  Stress: Not on file  Social Connections: Not on file  Intimate Partner Violence: Not on file    FAMILY HISTORY Family History  Problem Relation Age of Onset   Multiple sclerosis Mother    Hypothyroidism Mother    Diverticulosis Mother        intestinal obstruction   Hyperlipidemia Father    Heart disease Father        Had MI 2016   Hypertension Father    Skin cancer Father    Autoimmune disease Sister        alopecia    Lung cancer Maternal Aunt        76s   Alcohol abuse Maternal Uncle    Lung cancer Maternal Uncle        48s   Colon cancer Maternal Uncle    Stroke Maternal Grandmother    Hypertension Maternal Grandmother    Kidney disease Maternal Grandmother        had a kidney removed   Diabetes Maternal Grandmother    Prostate cancer Maternal Grandfather    Hypertension Paternal Grandfather    Stomach cancer Paternal Grandfather    Bladder Cancer Paternal Grandfather    Breast cancer Neg Hx     ALLERGIES:  is allergic to acrylic polymer [carbomer], merthiolate [thimerosal (thiomersal)], sulfa antibiotics, and sulfasalazine.  MEDICATIONS:  Current Outpatient Medications  Medication Sig Dispense Refill    acetaminophen  (TYLENOL ) 500 MG tablet Take by mouth.     Apremilast (OTEZLA) 10 & 20 & 30 MG TBPK Take by mouth.     Cholecalciferol  (VITAMIN D3) 25 MCG (1000 UT) CAPS Take 1,000 Units by mouth every evening.  Magnesium  300 MG TABS Take 1 tablet by mouth daily.     pantoprazole  (PROTONIX ) 20 MG tablet Take 20 mg by mouth in the morning.     RHUBARB-ASHWAGANDHA-MAGNESIUM  PO Take by mouth.     gabapentin (NEURONTIN) 300 MG capsule Take 300 mg by mouth at bedtime. (Patient not taking: Reported on 01/05/2024)     Melatonin 5 MG CAPS Take by mouth. (Patient not taking: Reported on 01/05/2024)     meloxicam (MOBIC) 15 MG tablet Take 15 mg by mouth daily as needed (arthritis). (Patient not taking: Reported on 01/05/2024)     No current facility-administered medications for this visit.    PHYSICAL EXAMINATION:  ECOG PERFORMANCE STATUS: 0 - Asymptomatic  Vitals:   01/05/24 1008 01/05/24 1018  BP: (!) 152/59 (!) 143/68  Pulse: (!) 45   Resp: 18   Temp: (!) 97.4 F (36.3 C)   SpO2: 100%     Filed Weights   01/05/24 1008  Weight: 126 lb 12.8 oz (57.5 kg)     Physical Exam Constitutional:      General: She is not in acute distress.    Appearance: She is not diaphoretic.  HENT:     Head: Normocephalic and atraumatic.     Nose: Nose normal.     Mouth/Throat:     Pharynx: No oropharyngeal exudate.  Eyes:     General: No scleral icterus.       Left eye: No discharge.     Conjunctiva/sclera: Conjunctivae normal.     Pupils: Pupils are equal, round, and reactive to light.  Neck:     Vascular: No JVD.     Comments: cervical lymph nodes bilateral, left >right  Cardiovascular:     Rate and Rhythm: Normal rate and regular rhythm.     Heart sounds: Normal heart sounds. No murmur heard. Pulmonary:     Effort: Pulmonary effort is normal. No respiratory distress.     Breath sounds: Normal breath sounds. No wheezing or rales.  Chest:     Chest wall: No tenderness.  Abdominal:      General: Bowel sounds are normal. There is no distension.     Palpations: Abdomen is soft. There is no mass.     Tenderness: There is no abdominal tenderness. There is no rebound.  Musculoskeletal:        General: No tenderness. Normal range of motion.     Cervical back: Normal range of motion and neck supple.  Lymphadenopathy:     Cervical: No cervical adenopathy.  Skin:    General: Skin is warm and dry.     Findings: No erythema or rash.  Neurological:     Mental Status: She is alert and oriented to person, place, and time.     Cranial Nerves: No cranial nerve deficit.     Motor: No abnormal muscle tone.     Coordination: Coordination normal.  Psychiatric:        Mood and Affect: Affect normal.        Judgment: Judgment normal.     LABORATORY DATA: I have personally reviewed the data as listed:    Latest Ref Rng & Units 01/05/2024    9:59 AM 07/04/2023    9:43 AM 06/23/2022   11:55 AM  CBC  WBC 4.0 - 10.5 K/uL 6.0  6.6  5.4   Hemoglobin 12.0 - 15.0 g/dL 86.9  87.0  87.9   Hematocrit 36.0 - 46.0 % 39.4  39.4  37.1  Platelets 150 - 400 K/uL 267  299  253       Latest Ref Rng & Units 01/05/2024    9:59 AM 07/04/2023    9:44 AM 06/23/2022   11:55 AM  CMP  Glucose 70 - 99 mg/dL 94  90  91   BUN 6 - 20 mg/dL 19  12  14    Creatinine 0.44 - 1.00 mg/dL 8.98  9.08  9.13   Sodium 135 - 145 mmol/L 137  137  136   Potassium 3.5 - 5.1 mmol/L 4.5  4.3  3.6   Chloride 98 - 111 mmol/L 103  101  100   CO2 22 - 32 mmol/L 24  26  26    Calcium 8.9 - 10.3 mg/dL 89.9  9.2  9.2   Total Protein 6.5 - 8.1 g/dL 7.6  7.1  6.9   Total Bilirubin 0.0 - 1.2 mg/dL 1.1  0.8  0.4   Alkaline Phos 38 - 126 U/L 74  66  66   AST 15 - 41 U/L 25  20  20    ALT 0 - 44 U/L 19  18  16       Surgical Pathology 12/20/2016 CASE: [id number]  PATIENT: Lasha LONG  Surgical Pathology Report DIAGNOSIS:  A.  LYMPH NODE, RIGHT GROIN; NEEDLE CORE BIOPSY:  - FOLLICULAR LYMPHOMA, GRADE 1-2, SEE COMMENT.    Comment: A sample was sent for flow cytometry ([employer]/[facility], Accession [id number]) with the following interpretation: CD10+ clonal B-cell population detected.  The case was sent to [facility] for consultation.  The findings of the external consultant Dr. Verlin are incorporated above.  This is the comment of the external consultant: Thank you for the opportunity to review this case.  HE sections demonstrate small core needle biopsy tissue fragments with a dense lymphoid proliferation with a vaguely follicular architecture.  The lymphocytes are composed of small mature-appearing forms.  No significant population of large lymphocytes is detected. Immunohistochemical stains are performed and evaluated to further characterize this lymphoid proliferation.  CD3 marks T cells and negatively highlights B-cell rich follicles.  CD20 marks numerous B-cell rich follicles, as well as numerous interfollicular B cells.  The B cells are positive for CD10, BCL 6 and BCL-2.  CD5 marks T cells without overt coexpression in B cells, while a cyclin D1 immunostain is negative in lymphocytes.  CD23 highlights follicular dendritic cell mesh works underlying most of the follicles.  Ki-67 showed a low proliferation index (5%).  Per report, flow cytometric analysis reveals a CD10 positive clonal B-cell population.  For details, see [employer] report.  The  morphologic and immunophenotypic findings support a diagnosis of follicular lymphoma, grade 1-2, with a follicular pattern in this small submitted sample.  Selected slides from this case were reviewed and discussed at our hematopathology consensus conference on 12/23/16.      RADIOGRAPHIC STUDIES: I have personally reviewed the radiological images as listed and agree with the findings in the report NM PET Image Restag (PS) Skull Base To Thigh Result Date: 01/03/2024 CLINICAL DATA:  Subsequent treatment strategy for folic ill  early lymphoma. EXAM: NUCLEAR MEDICINE PET SKULL BASE TO THIGH TECHNIQUE: 7.37 mCi F-18 FDG was injected intravenously. Full-ring PET imaging was performed from the skull base to thigh after the radiotracer. CT data was obtained and used for attenuation correction and anatomic localization. Fasting blood glucose: 92 mg/dl COMPARISON:  Multiple prior PET CTs.  The most recent is 06/27/2023 FINDINGS: Mediastinal blood  pool activity: SUV max 2.4 Liver activity: SUV max 2.9 NECK: Stable 8 mm hypermetabolic nodule in the left parotid gland, likely intraparotid lymph node. SUV max is 3.2 and was previously 2.7. Left level 1 B lymph nodes appear stable in size. SUV max is 3.8 and these were not hypermetabolic on the prior PET-CT. Right level 3B lymph nodes are slightly larger. Index node on image 22/6 measures 6 mm and SUV max is 4.4. This previously measured 4 mm and SUV max was 2.1. Incidental CT findings: None CHEST: Small left-sided supraclavicular nodes have an SUV max of 3.5. These were not hypermetabolic on the prior PET-CT. High left axillary lymph node on image 37/6 measures 10 mm and SUV max is 3.2. This previously measured 7 mm and SUV max was 1.4. New nodule in the subcutaneous fat overlying the left scapula has an SUV max of 4.0. Lower right axillary node measures 7 mm and is unchanged. SUV max is 3.6 and was previously 3.7. No mediastinal or hilar lymphadenopathy. No worrisome pulmonary lesions or pulmonary nodules. Incidental CT findings: None. ABDOMEN/PELVIS: Hypermetabolism is noted in the gallbladder. SUV max is 5.0. I do not see any obvious abnormality on the CT scan I suppose this could reflect chronic cholecystitis. SUV max on the prior study was 3.7. No hypermetabolic hepatic, adrenal or splenic lesions. The pancreas is unremarkable. Progressive lower left retroperitoneal adenopathy with nodal lesion at the bifurcation measuring 2.2 cm. This previously measured 1.4 cm. SUV max is 9.1 and was  previously 9.1. Bilateral iliac adenopathy is slightly larger. Left-sided node on image 104/6 measures 12 mm and previously measured 8 mm. SUV max is 7.8 and was previously 7.6. Right-sided nodal disease has an SUV max of 7.1 and this was not hypermetabolic prior study. Extensive bilateral external iliac adenopathy with definite progression. Left lateral external iliac node on image 122/6 measures 15 mm and previously measured 12 mm. SUV max is 8.7 and was previously 7.7. Right-sided node measures 17 mm and previously measured 8 mm. SUV max is 9.8 and was previously 4.9. Bilateral inguinal adenopathy also shows progression. Right upper lateral inguinal node measures 12 mm and previously measured 10 mm. SUV max is 9.0 and was previously 8.9. Dominant right-sided node measures 15 mm on image 143/6 and previously measured 12 mm. SUV max is 8.8 and was previously 9.2. Incidental CT findings: Large calcified uterine fibroid again noted. SKELETON: No findings suspicious for hypermetabolic osseous lymphoma. Incidental CT findings: No lytic or sclerotic bone lesions. IMPRESSION: 1. Overall mildly progressive hypermetabolic adenopathy in the neck, axilla, abdomen and pelvis. Deauville 5. 2. No findings for osseous lymphoma. 3. Hypermetabolism in the gallbladder could reflect chronic cholecystitis. Electronically Signed   By: MYRTIS Stammer M.D.   On: 01/03/2024 18:41

## 2024-01-05 NOTE — Addendum Note (Signed)
 Addended by: HEROLD ALMARIE PARAS on: 01/05/2024 02:02 PM   Modules accepted: Orders

## 2024-01-05 NOTE — Assessment & Plan Note (Signed)
 PET scan showed increase metabolic activity.  ? Chronic cholecystitis.  Refer to surgery for evaluation.

## 2024-01-05 NOTE — Assessment & Plan Note (Addendum)
 She has non drenching night sweats a few time per week.  She has no significantly worse constitutional symptoms.  PET scan images were reviewed and discussed with patient. Slight Progression of lymphadenopathy activity. Currently she does not meet GELF criteria for triggering treatment. Anticipate initiation of treatment in the near future, if she lost more than 10% of her weight, or has 3 nodal sites >3cm or other GELF criteria Repeat PET scan in 6 months   Currently on watchful waiting.

## 2024-01-12 ENCOUNTER — Telehealth: Payer: Self-pay

## 2024-01-12 NOTE — Telephone Encounter (Signed)
 Spoke to gen surgery checking on referral, Carly Roberts stated office called patient 2 times and left voicemail to schedule appointment

## 2024-02-06 ENCOUNTER — Other Ambulatory Visit: Payer: Self-pay | Admitting: General Surgery

## 2024-02-06 DIAGNOSIS — R1011 Right upper quadrant pain: Secondary | ICD-10-CM

## 2024-02-20 ENCOUNTER — Other Ambulatory Visit

## 2024-02-23 ENCOUNTER — Ambulatory Visit
Admission: RE | Admit: 2024-02-23 | Discharge: 2024-02-23 | Disposition: A | Discharge status: Home / Self Care | Source: Ambulatory Visit | Attending: General Surgery | Admitting: General Surgery

## 2024-02-23 DIAGNOSIS — R1011 Right upper quadrant pain: Secondary | ICD-10-CM | POA: Insufficient documentation

## 2024-02-28 ENCOUNTER — Ambulatory Visit

## 2024-03-13 ENCOUNTER — Other Ambulatory Visit: Payer: Self-pay | Admitting: Internal Medicine

## 2024-03-13 DIAGNOSIS — Z0000 Encounter for general adult medical examination without abnormal findings: Secondary | ICD-10-CM

## 2024-03-13 DIAGNOSIS — E782 Mixed hyperlipidemia: Secondary | ICD-10-CM

## 2024-03-15 ENCOUNTER — Ambulatory Visit: Payer: Self-pay | Admitting: General Surgery

## 2024-03-20 ENCOUNTER — Other Ambulatory Visit: Payer: Self-pay

## 2024-03-20 ENCOUNTER — Encounter
Admission: RE | Admit: 2024-03-20 | Discharge: 2024-03-20 | Disposition: A | Discharge status: Home / Self Care | Source: Ambulatory Visit | Attending: General Surgery | Admitting: General Surgery

## 2024-03-20 HISTORY — DX: Right upper quadrant pain: R10.11

## 2024-03-20 NOTE — Patient Instructions (Addendum)
 Your procedure is scheduled on: Friday 03/30/24  Report to the Registration Desk on the 1st floor of the [facility]. To find out your arrival time, please call [phone] between 1PM - 3PM on: Thursday 03/29/24  If your arrival time is 6:00 am, do not arrive before that time as the [facility] entrance doors do not open until 6:00 am.  REMEMBER: Instructions that are not followed completely may result in serious medical risk, up to and including death; or upon the discretion of your surgeon and anesthesiologist your surgery may need to be rescheduled.  Do not eat food or drink fluids after midnight the night before surgery.  No gum chewing or hard candies.  One week prior to surgery: Stop Anti-inflammatories (NSAIDS) such as Advil, Aleve, Ibuprofen, meloxicam (MOBIC), Motrin, Naproxen, Naprosyn and Aspirin based products such as Excedrin, Goody's Powder, BC Powder. Stop ANY OVER THE COUNTER supplements until after surgery. Cholecalciferol  (VITAMIN D3)  Melatonin 5 MG  Multiple Vitamins-Minerals (HAIR SKIN & NAILS) PO  RHUBARB-ASHWAGANDHA-MAGNESIUM  PO   You may however, continue to take Tylenol  if needed for pain up until the day of surgery.   Continue taking all of your other prescription medications up until the day of surgery.  ON THE DAY OF SURGERY ONLY TAKE THESE MEDICATIONS WITH SIPS OF WATER:  pantoprazole  (PROTONIX ) 40 MG    No Alcohol for 24 hours before or after surgery.  No Smoking including e-cigarettes for 24 hours before surgery.  No chewable tobacco products for at least 6 hours before surgery.  No nicotine patches on the day of surgery.  Do not use any recreational drugs for at least a week (preferably 2 weeks) before your surgery.  Please be advised that the combination of cocaine and anesthesia may have negative outcomes, up to and including death. If you test positive for cocaine, your surgery will be cancelled.  On the morning of surgery brush your  teeth with toothpaste and water, you may rinse your mouth with mouthwash if you wish. Do not swallow any toothpaste or mouthwash.  Use CHG Soap or wipes as directed on instruction sheet.  Do not wear jewelry, make-up, hairpins, clips or nail polish.  For welded (permanent) jewelry: bracelets, anklets, waist bands, etc.  Please have this removed prior to surgery.  If it is not removed, there is a chance that hospital personnel will need to cut it off on the day of surgery.  Do not wear lotions, powders, or perfumes.   Do not shave body hair from the neck down 48 hours before surgery.  Contact lenses, hearing aids and dentures may not be worn into surgery.  Do not bring valuables to the hospital. [facility] is not responsible for any missing/lost belongings or valuables.   Notify your doctor if there is any change in your medical condition (cold, fever, infection).  Wear comfortable clothing (specific to your surgery type) to the hospital.  After surgery, you can help prevent lung complications by doing breathing exercises.  Take deep breaths and cough every 1-2 hours. Your doctor may order a device called an Incentive Spirometer to help you take deep breaths. When coughing or sneezing, hold a pillow firmly against your incision with both hands. This is called "splinting." Doing this helps protect your incision. It also decreases belly discomfort.  If you are being admitted to the hospital overnight, leave your suitcase in the car. After surgery it may be brought to your room.  In case of increased patient  census, it may be necessary for you, the patient, to continue your postoperative care in the Same Day Surgery department.  If you are being discharged the day of surgery, you will not be allowed to drive home. You will need a responsible individual to drive you home and stay with you for 24 hours after surgery.   If you are taking public transportation, you will need to have a  responsible individual with you.  Please call the Pre-admissions Testing Dept. at [phone] if you have any questions about these instructions.  Surgery Visitation Policy:  Patients having surgery or a procedure may have two visitors.  Children under the age of 25 must have an adult with them who is not the patient.  Inpatient Visitation:    Visiting hours are 7 a.m. to 8 p.m. Up to four visitors are allowed at one time in a patient room. The visitors may rotate out with other people during the day.  One visitor age 75 or older may stay with the patient overnight and must be in the room by 8 p.m.   Merchandiser, Retail to address health-related social needs:  [email or web address]                                                                                                           Preparing for Surgery with CHLORHEXIDINE  GLUCONATE (CHG) Soap  Chlorhexidine  Gluconate (CHG) Soap  o An antiseptic cleaner that kills germs and bonds with the skin to continue killing germs even after washing  o Used for showering the night before surgery and morning of surgery  Before surgery, you can play an important role by reducing the number of germs on your skin.  CHG (Chlorhexidine  gluconate) soap is an antiseptic cleanser which kills germs and bonds with the skin to continue killing germs even after washing.  Please do not use if you have an allergy to CHG or antibacterial soaps. If your skin becomes reddened/irritated stop using the CHG.  1. Shower the NIGHT BEFORE SURGERY with CHG soap.  2. If you choose to wash your hair, wash your hair first as usual with your normal shampoo.  3. After shampooing, rinse your hair and body thoroughly to remove the shampoo.  4. Use CHG as you would any other liquid soap. You can apply CHG directly to the skin and wash gently with a clean washcloth.  5. Apply the CHG soap to your body only from the neck down. Do not use on open  wounds or open sores. Avoid contact with your eyes, ears, mouth, and genitals (private parts). Wash face and genitals (private parts) with your normal soap.  6. Wash thoroughly, paying special attention to the area where your surgery will be performed.  7. Thoroughly rinse your body with warm water.  8. Do not shower/wash with your normal soap after using and rinsing off the CHG soap.  9. Do not use lotions, oils, etc., after showering with CHG.  10. Pat yourself dry with a clean towel.  11. Wear clean pajamas to  bed the night before surgery.  12. Place clean sheets on your bed the night of your shower and do not sleep with pets.  13. Do not apply any deodorants/lotions/powders.  14. Please wear clean clothes to the hospital.  15. Remember to brush your teeth with your regular toothpaste.

## 2024-03-30 ENCOUNTER — Encounter
Admission: RE | Disposition: A | Discharge status: Home / Self Care | Payer: Self-pay | Source: Home / Self Care | Attending: General Surgery

## 2024-03-30 ENCOUNTER — Other Ambulatory Visit: Payer: Self-pay

## 2024-03-30 ENCOUNTER — Ambulatory Visit
Admission: RE | Admit: 2024-03-30 | Discharge: 2024-03-30 | Disposition: A | Discharge status: Home / Self Care | Attending: General Surgery | Admitting: General Surgery

## 2024-03-30 ENCOUNTER — Ambulatory Visit: Admitting: Anesthesiology

## 2024-03-30 ENCOUNTER — Encounter: Payer: Self-pay | Admitting: General Surgery

## 2024-03-30 ENCOUNTER — Ambulatory Visit: Payer: Self-pay | Admitting: Urgent Care

## 2024-03-30 DIAGNOSIS — Z8379 Family history of other diseases of the digestive system: Secondary | ICD-10-CM | POA: Diagnosis not present

## 2024-03-30 DIAGNOSIS — I10 Essential (primary) hypertension: Secondary | ICD-10-CM | POA: Insufficient documentation

## 2024-03-30 DIAGNOSIS — K811 Chronic cholecystitis: Secondary | ICD-10-CM | POA: Insufficient documentation

## 2024-03-30 DIAGNOSIS — M1990 Unspecified osteoarthritis, unspecified site: Secondary | ICD-10-CM | POA: Insufficient documentation

## 2024-03-30 DIAGNOSIS — C8290 Follicular lymphoma, unspecified, unspecified site: Secondary | ICD-10-CM | POA: Diagnosis not present

## 2024-03-30 DIAGNOSIS — T50995A Adverse effect of other drugs, medicaments and biological substances, initial encounter: Secondary | ICD-10-CM | POA: Diagnosis not present

## 2024-03-30 DIAGNOSIS — K219 Gastro-esophageal reflux disease without esophagitis: Secondary | ICD-10-CM | POA: Diagnosis not present

## 2024-03-30 DIAGNOSIS — K521 Toxic gastroenteritis and colitis: Secondary | ICD-10-CM | POA: Insufficient documentation

## 2024-03-30 HISTORY — PX: INDOCYANINE GREEN FLUORESCENCE IMAGING (ICG): SHX7595

## 2024-03-30 SURGERY — CHOLECYSTECTOMY, ROBOT-ASSISTED, LAPAROSCOPIC
Anesthesia: General | Site: Abdomen

## 2024-03-30 MED ORDER — OXYCODONE HCL 5 MG/5ML PO SOLN: 5.0000 mg | Freq: Once | ORAL | Status: AC | PRN

## 2024-03-30 MED ORDER — FENTANYL CITRATE (PF) 100 MCG/2ML IJ SOLN
25.0000 ug | INTRAMUSCULAR | Status: DC | PRN
  Administered 2024-03-30 (×3): 25 ug via INTRAVENOUS

## 2024-03-30 MED ORDER — CHLORHEXIDINE GLUCONATE 0.12 % MT SOLN
OROMUCOSAL | Status: AC
  Filled 2024-03-30: qty 15

## 2024-03-30 MED ORDER — EPHEDRINE SULFATE-NACL 50-0.9 MG/10ML-% IV SOSY
PREFILLED_SYRINGE | INTRAVENOUS | Status: DC | PRN
  Administered 2024-03-30: 5 mg via INTRAVENOUS

## 2024-03-30 MED ORDER — ROCURONIUM BROMIDE 100 MG/10ML IV SOLN
INTRAVENOUS | Status: DC | PRN
  Administered 2024-03-30: 40 mg via INTRAVENOUS

## 2024-03-30 MED ORDER — CEFAZOLIN SODIUM-DEXTROSE 2-4 GM/100ML-% IV SOLN
2.0000 g | INTRAVENOUS | Status: AC
  Administered 2024-03-30: 2 g via INTRAVENOUS

## 2024-03-30 MED ORDER — MIDAZOLAM HCL 2 MG/2ML IJ SOLN
INTRAMUSCULAR | Status: AC
  Filled 2024-03-30: qty 2

## 2024-03-30 MED ORDER — LIDOCAINE HCL (PF) 2 % IJ SOLN
INTRAMUSCULAR | Status: AC
  Filled 2024-03-30: qty 5

## 2024-03-30 MED ORDER — ONDANSETRON HCL 4 MG/2ML IJ SOLN
INTRAMUSCULAR | Status: DC | PRN
Start: 2024-03-30 — End: 2024-03-30
  Administered 2024-03-30: 4 mg via INTRAVENOUS

## 2024-03-30 MED ORDER — BUPIVACAINE-EPINEPHRINE (PF) 0.25% -1:200000 IJ SOLN
INTRAMUSCULAR | Status: AC
  Filled 2024-03-30: qty 30

## 2024-03-30 MED ORDER — OXYCODONE HCL 5 MG PO TABS
ORAL_TABLET | ORAL | Status: AC
  Filled 2024-03-30: qty 1

## 2024-03-30 MED ORDER — INDOCYANINE GREEN 25 MG IV SOLR
1.2500 mg | Freq: Once | INTRAVENOUS | Status: AC
  Administered 2024-03-30: 1.25 mg via INTRAVENOUS

## 2024-03-30 MED ORDER — FENTANYL CITRATE (PF) 100 MCG/2ML IJ SOLN
INTRAMUSCULAR | Status: DC | PRN
  Administered 2024-03-30 (×2): 50 ug via INTRAVENOUS

## 2024-03-30 MED ORDER — FENTANYL CITRATE (PF) 100 MCG/2ML IJ SOLN
INTRAMUSCULAR | Status: AC
  Filled 2024-03-30: qty 2

## 2024-03-30 MED ORDER — PROPOFOL 10 MG/ML IV BOLUS
INTRAVENOUS | Status: DC | PRN
  Administered 2024-03-30: 40 mg via INTRAVENOUS
  Administered 2024-03-30: 100 mg via INTRAVENOUS

## 2024-03-30 MED ORDER — SUGAMMADEX SODIUM 500 MG/5ML IV SOLN
INTRAVENOUS | Status: DC | PRN
  Administered 2024-03-30: 200 mg via INTRAVENOUS

## 2024-03-30 MED ORDER — ACETAMINOPHEN 10 MG/ML IV SOLN
INTRAVENOUS | Status: DC | PRN
  Administered 2024-03-30: 1000 mg via INTRAVENOUS

## 2024-03-30 MED ORDER — BUPIVACAINE-EPINEPHRINE 0.25% -1:200000 IJ SOLN
INTRAMUSCULAR | Status: DC | PRN
  Administered 2024-03-30: 30 mL

## 2024-03-30 MED ORDER — LIDOCAINE HCL (CARDIAC) PF 100 MG/5ML IV SOSY
PREFILLED_SYRINGE | INTRAVENOUS | Status: DC | PRN
  Administered 2024-03-30: 60 mg via INTRAVENOUS

## 2024-03-30 MED ORDER — DEXMEDETOMIDINE HCL IN NACL 400 MCG/100ML IV SOLN
INTRAVENOUS | Status: DC | PRN
  Administered 2024-03-30: 4 ug via INTRAVENOUS

## 2024-03-30 MED ORDER — KETAMINE HCL 50 MG/5ML IJ SOSY
PREFILLED_SYRINGE | INTRAMUSCULAR | Status: DC | PRN
  Administered 2024-03-30: 20 mg via INTRAVENOUS

## 2024-03-30 MED ORDER — ACETAMINOPHEN 10 MG/ML IV SOLN
INTRAVENOUS | Status: AC
  Filled 2024-03-30: qty 100

## 2024-03-30 MED ORDER — ORAL CARE MOUTH RINSE: 15.0000 mL | Freq: Once | OROMUCOSAL | Status: AC

## 2024-03-30 MED ORDER — DROPERIDOL 2.5 MG/ML IJ SOLN
0.6250 mg | Freq: Once | INTRAMUSCULAR | Status: AC
  Administered 2024-03-30: 0.625 mg via INTRAVENOUS

## 2024-03-30 MED ORDER — HYDROCODONE-ACETAMINOPHEN 5-325 MG PO TABS
1.0000 | ORAL_TABLET | Freq: Four times a day (QID) | ORAL | 0 refills | Status: AC | PRN
  Filled 2024-03-30: qty 12, 3d supply, fill #0

## 2024-03-30 MED ORDER — CEFAZOLIN SODIUM-DEXTROSE 2-4 GM/100ML-% IV SOLN
INTRAVENOUS | Status: AC
  Filled 2024-03-30: qty 100

## 2024-03-30 MED ORDER — DEXAMETHASONE SOD PHOSPHATE PF 10 MG/ML IJ SOLN
INTRAMUSCULAR | Status: DC | PRN
  Administered 2024-03-30: 10 mg via INTRAVENOUS

## 2024-03-30 MED ORDER — ROCURONIUM BROMIDE 10 MG/ML (PF) SYRINGE
PREFILLED_SYRINGE | INTRAVENOUS | Status: AC
  Filled 2024-03-30: qty 10

## 2024-03-30 MED ORDER — KETAMINE HCL 50 MG/5ML IJ SOSY
PREFILLED_SYRINGE | INTRAMUSCULAR | Status: AC
  Filled 2024-03-30: qty 5

## 2024-03-30 MED ORDER — PROPOFOL 10 MG/ML IV BOLUS
INTRAVENOUS | Status: AC
  Filled 2024-03-30: qty 20

## 2024-03-30 MED ORDER — MIDAZOLAM HCL (PF) 2 MG/2ML IJ SOLN
INTRAMUSCULAR | Status: DC | PRN
  Administered 2024-03-30: 2 mg via INTRAVENOUS

## 2024-03-30 MED ORDER — LACTATED RINGERS IV SOLN: INTRAVENOUS | Status: DC

## 2024-03-30 MED ORDER — ONDANSETRON HCL 4 MG/2ML IJ SOLN
INTRAMUSCULAR | Status: AC
  Filled 2024-03-30: qty 2

## 2024-03-30 MED ORDER — DROPERIDOL 2.5 MG/ML IJ SOLN
INTRAMUSCULAR | Status: AC
  Filled 2024-03-30: qty 2

## 2024-03-30 MED ORDER — CHLORHEXIDINE GLUCONATE 0.12 % MT SOLN
15.0000 mL | Freq: Once | OROMUCOSAL | Status: AC
  Administered 2024-03-30: 15 mL via OROMUCOSAL

## 2024-03-30 MED ORDER — OXYCODONE HCL 5 MG PO TABS
5.0000 mg | ORAL_TABLET | Freq: Once | ORAL | Status: AC | PRN
  Administered 2024-03-30: 5 mg via ORAL

## 2024-03-30 SURGICAL SUPPLY — 41 items
BAG PRESSURE INF REUSE 1000 (BAG) IMPLANT
CANNULA REDUCER 12-8 DVNC XI (CANNULA) ×2 IMPLANT
CAUTERY HOOK MNPLR 1.6 DVNC XI (INSTRUMENTS) ×2 IMPLANT
CLIP LIGATING HEM O LOK PURPLE (MISCELLANEOUS) IMPLANT
CLIP LIGATING HEMO O LOK GREEN (MISCELLANEOUS) ×2 IMPLANT
DEFOGGER SCOPE WARM SEASHARP (MISCELLANEOUS) ×2 IMPLANT
DERMABOND ADVANCED .7 DNX12 (GAUZE/BANDAGES/DRESSINGS) ×2 IMPLANT
DRAPE ARM DVNC X/XI (DISPOSABLE) ×8 IMPLANT
DRAPE C-ARM XRAY 36X54 (DRAPES) IMPLANT
DRAPE COLUMN DVNC XI (DISPOSABLE) ×2 IMPLANT
ELECTRODE REM PT RTRN 9FT ADLT (ELECTROSURGICAL) ×2 IMPLANT
FORCEPS BPLR 8 MD DVNC XI (FORCEP) IMPLANT
FORCEPS BPLR FENES DVNC XI (FORCEP) ×2 IMPLANT
FORCEPS PROGRASP DVNC XI (FORCEP) ×2 IMPLANT
GLOVE BIO SURGEON STRL SZ 6.5 (GLOVE) ×4 IMPLANT
GLOVE BIOGEL PI IND STRL 6.5 (GLOVE) ×4 IMPLANT
GLOVE SURG SYN 6.5 PF PI (GLOVE) ×4 IMPLANT
GOWN STRL REUS W/ TWL LRG LVL3 (GOWN DISPOSABLE) ×8 IMPLANT
GRASPER SUT TROCAR 14GX15 (MISCELLANEOUS) ×2 IMPLANT
IRRIGATOR SUCT 8 DISP DVNC XI (IRRIGATION / IRRIGATOR) IMPLANT
IV 0.9% NACL 1000 ML (IV SOLUTION) IMPLANT
IV CATH ANGIO 12GX3 LT BLUE (NEEDLE) IMPLANT
KIT PINK PAD W/HEAD ARM REST (MISCELLANEOUS) ×2 IMPLANT
LABEL OR SOLS (LABEL) ×2 IMPLANT
MANIFOLD NEPTUNE II (INSTRUMENTS) IMPLANT
NDL HYPO 22X1.5 SAFETY MO (MISCELLANEOUS) ×2 IMPLANT
NDL INSUFFLATION 14GA 120MM (NEEDLE) ×2 IMPLANT
NEEDLE HYPO 22X1.5 SAFETY MO (MISCELLANEOUS) ×2 IMPLANT
NEEDLE INSUFFLATION 14GA 120MM (NEEDLE) ×2 IMPLANT
NS IRRIG 500ML POUR BTL (IV SOLUTION) ×2 IMPLANT
OBTURATOR OPTICALSTD 8 DVNC (TROCAR) ×2 IMPLANT
PACK LAP CHOLECYSTECTOMY (MISCELLANEOUS) ×2 IMPLANT
SEAL UNIV 5-12 XI (MISCELLANEOUS) ×8 IMPLANT
SET TUBE SMOKE EVAC HIGH FLOW (TUBING) ×2 IMPLANT
SOLUTION ELECTROSURG ANTI STCK (MISCELLANEOUS) ×2 IMPLANT
SPIKE FLUID TRANSFER (MISCELLANEOUS) ×4 IMPLANT
SPONGE T-LAP 4X18 ~~LOC~~+RFID (SPONGE) IMPLANT
SUT VICRYL 0 UR6 27IN ABS (SUTURE) ×2 IMPLANT
SUTURE MNCRL 4-0 27XMF (SUTURE) ×2 IMPLANT
SYSTEM BAG RETRIEVAL 10MM (BASKET) ×2 IMPLANT
WATER STERILE IRR 500ML POUR (IV SOLUTION) ×2 IMPLANT

## 2024-03-30 NOTE — Op Note (Signed)
 Preoperative diagnosis: Chronic cholecystitis   Postoperative diagnosis: Chronic cholecystitis   Procedure: Robotic Assisted Laparoscopic Cholecystectomy.   Anesthesia: GETA   Surgeon: Dr. Cesar Coe  Wound Classification: Clean Contaminated  Indications: Patient is a 58 y.o. female developed right upper quadrant pain and on workup was found to have cholelithiasis with chronic cholecystitis. Robotic Assisted Laparoscopic cholecystectomy was elected.  Findings:  Critical view of safety achieved Cystic duct and artery identified, ligated and divided Adequate hemostasis  Description of procedure: The patient was placed on the operating table in the supine position. General anesthesia was induced. A time-out was completed verifying correct patient, procedure, site, positioning, and implant(s) and/or special equipment prior to beginning this procedure. An orogastric tube was placed. The abdomen was prepped and draped in the usual sterile fashion.  An incision was made in a natural skin line below the umbilicus.  The fascia was elevated and the Veress needle inserted. Proper position was confirmed by aspiration and saline meniscus test.  The abdomen was insufflated with carbon dioxide to a pressure of 15 mmHg. The patient tolerated insufflation well. A 8-mm trocar was then inserted in optiview fashion.  The laparoscope was inserted and the abdomen inspected. No injuries from initial trocar placement were noted. Additional trocars were then inserted in the following locations: an 8-mm trocar in the left lateral abdomen, and another two 8-mm trocars to the right side of the abdomen 5 cm appart. The umbilical trocar was changed to a 12 mm trocar all under direct visualization. The abdomen was inspected and no abnormalities were found. The table was placed in the reverse Trendelenburg position with the right side up. The robotic arms were docked and target anatomy identified. Instrument inserted  under direct visualization.  Filmy adhesions between the gallbladder and omentum, duodenum and transverse colon were lysed with electrocautery. The dome of the gallbladder was grasped with a prograsp and retracted over the dome of the liver. The infundibulum was also grasped with an atraumatic grasper and retracted toward the right lower quadrant. This maneuver exposed Calot's triangle. The peritoneum overlying the gallbladder infundibulum was then incised and the cystic duct and cystic artery identified and circumferentially dissected. Critical view of safety reviewed before ligating any structure. Firefly images taken to visualize biliary ducts. The cystic duct and cystic artery were then doubly clipped and divided close to the gallbladder.  The gallbladder was then dissected from its peritoneal attachments by electrocautery. Hemostasis was checked and the gallbladder and contained stones were removed using an endoscopic retrieval bag. The gallbladder was passed off the table as a specimen. There was no evidence of bleeding from the gallbladder fossa or cystic artery or leakage of the bile from the cystic duct stump. Secondary trocars were removed under direct vision. No bleeding was noted. The robotic arms were undoked. The scope was withdrawn and the umbilical trocar removed. The abdomen was allowed to collapse. The fascia of the 12mm trocar sites was closed with figure-of-eight 0 vicryl sutures. The skin was closed with subcuticular sutures of 4-0 monocryl and topical skin adhesive. The orogastric tube was removed.  The patient tolerated the procedure well and was taken to the postanesthesia care unit in stable condition.   Specimen: Gallbladder  Complications: None  EBL: 5 mL

## 2024-03-30 NOTE — H&P (Signed)
 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  She experiences right upper quadrant pain, described as occasional and not constant, typically triggered by eating beef or consuming sugary drinks, especially those with artificial sweeteners. The pain is localized under her rib cage and does not radiate to her back, shoulder, or other areas. It can last from hours to a couple of days, but more recently, it occurs about once a week and lasts for about an hour. She does not take any medication for the pain as it is not severe enough to warrant it.  A routine PET scan for follow-up of her follicular lymphoma showed hypermetabolism in the gallbladder area. She has not had an ultrasound or CT scan to further evaluate the gallbladder. She reports a family history of gallbladder issues, as her sister had her gallbladder removed approximately ten years ago, although she is unsure if stones were involved.  She is currently taking Otezla for arthritis, which causes daily diarrhea. She has a history of heartburn and was previously treated for it, but she notes that the burning sensation she experiences is different from her current pain. Her past medical history includes an endoscopy and colonoscopy in 2016, which diagnosed colitis. She has had 'lady's part surgery.' No lower abdominal pain and her pain does not improve with bowel movements.   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 O\bservation at present.  GERD (gastroesophageal reflux disease) 2019  Take pantoprazole   History of abnormal cervical Pap smear  Hypertensive disorder 01/31/2020   Lymphocytic colitis 04/2018  Identified on random biopsies during colonoscopy for diarrhea.  Murmur  Osteoarthritis     PAST SURGICAL HISTORY:  Past Surgical History:  Procedure Laterality Date  MASTECTOMY PARTIAL / LUMPECTOMY Right 2006  COLONOSCOPY 2013  ASPIRATION CYST BREAST Bilateral 2017  lymphnode biopsy 11/2016  right groin  COLONOSCOPY 04/06/2018  Lymphocytic Colitis, identified on random biopsy for diarrhea: [facility]BF 03/2019 Recall ltr mailed  EGD 04/06/2018  Gastritis: No repeat  [facility] 2021  JOINT REPLACEMENT Right 04/03/2020  hip  ARTHROPLASTY HIP TOTAL Right 04/03/2020  [facility]  PERCUTANEOUS BIOPSY BREAST Right 07/01/2021  Atypical lobular hyperplasia in area of microcalcification.  breast biopsy with wire localization Right 08/28/2021  Wide excision for identification of atypical lobular hyperplasia on stereotactic biopsy. No upstaging to invasive cancer.  DILATION AND CURETTAGE, DIAGNOSTIC / THERAPEUTIC  HYSTEROSCOPY  wisdom teeth extraction    MEDICATIONS:  Outpatient Encounter Medications as of 02/02/2024  Medication Sig Dispense Refill  acetaminophen  (TYLENOL ) 500 MG tablet Take by mouth every 8 (eight) hours as needed  cholecalciferol  (VITAMIN D3) 1000 unit capsule Take 1,000 Units by mouth once daily  magnesium  200 mg Take by mouth once daily  melatonin 5 mg Cap Take by mouth at bedtime as needed  meloxicam (MOBIC) 15 MG tablet Take 1 tablet (15 mg total) by mouth once daily as needed  OTEZLA 30 mg tablet Take 1 tablet (30 mg total) by mouth 2 (two) times daily 180 tablet 1  pantoprazole  (PROTONIX ) 40 MG DR tablet TAKE 1 TABLET BY MOUTH EVERY DAY 90 tablet 1  gabapentin (NEURONTIN) 300 MG capsule TAKE 1  CAPSULE BY MOUTH EVERYDAY AT BEDTIME (Patient not taking: Reported on 02/02/2024) 90 capsule 1   No facility-administered encounter medications on file as of 02/02/2024.    ALLERGIES:  Acrylates/beheneth-25 methacrylate copolymer, Merthiolate (benzalkonium)  [benzalkonium chloride], Sulfa (sulfonamide antibiotics), Sulfasalazine, and Thimerosal  SOCIAL HISTORY:  Social History   Socioeconomic History  Marital status: Married  Number of children: 2  Occupational History  Occupation: [employer]  Tobacco Use  Smoking status: Never  Smokeless tobacco: Never  Vaping Use  Vaping status: Never Used  Substance and Sexual Activity  Alcohol use: No  Drug use: No  Sexual activity: Defer   Social Drivers of Corporate Investment Banker Strain: Low Risk (11/07/2023)  Overall Financial Resource Strain (CARDIA)  Difficulty of Paying Living Expenses: Not hard at all  Food Insecurity: No Food Insecurity (11/07/2023)  Hunger Vital Sign  Worried About Running Out of Food in the Last Year: Never true  Ran Out of Food in the Last Year: Never true  Transportation Needs: No Transportation Needs (11/07/2023)  PRAPARE - Risk Analyst (Medical): No  Lack of Transportation (Non-Medical): No   FAMILY HISTORY:  Family History  Problem Relation Name Age of Onset  Multiple sclerosis Mother  Hyperlipidemia (Elevated cholesterol) Father  High blood pressure (Hypertension) Father  Myocardial Infarction (Heart attack) Father  Heart disease Father  Arthritis Sister  Rheum arthritis Sister  Psoriasis Sister  psoriatic arthritis  Lung cancer Maternal Aunt  Lung cancer Maternal Uncle  Alcohol abuse Maternal Uncle  Diabetes Maternal Grandmother  High blood pressure (Hypertension) Maternal Grandmother  Stroke Maternal Grandmother  Kidney disease Maternal Grandmother  High blood pressure (Hypertension) Paternal Grandfather  Colon cancer Paternal Grandfather  Breast cancer Neg Hx    GENERAL REVIEW OF SYSTEMS:   General ROS: negative for - chills, fatigue, fever, weight gain or weight loss Allergy and Immunology ROS: negative for - hives  Hematological and Lymphatic ROS: negative for - bleeding problems or bruising, negative for palpable  nodes Endocrine ROS: negative for - heat or cold intolerance, hair changes Respiratory ROS: negative for - cough, shortness of breath or wheezing Cardiovascular ROS: no chest pain or palpitations GI ROS: negative for nausea, vomiting, positive for abdominal pain Musculoskeletal ROS: negative for - joint swelling or muscle pain Neurological ROS: negative for - confusion, syncope Dermatological ROS: negative for pruritus and rash  PHYSICAL EXAM:   Vitals:   03/30/24 0626  BP: (!) 129/53  Pulse: (!) 58  Resp: 16  Temp: (!) 97.3 F (36.3 C)  SpO2: 100%     GENERAL: Alert, active, oriented x3  HEENT: Pupils equal reactive to light. Extraocular movements are intact. Sclera clear. Palpebral conjunctiva normal red color.Pharynx clear.  NECK: Supple with no palpable mass and no adenopathy.  LUNGS: Sound clear with no rales rhonchi or wheezes.  HEART: Regular rhythm S1 and S2 without murmur.  ABDOMEN: Soft and depressible, nontender with no palpable mass, no hepatomegaly.   EXTREMITIES: Well-developed well-nourished symmetrical with no dependent edema.  NEUROLOGICAL: Awake alert oriented, facial expression symmetrical, moving all extremities.  Results RADIOLOGY (I personally evaluated this images). PET scan: Hypermetabolism in gallbladder, no obvious pathology, suggests chronic cholecystitis.  Assessment & Plan Right upper quadrant abdominal pain with chronic cholecystitis  Intermittent right upper quadrant abdominal pain likely stems from gallbladder dysfunction, triggered by foods like beef and artificial sweeteners, occurring weekly. A PET scan indicated hypermetabolism in the gallbladder, suggesting chronic cholecystitis. Differential diagnosis includes gallstones, gallbladder dysfunction without  stones, or other gastrointestinal issues such as ulcers or gastritis. Order an abdominal ultrasound to assess for gallstones and gallbladder wall thickness. If the ultrasound does not  reveal stones, order a HIDA scan to evaluate gallbladder function. Refer to gastroenterology if ultrasound and HIDA scan do not explain symptoms. Discuss surgical options if gallstones or gallbladder dysfunction are confirmed.  Abdominal ultrasound confirms gallstones which makes chronic cholecystitis most likely cause of her RUQ pain. Patient oriented about plan of cholecystectomy. Oriented about risks including bleeding, infection, bile leak, injury to biliary ducts, retained stones, injury to adjacent organs, among others. Patient reports understood and agreed to proceed.   Right upper quadrant pain [R10.11]   Patient and her husband verbalized understanding, all questions were answered, and were agreeable with the plan outlined above.   Lucas Sjogren, MD

## 2024-03-30 NOTE — Transfer of Care (Signed)
 Immediate Anesthesia Transfer of Care Note  Patient: Carly Roberts  Procedure(s) Performed: CHOLECYSTECTOMY, ROBOT-ASSISTED, LAPAROSCOPIC (Abdomen) INDOCYANINE GREEN FLUORESCENCE IMAGING (ICG)  Patient Location: PACU  Anesthesia Type:General  Level of Consciousness: awake, alert , and oriented  Airway & Oxygen Therapy: Patient Spontanous Breathing and Patient connected to face mask oxygen  Post-op Assessment: Report given to RN and Post -op Vital signs reviewed and stable  Post vital signs: stable  Last Vitals:  Vitals Value Taken Time  BP 121/73 03/30/24 08:34  Temp 36.1 C 03/30/24 08:33  Pulse 81 03/30/24 08:38  Resp 22 03/30/24 08:38  SpO2 100 % 03/30/24 08:38  Vitals shown include unfiled device data.  Last Pain:  Vitals:   03/30/24 0833  TempSrc:   PainSc: 0-No pain         Complications: No notable events documented.

## 2024-03-30 NOTE — Anesthesia Procedure Notes (Signed)
 Procedure Name: Intubation Date/Time: 03/30/2024 7:35 AM  Performed by: Gillermo Spruce I, CRNAPre-anesthesia Checklist: Patient identified, Emergency Drugs available, Suction available and Patient being monitored Patient Re-evaluated:Patient Re-evaluated prior to induction Oxygen Delivery Method: Circle system utilized Preoxygenation: Pre-oxygenation with 100% oxygen Induction Type: IV induction Ventilation: Mask ventilation without difficulty Laryngoscope Size: McGrath and 3 Grade View: Grade I Tube type: Oral Tube size: 7.0 mm Number of attempts: 1 Airway Equipment and Method: Stylet and Oral airway Placement Confirmation: ETT inserted through vocal cords under direct vision, positive ETCO2 and breath sounds checked- equal and bilateral Secured at: 21 cm Tube secured with: Tape Dental Injury: Teeth and Oropharynx as per pre-operative assessment

## 2024-03-30 NOTE — Anesthesia Postprocedure Evaluation (Signed)
 Anesthesia Post Note  Patient: Carly Roberts  Procedure(s) Performed: CHOLECYSTECTOMY, ROBOT-ASSISTED, LAPAROSCOPIC (Abdomen) INDOCYANINE GREEN FLUORESCENCE IMAGING (ICG)  Patient location during evaluation: PACU Anesthesia Type: General Level of consciousness: awake and alert Pain management: pain level controlled Vital Signs Assessment: post-procedure vital signs reviewed and stable Respiratory status: spontaneous breathing, nonlabored ventilation, respiratory function stable and patient connected to nasal cannula oxygen Cardiovascular status: blood pressure returned to baseline and stable Postop Assessment: no apparent nausea or vomiting Anesthetic complications: no   No notable events documented.   Last Vitals:  Vitals:   03/30/24 0934 03/30/24 1006  BP: 139/69 (!) 135/57  Pulse: (!) 54 64  Resp: 16 16  Temp: (!) 36.1 C   SpO2: 99% 95%    Last Pain:  Vitals:   03/30/24 0934  TempSrc: Temporal  PainSc: 4                  Debby Mines

## 2024-03-30 NOTE — Anesthesia Preprocedure Evaluation (Signed)
 Anesthesia Evaluation  Patient identified by MRN, date of birth, ID band Patient awake    Reviewed: Allergy & Precautions, NPO status , Patient's Chart, lab work & pertinent test results  History of Anesthesia Complications Negative for: history of anesthetic complications  Airway Mallampati: III  TM Distance: >3 FB Neck ROM: full    Dental no notable dental hx. (+) Chipped   Pulmonary neg pulmonary ROS, neg sleep apnea, neg COPD, Not current smoker   Pulmonary exam normal        Cardiovascular Exercise Tolerance: Good hypertension, (-) Past MI and (-) CHF Normal cardiovascular exam(-) dysrhythmias + Valvular Problems/Murmurs (as a child, no problems)  Rhythm:Regular Rate:Normal     Neuro/Psych neg Seizures PSYCHIATRIC DISORDERS Anxiety Depression    negative neurological ROS     GI/Hepatic negative GI ROS, Neg liver ROS,GERD  Medicated and Controlled,,  Endo/Other  negative endocrine ROSneg diabetes    Renal/GU      Musculoskeletal   Abdominal   Peds  Hematology negative hematology ROS (+) Blood dyscrasia, anemia   Anesthesia Other Findings Past Medical History: No date: Anemia No date: Anxiety and depression No date: Chicken pox 2011: Fibrocystic breast 2018: Follicular non-Hodgkin lymphoma (HCC) No date: GERD (gastroesophageal reflux disease) No date: Heart murmur No date: Hypertension 04/2018: Lymphocytic colitis No date: Menorrhagia No date: Osteoarthritis No date: Right upper quadrant pain No date: Uterine fibroid  Past Surgical History: 2008: ABLATION     Comment:  endometrial ablation 07/01/2021: BREAST BIOPSY; Right     Comment:  X Clip-ALH 08/28/2021: BREAST BIOPSY; Right     Comment:  Procedure: BREAST BIOPSY WITH NEEDLE LOCALIZATION;                Surgeon: Dessa Reyes ORN, MD;  Location: [facility];                Service: General;  Laterality: Right; 12/31/2015: BREAST CYST ASPIRATION;  Bilateral     Comment:  FNA done by Dr. Dessa 2005: BREAST EXCISIONAL BIOPSY; Right     Comment:  Fibroadenoma, duct adenoma, right breast or o'clock. 08/25/2011: COLONOSCOPY     Comment:  removed 3 mm polyp Dr. Viktoria 04/2018: COLONOSCOPY     Comment:  lymphocytic colitis No date: DILATION AND CURETTAGE OF UTERUS No date: HYSTEROSCOPY 2018: LYMPH NODE BIOPSY 04/03/2020: TOTAL HIP ARTHROPLASTY; Right     Comment:  Procedure: TOTAL HIP ARTHROPLASTY ANTERIOR APPROACH;                Surgeon: Kathlynn Sharper, MD;  Location: [facility];                Service: Orthopedics;  Laterality: Right; 09/2019: UTERINE FIBROID SURGERY No date: WISDOM TOOTH EXTRACTION  BMI    Body Mass Index: 23.41 kg/m      Reproductive/Obstetrics negative OB ROS                              Anesthesia Physical Anesthesia Plan  ASA: 2  Anesthesia Plan: General ETT   Post-op Pain Management:    Induction: Intravenous  PONV Risk Score and Plan: 3 and Ondansetron , Dexamethasone  and Midazolam   Airway Management Planned: Oral ETT  Additional Equipment:   Intra-op Plan:   Post-operative Plan: Extubation in OR  Informed Consent: I have reviewed the patients History and Physical, chart, labs and discussed the procedure including the risks, benefits and alternatives for the proposed anesthesia with the  patient or authorized representative who has indicated his/her understanding and acceptance.     Dental Advisory Given  Plan Discussed with: Anesthesiologist, CRNA and Surgeon  Anesthesia Plan Comments: (Patient consented for risks of anesthesia including but not limited to:  - adverse reactions to medications - damage to eyes, teeth, lips or other oral mucosa - nerve damage due to positioning  - sore throat or hoarseness - Damage to heart, brain, nerves, lungs, other parts of body or loss of life  Patient voiced understanding and assent.)        Anesthesia Quick  Evaluation

## 2024-03-30 NOTE — Discharge Instructions (Addendum)

## 2024-03-31 ENCOUNTER — Encounter: Payer: Self-pay | Admitting: General Surgery

## 2024-04-02 LAB — SURGICAL PATHOLOGY

## 2024-05-28 ENCOUNTER — Telehealth: Payer: Self-pay | Admitting: *Deleted

## 2024-05-28 ENCOUNTER — Other Ambulatory Visit: Payer: Self-pay

## 2024-05-28 DIAGNOSIS — C8298 Follicular lymphoma, unspecified, lymph nodes of multiple sites: Secondary | ICD-10-CM

## 2024-05-28 NOTE — Telephone Encounter (Signed)
 Patient returned [place] phone call. She has decided to keep the apts for the pet scan as scheduled.

## 2024-05-28 NOTE — Telephone Encounter (Signed)
 Patient called triage and left vm. She would like to change her pet scan to outpatient- [place] [facility] location to get a cheaper cost on pet scan. Scan is currently scheduled on 2/16. Pt is requesting call back to discuss further.

## 2024-05-28 NOTE — Telephone Encounter (Signed)
Dr Yu patient?

## 2024-07-02 ENCOUNTER — Ambulatory Visit

## 2024-07-09 ENCOUNTER — Ambulatory Visit: Admitting: Oncology

## 2024-07-09 ENCOUNTER — Other Ambulatory Visit
